# Patient Record
Sex: Male | Born: 1995 | Race: White | Hispanic: No | Marital: Single | Smoking: Former smoker
Health system: Southern US, Community
[De-identification: ages and names within clinical notes are randomized; demographics above are authoritative.]

## PROBLEM LIST (undated history)

## (undated) DIAGNOSIS — J302 Other seasonal allergic rhinitis: Secondary | ICD-10-CM

## (undated) DIAGNOSIS — J45909 Unspecified asthma, uncomplicated: Secondary | ICD-10-CM

## (undated) DIAGNOSIS — T7840XA Allergy, unspecified, initial encounter: Secondary | ICD-10-CM

## (undated) DIAGNOSIS — F419 Anxiety disorder, unspecified: Secondary | ICD-10-CM

## (undated) HISTORY — DX: Allergy, unspecified, initial encounter: T78.40XA

## (undated) HISTORY — DX: Unspecified asthma, uncomplicated: J45.909

## (undated) HISTORY — DX: Other seasonal allergic rhinitis: J30.2

## (undated) HISTORY — PX: OTHER SURGICAL HISTORY: SHX169

---
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
## 2014-02-04 ENCOUNTER — Ambulatory Visit: Payer: Self-pay | Admitting: Family Medicine

## 2014-02-17 ENCOUNTER — Encounter: Payer: Self-pay | Admitting: Family Medicine

## 2014-03-19 ENCOUNTER — Encounter: Payer: Self-pay | Admitting: Family Medicine

## 2014-07-01 ENCOUNTER — Ambulatory Visit: Payer: Self-pay | Admitting: Neurology

## 2014-07-07 ENCOUNTER — Encounter: Payer: Self-pay | Admitting: *Deleted

## 2014-07-08 ENCOUNTER — Encounter: Payer: Self-pay | Admitting: Neurology

## 2014-07-08 ENCOUNTER — Ambulatory Visit (INDEPENDENT_AMBULATORY_CARE_PROVIDER_SITE_OTHER): Payer: 59 | Admitting: Neurology

## 2014-07-08 VITALS — BP 118/80 | HR 72 | Ht 72.0 in | Wt 146.2 lb

## 2014-07-08 DIAGNOSIS — M25532 Pain in left wrist: Secondary | ICD-10-CM

## 2014-07-08 DIAGNOSIS — M79601 Pain in right arm: Secondary | ICD-10-CM

## 2014-07-08 DIAGNOSIS — M79602 Pain in left arm: Secondary | ICD-10-CM

## 2014-07-08 DIAGNOSIS — Z79899 Other long term (current) drug therapy: Secondary | ICD-10-CM

## 2014-07-08 DIAGNOSIS — M25531 Pain in right wrist: Secondary | ICD-10-CM

## 2014-07-08 NOTE — Patient Instructions (Signed)
1.  Stop gabapentin  2.  We will send a referral to Dr. Hulan Saas for ?tendonitis 3.  Recommend you take a a break from playing guitar and video games 4.  Return to clinic as needed

## 2014-07-08 NOTE — Progress Notes (Signed)
Note faxed.

## 2014-07-08 NOTE — Progress Notes (Signed)
[facility] Clinic Note - Initial Visit   Date: 07/08/2014  ALPHA MYSLIWIEC MRN: [id number] DOB: 1996-04-21   Dear Dr. Melrose Nakayama:  Thank you for your kind referral of Stuart Harper for consultation of bilateral arm pain. Although his history is well known to you, please allow [name] to reiterate it for the purpose of our medical record. The patient was accompanied to the clinic by self.    History of Present Illness: Stuart Harper is a 18 y.o. right-handed Caucasian male with history of seasonal allergies presenting for evaluation of bilateral arm pain, worse on the left.    Late March 2015, he woke up with pain in his left arm around the antecubital fossa, which was exacerbated in elbow flexion, such as when playing video games or guitar.  Over the time, he developed the same symptoms on the right hand.  He tried to avoid these activities, but there was no improvement.  [facility] wrist splint and given prednisone taper with no relief.  He started taking ibuprofen daily.  He was also orthopaedics at [facility] whose clinical exam was normal.  He complains of dull pain over the fingers and wrist, which is all worse when doing repetitive motion (video games).  Rest improves pain.  There is no associated numbness/tingling, muscle stiffness, cramping, or neck pain.  He feels that his hands have become weaker and needs to be careful when manipulating things.  Heat and ice provided temporary relief.  Prior to symptom onset, he was previously playing video games for 3-4 hours on average every other day and playing the guitar every Sunday and Thursday (1.5 hr).   Over the summer, he started working out again and did not notice any worsening of symptoms.  In June, he was started on gabapentin for trial by Dr. Tamala Julian (ortho). In September, he started seeing Dr. Melrose Nakayama for these symptoms.  He had extensive  neurological work-up including EMG of the upper extremities, MRI brain (per patient), and MRI cervical spine which did not show any abnormalities.  He was given diclofenac which has provided the most relief, but he continues to have low grade pain daily.   Out-side paper records, electronic medical record, and images have been reviewed where available and summarized as:  EMG 05/28/2014: Normal electrodiagnostic testing of bilateral upper extremities.  02/05/19/15 X-rays wrist left: No abnormalities  Labs CRP <0.1, RA <7.0, ANA negative ESR 2   Past Medical History  Diagnosis Date  . Seasonal allergies     Past Surgical History  Procedure Laterality Date  . None       Medications:  Current Outpatient Prescriptions on File Prior to Visit  Medication Sig Dispense Refill  . cetirizine (ZYRTEC) 5 MG tablet Take 5 mg by mouth daily.      . diclofenac (CATAFLAM) 50 MG tablet Take 50 mg by mouth 3 (three) times daily.      . fluticasone (FLONASE) 50 MCG/ACT nasal spray Place into both nostrils daily.      Marland Kitchen gabapentin (NEURONTIN) 100 MG capsule Take 100 mg by mouth 2 (two) times daily.       No current facility-administered medications on file prior to visit.    Allergies: No Known Allergies  Family History: Family History  Problem Relation Age of Onset  . Allergies Mother   . Asthma Mother   . Hypertension Father   . Allergies Father   .  Allergies Sister   . Asthma Sister   . Osteoarthritis Father     Social History: History   Social History  . Marital Status: Single    Spouse Name: N/A    Number of Children: N/A  . Years of Education: N/A   Occupational History  . Not on file.   Social History Main Topics  . Smoking status: Never Smoker   . Smokeless tobacco: Never Used  . Alcohol Use: No  . Drug Use: No  . Sexual Activity: Not on file   Other Topics Concern  . Not on file   Social History Narrative   Lives with parents in 2 story home.     Student at  [employer], sophomore. 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             Review of Systems:  CONSTITUTIONAL: No fevers, chills, night sweats, or weight loss.   EYES: No visual changes or eye pain ENT: No hearing changes.  No history of nose bleeds.   RESPIRATORY: No cough, wheezing and shortness of breath.   CARDIOVASCULAR: Negative for chest pain, and palpitations.   GI: Negative for abdominal discomfort, blood in stools or black stools.  No recent change in bowel habits.   GU:  No history of incontinence.   MUSCLOSKELETAL: +history of joint pain or swelling.  No myalgias.   SKIN: Negative for lesions, rash, and itching.   HEMATOLOGY/ONCOLOGY: Negative for prolonged bleeding, bruising easily, and swollen nodes.  No history of cancer.   ENDOCRINE: Negative for cold or heat intolerance, polydipsia or goiter.   PSYCH:  No depression or anxiety symptoms.   NEURO: As Above.   Vital Signs:  BP 118/80  Pulse 72  Ht 6' (1.829 m)  Wt 146 lb 3 oz (66.31 kg)  BMI 19.82 kg/m2  SpO2 95%   General Medical Exam:   General:  Well appearing, comfortable.   Eyes/ENT: see cranial nerve examination.   Neck: No masses appreciated.  Full range of motion without tenderness.  No carotid bruits. Respiratory:  Clear to auscultation, good air entry bilaterally.   Cardiac:  Regular rate and rhythm, no murmur.     Extremities:  No deformities, edema, or skin discoloration. Good capillary refill.   Skin:  Skin color, texture, turgor normal. No rashes or lesions.  Neurological Exam: MENTAL STATUS including orientation to time, place, person, recent and remote memory, attention span and concentration, language, and fund of knowledge is normal.  Speech is not dysarthric.  CRANIAL NERVES: II:  No visual field defects.  Unremarkable fundi.   III-IV-VI: Pupils equal round and reactive to light.  Normal conjugate, extra-ocular eye movements in all  directions of gaze.  No nystagmus.  No ptosis.   V:  Normal facial sensation.   VII:  Normal facial symmetry and movements.   VIII:  Normal hearing and vestibular function.   IX-X:  Normal palatal movement.   XI:  Normal shoulder shrug and head rotation.   XII:  Normal tongue strength and range of motion, no deviation or fasciculation.  MOTOR:  No atrophy, fasciculations or abnormal movements.  No pronator drift.  Tone is normal.    Right Upper Extremity:    Left Upper Extremity:    Deltoid  5/5   Deltoid  5/5   Biceps  5/5   Biceps  5/5   Triceps  5/5   Triceps  5/5  Wrist extensors  5/5   Wrist extensors  5/5   Wrist flexors  5/5   Wrist flexors  5/5   Finger extensors  5/5   Finger extensors  5/5   Finger flexors  5/5   Finger flexors  5/5   Dorsal interossei  5/5   Dorsal interossei  5/5   Abductor pollicis  5/5   Abductor pollicis  5/5   Tone (Ashworth scale)  0  Tone (Ashworth scale)  0   Right Lower Extremity:    Left Lower Extremity:    Hip flexors  5/5   Hip flexors  5/5   Hip extensors  5/5   Hip extensors  5/5   Knee flexors  5/5   Knee flexors  5/5   Knee extensors  5/5   Knee extensors  5/5   Dorsiflexors  5/5   Dorsiflexors  5/5   Plantarflexors  5/5   Plantarflexors  5/5   Toe extensors  5/5   Toe extensors  5/5   Toe flexors  5/5   Toe flexors  5/5   Tone (Ashworth scale)  0  Tone (Ashworth scale)  0   MSRs:  Right                                                                 Left brachioradialis 2+  brachioradialis 2+  biceps 2+  biceps 2+  triceps 2+  triceps 2+  patellar 2+  patellar 2+  ankle jerk 2+  ankle jerk 2+  Hoffman no  Hoffman no  plantar response down  plantar response down   SENSORY:  Normal and symmetric perception of light touch, pinprick, vibration, and proprioception.  Romberg's sign absent.   COORDINATION/GAIT: Normal finger-to- nose-finger and heel-to-shin. Gait narrow based and stable. Tandem and stressed gait intact.     IMPRESSION: Mr. Yabut is a 18 year-old male presenting for second opinion regarding 39-month history of bilateral arm/wrist pain.  His neurological examination is entirely normal and non-focal.  I have personally reviewed his prior work-up including EMG of the upper extremities, MRI brain, and MRI-cervical spine which has been normal.  Additionally, serology testing including ESR, CRP, ANA, and RF has been negative.    Based on his history and exam, there is no evidence that his symptoms are neurological in origin.  The fact that symptoms are alleviated slightly with NSAIDs does suggest possible inflammatory component and given that there is worsening of pain with repetitive overuse of his hands/wrist, I would like for him to be evaluated by Dr. Hulan Saas for possible tendonitis and/or musculoskeletal ultrasound.  Going forward, he may benefit from rheumatology evaluation if his diagnosis remains elusive.   PLAN/RECOMMENDATIONS:  1.  Stop gabapentin 2.  We will send a referral to Dr. Hulan Saas for ?tendonitis 3.  Recommend taking a break from video games or other activities that exacerbate pain 4.  Return to clinic as needed    The duration of this appointment visit was 40 minutes of face-to-face time with the patient.  Greater than 50% of this time was spent in counseling, explanation of diagnosis, planning of further management, and coordination of care.   Thank you for allowing me to participate in patient's care.  If I can  answer any additional questions, I would be pleased to do so.    Sincerely,    Donika K. Posey Pronto, DO

## 2014-07-14 ENCOUNTER — Encounter: Payer: Self-pay | Admitting: Family Medicine

## 2014-07-14 ENCOUNTER — Other Ambulatory Visit (INDEPENDENT_AMBULATORY_CARE_PROVIDER_SITE_OTHER): Payer: Commercial Managed Care - PPO

## 2014-07-14 ENCOUNTER — Ambulatory Visit (INDEPENDENT_AMBULATORY_CARE_PROVIDER_SITE_OTHER): Payer: Commercial Managed Care - PPO | Admitting: Family Medicine

## 2014-07-14 VITALS — BP 112/74 | HR 69 | Ht 72.0 in | Wt 142.0 lb

## 2014-07-14 DIAGNOSIS — M79642 Pain in left hand: Secondary | ICD-10-CM

## 2014-07-14 DIAGNOSIS — G5612 Other lesions of median nerve, left upper limb: Secondary | ICD-10-CM

## 2014-07-14 DIAGNOSIS — M62838 Other muscle spasm: Secondary | ICD-10-CM

## 2014-07-14 DIAGNOSIS — J302 Other seasonal allergic rhinitis: Secondary | ICD-10-CM | POA: Insufficient documentation

## 2014-07-14 LAB — COMPREHENSIVE METABOLIC PANEL
ALT: 27 U/L (ref 0–53)
AST: 19 U/L (ref 0–37)
Albumin: 4.1 g/dL (ref 3.5–5.2)
Alkaline Phosphatase: 51 U/L — ABNORMAL LOW (ref 52–171)
BUN: 13 mg/dL (ref 6–23)
CALCIUM: 9.1 mg/dL (ref 8.4–10.5)
CHLORIDE: 103 meq/L (ref 96–112)
CO2: 26 mEq/L (ref 19–32)
CREATININE: 0.9 mg/dL (ref 0.4–1.5)
GFR: 120.68 mL/min (ref >60.00)
Glucose, Bld: 72 mg/dL (ref 70–99)
Potassium: 4.1 mEq/L (ref 3.5–5.1)
Sodium: 138 mEq/L (ref 135–145)
Total Bilirubin: 0.7 mg/dL (ref 0.2–1.2)
Total Protein: 7.6 g/dL (ref 6.0–8.3)

## 2014-07-14 LAB — TSH: TSH: 1.59 u[IU]/mL (ref 0.40–5.00)

## 2014-07-14 LAB — T4, FREE: FREE T4: 1.16 ng/dL (ref 0.60–1.60)

## 2014-07-14 LAB — IRON: IRON: 66 ug/dL (ref 42–165)

## 2014-07-14 LAB — FERRITIN: FERRITIN: 48 ng/mL (ref 22.0–322.0)

## 2014-07-14 LAB — VITAMIN D 25 HYDROXY (VIT D DEFICIENCY, FRACTURES): VITD: 38.58 ng/mL (ref 30.00–100.00)

## 2014-07-14 NOTE — Patient Instructions (Signed)
Good to meet you Limiting to about an hour daily of guitar and video games would be a good idea. Ice then afterward for about 20 minutes and do stretches afterward. Anterior interosseous nerve impingement.  We will get labs to rule out eletrolytes disturbance.  Consider turmeric 500mg  twice daily to help with the scarring around the nerve Arnica topically may help as well or try the pennsaid I am giving you.  See me again in 3 weeks.

## 2014-07-14 NOTE — Assessment & Plan Note (Signed)
I believe the patient's pain could be contributing to an anterior interosseous nerve impingement. We discussed home exercises and icing protocol. We discussed the possibility of bracing which patient declined. Patient will try topical anti-inflammatories. We discussed over-the-counter medications that could be beneficial. Due to the chronicity of this problem we also elected to get labs to rule out any electrolyte disturbances that could be contributing. We discussed limiting the repetitive motions seem to exacerbate the problem. Patient is to try these interventions and come back and see me again in 3 weeks for further evaluation. Patient did not have any improvement with the gabapentin so we discussed stopping entirely.

## 2014-07-14 NOTE — Progress Notes (Signed)
[facility] [address], [place] [address] Phone: [phone] Subjective:    I'm seeing this patient by the request  of:  Dr. Posey Pronto  CC: Wrist pain  Stuart Harper is a 18 y.o. male coming in with complaint of bilateral arm pain. Worse on left.  Patient states that he has had this pain for proximally 7 months. Patient states it seems to be mostly at the antecubital fossa worse with elbow flexion especially when doing repetitive motion such as playing video games or guitar. Patient states he started compensating and using his right hand more and unfortunately continued to have the problem as well as worse on the contralateral side. Patient has been given prednisone previously by another provider with no significant improvement. Patient states it's better when he does not do certain activities such as playing video games. Patient denies any numbness or tingling or any cramping that he notes as well as denies any neck pain. He does state that after doing repetitive motion he can feel like his hand may be a little bit weaker and may not have the dexterity as he doesn't contralateral side. Heat and ice does help some. Patient was started on a medication called gabapentin which did not seem to make any significant improvement. Patient is also had a true full neurologic workup including an EMG as well as an MRI of the brain he states. Patient did have an MRI of the cervical spine which did not show any significant abnormalities. Patient has been on a low dose anti-inflammatory with minimal improvement as well. Patient was seen by Dr. Posey Pronto recently and was stopped on the gabapentin and discussed decreasing the activities that seem to exacerbate the problem. Patient was sent here for further evaluation. Patient ranks the discomfort is 6 out of 10 in severity.  Out-side paper records, electronic medical record, and images have been reviewed where  available and summarized as:  EMG 05/28/2014: Normal electrodiagnostic testing of bilateral upper extremities.  02/05/19/15 X-rays wrist left: No abnormalities  Labs CRP <0.1, RA <7.0, ANA negative ESR 2      Past medical history, social, surgical and family history all reviewed in electronic medical record.   Review of Systems: No headache, visual changes, nausea, vomiting, diarrhea, constipation, dizziness, abdominal pain, skin rash, fevers, chills, night sweats, weight loss, swollen lymph nodes, body aches, joint swelling, muscle aches, chest pain, shortness of breath, mood changes.   Objective Blood pressure 112/74, pulse 69, height 6' (1.829 m), weight 142 lb (64.411 kg), SpO2 99.00%.  General: No apparent distress alert and oriented x3 mood and affect normal, dressed appropriately.  HEENT: Pupils equal, extraocular movements intact  Respiratory: Patient's speak in full sentences and does not appear short of breath  Cardiovascular: No lower extremity edema, non tender, no erythema  Skin: Warm dry intact with no signs of infection or rash on extremities or on axial skeleton.  Abdomen: Soft nontender  Neuro: Cranial nerves II through XII are intact, neurovascularly intact in all extremities with 2+ DTRs and 2+ pulses.  Lymph: No lymphadenopathy of posterior or anterior cervical chain or axillae bilaterally.  Gait normal with good balance and coordination.  MSK:  Non tender with full range of motion and good stability and symmetric strength and tone of shoulders,   hip, knee and ankles bilaterally.  Wrist: Left Inspection normal with no visible erythema or swelling. ROM smooth and normal with good flexion and extension and ulnar/radial deviation  that is symmetrical with opposite wrist. Palpation is normal over metacarpals, navicular, lunate, and TFCC; tendons without tenderness/ swelling No snuffbox tenderness. No tenderness over Canal of Guyon. Strength 5/5 in all directions without  pain. Negative Finkelstein, tinel's and phalens. Negative Watson's test. Mild discomfort approximately middle third on the ventral aspect of the forearm but no palpable mass appreciated. Contralateral wrist unremarkable.  Elbow: Left  Mild valgus deformity bilaterally. Range of motion full pronation, supination, flexion, extension. Strength is full to all of the above directions Stable to varus, valgus stress. Negative moving valgus stress test. No discrete areas of tenderness to palpation. Ulnar nerve does not sublux. Negative cubital tunnel Tinel's. Contralateral elbow unremarkable  Musculoskeletal ultrasound was performed and interpreted by Charlann Boxer D.O.   Elbow: Left Lateral epicondyle and common extensor tendon origin visualized.  No edema, effusions, or avulsions seen.  Radial head unremarkable and located in annular ligament Medial epicondyle and common flexor tendon origin visualized.  No edema, effusions, or avulsions seen. Ulnar nerve in cubital tunnel unremarkable. Olecranon and triceps insertion visualized and unremarkable without edema, effusion, or avulsion.  No signs olecranon bursitis. Surrounding patient's anterior interosseous nerve there seems to be some thickening of the fascia as well as increasing Doppler flow but no true tear appreciated in the musculature. Median nerves completely unremarkable. 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

## 2014-07-15 ENCOUNTER — Telehealth: Payer: Self-pay | Admitting: *Deleted

## 2014-07-15 LAB — CALCIUM, IONIZED: CALCIUM ION: 1.27 mmol/L (ref 1.12–1.32)

## 2014-07-15 NOTE — Telephone Encounter (Signed)
Left msg on triage needing to get help setting up his mychart it will not let him long on. Also wanting results on his labs. Called pt back gave him # to [facility], also gave md response on his labs...Stuart Harper

## 2014-08-05 ENCOUNTER — Other Ambulatory Visit (INDEPENDENT_AMBULATORY_CARE_PROVIDER_SITE_OTHER): Payer: Commercial Managed Care - PPO

## 2014-08-05 ENCOUNTER — Ambulatory Visit (INDEPENDENT_AMBULATORY_CARE_PROVIDER_SITE_OTHER): Payer: Commercial Managed Care - PPO | Admitting: Family Medicine

## 2014-08-05 ENCOUNTER — Encounter: Payer: Self-pay | Admitting: Family Medicine

## 2014-08-05 VITALS — BP 130/80 | HR 80 | Ht 72.0 in | Wt 140.0 lb

## 2014-08-05 DIAGNOSIS — G5612 Other lesions of median nerve, left upper limb: Secondary | ICD-10-CM

## 2014-08-05 NOTE — Patient Instructions (Signed)
Good to see you Ice is better then heat but ok.  Continue the exercises and stretching see me again in 2 weeks and if other side still giving you trouble we can do an injection.

## 2014-08-05 NOTE — Assessment & Plan Note (Signed)
Patient was given an injection today and tolerated the procedure very well. I am optimistic that this will be helpful. Patient continues to have difficulty I would consider further workup including anything such as B12 deficiency, thyroid disease, parathyroid hormone, or other reasons for muscle fatigue such as I GG antibodies. We will discuss further at follow-up in 2 weeks. If this does help his left side we may need to consider doing an injection on the right side as well.

## 2014-08-05 NOTE — Progress Notes (Signed)
[facility] [name], [place] [address] Phone: [phone] Subjective:     CC: Wrist pain Follow-up DPO:[id number] Stuart Harper is a 18 y.o. male coming in with complaint of bilateral arm pain. Patient's was seen previously and there was a potential for an anterior interosseous nerve impingement that was occurring. Patient states that actually overall he has been doing the exercises fairly regularly as well as doing some compression with minimal benefit. Patient states he has maybe been able to go somewhat longer when he is pointing guitar or videogames but continues to have the muscle fatigue that stops him from playing an approximate 1-2 hours. Denies any significant numbness. Does not affect him during his other activities. Patient states he was trying to do some compression at night which did cause some swelling. Patient stop the gabapentin on his own. Denies any new symptoms.     Past medical history significant for injury:  Patient is also had a true full neurologic workup including an EMG as well as an MRI of the brain he states. Patient did have an MRI of the cervical spine which did not show any significant abnormalities.   Out-side paper records, electronic medical record, and images have been reviewed where available and summarized as:  EMG 05/28/2014: Normal electrodiagnostic testing of bilateral upper extremities.  02/05/19/15 X-rays wrist left: No abnormalities  Labs CRP <0.1, RA <7.0, ANA negative ESR 2     Past medical history, social, surgical and family history all reviewed in electronic medical record.   Review of Systems: No headache, visual changes, nausea, vomiting, diarrhea, constipation, dizziness, abdominal pain, skin rash, fevers, chills, night sweats, weight loss, swollen lymph nodes, body aches, joint swelling, muscle aches, chest pain, shortness of breath, mood changes.   Objective Blood pressure 130/80, pulse 80,  height 6' (1.829 m), weight 140 lb (63.504 kg), SpO2 96 %.  General: No apparent distress alert and oriented x3 mood and affect normal, dressed appropriately.  HEENT: Pupils equal, extraocular movements intact  Respiratory: Patient's speak in full sentences and does not appear short of breath  Cardiovascular: No lower extremity edema, non tender, no erythema  Skin: Warm dry intact with no signs of infection or rash on extremities or on axial skeleton.  Abdomen: Soft nontender  Neuro: Cranial nerves II through XII are intact, neurovascularly intact in all extremities with 2+ DTRs and 2+ pulses.  Lymph: No lymphadenopathy of posterior or anterior cervical chain or axillae bilaterally.  Gait normal with good balance and coordination.  MSK:  Non tender with full range of motion and good stability and symmetric strength and tone of shoulders,   hip, knee and ankles bilaterally.  Wrist: Left Inspection normal with no visible erythema or swelling. ROM smooth and normal with good flexion and extension and ulnar/radial deviation that is symmetrical with opposite wrist. Palpation is normal over metacarpals, navicular, lunate, and TFCC; tendons without tenderness/ swelling No snuffbox tenderness. No tenderness over Canal of Guyon. Strength 5/5 in all directions without pain. Negative Finkelstein, tinel's and phalens. Negative Watson's test. Mild discomfort approximately middle third on the ventral aspect of the forearm but no palpable mass appreciated. Contralateral wrist unremarkable.  Elbow: Left  Mild valgus deformity bilaterally. Range of motion full pronation, supination, flexion, extension. Strength is full to all of the above directions Stable to varus, valgus stress. Negative moving valgus stress test. No discrete areas of tenderness to palpation. Ulnar nerve does not sublux. Negative cubital tunnel  Tinel's. Contralateral elbow unremarkable No significant change from previous  exam   Procedure: Real-time Ultrasound Guided Injection of anterior interosseous nerve Device: GE Logiq E  Ultrasound guided injection is preferred based studies that show increased duration, increased effect, greater accuracy, decreased procedural pain, increased response rate, and decreased cost with ultrasound guided versus blind injection.  Verbal informed consent obtained.  Time-out conducted.  Noted no overlying erythema, induration, or other signs of local infection.  Skin prepped in a sterile fashion.  Local anesthesia: Topical Ethyl chloride.  With sterile technique and under real time ultrasound guidance:  With the 25-gauge 1 inch needle was injected with 0.5 mL of 0.5% Marcaine and 0.5 mL of 40 mg/dL of Kenalog into the neural sheath under ultrasound guidance. Completed without difficulty  Pain immediately resolved suggesting accurate placement of the medication.  Advised to call if fevers/chills, erythema, induration, drainage, or persistent bleeding.  Images permanently stored and available for review in the ultrasound unit.  Impression: Technically successful ultrasound guided injection.    Impression and Recommendations:     This case required medical decision making of moderate complexity.

## 2014-08-21 ENCOUNTER — Ambulatory Visit (INDEPENDENT_AMBULATORY_CARE_PROVIDER_SITE_OTHER): Payer: Commercial Managed Care - PPO | Admitting: Family Medicine

## 2014-08-21 ENCOUNTER — Encounter: Payer: Self-pay | Admitting: Family Medicine

## 2014-08-21 VITALS — BP 96/62 | HR 77 | Ht 72.0 in | Wt 138.0 lb

## 2014-08-21 DIAGNOSIS — G5612 Other lesions of median nerve, left upper limb: Secondary | ICD-10-CM

## 2014-08-21 NOTE — Patient Instructions (Signed)
Good to see you Lets work on the strength New exercises for the wrist.  Brace the right side at night.  Ice still after activity Lets give it another 4 weeks and see how we are doing.  You are doing well.

## 2014-08-21 NOTE — Progress Notes (Signed)
[name] [address] [place], [place] [address] Phone: [phone] Subjective:     CC: Wrist pain Follow-up CBU:LAGTXMIWOE Stuart Harper is a 18 y.o. male coming in with complaint of bilateral arm pain. Patient's was seen previously and there was a potential for an anterior interosseous nerve impingement.  Patient's pain seeming to be more on the left side. Patient was given a steroid injection. Patient states that this did decrease his pain by approximately 90%. Patient started playing more video games because he is not having pain. Patient having similar symptoms now on the right. Patient states that this seems to be mild overall. Denies any weakness or numbness. Patient states it is not stopping him from any activity. Patient states that he does have some mild fatigue when he is playing his guitar but overall is doing better.     Past medical history significant for injury:  Patient is also had a true full neurologic workup including an EMG as well as an MRI of the brain he states. Patient did have an MRI of the cervical spine which did not show any significant abnormalities.   Out-side paper records, electronic medical record, and images have been reviewed where available and summarized as:  EMG 05/28/2014: Normal electrodiagnostic testing of bilateral upper extremities.  02/05/19/15 X-rays wrist left: No abnormalities  Labs CRP <0.1, RA <7.0, ANA negative ESR 2     Past medical history, social, surgical and family history all reviewed in electronic medical record.   Review of Systems: No headache, visual changes, nausea, vomiting, diarrhea, constipation, dizziness, abdominal pain, skin rash, fevers, chills, night sweats, weight loss, swollen lymph nodes, body aches, joint swelling, muscle aches, chest pain, shortness of breath, mood changes.   Objective Blood pressure 96/62, pulse 77, height 6' (1.829 m), weight 138 lb (62.596 kg), SpO2 98 %.    General: No apparent distress alert and oriented x3 mood and affect normal, dressed appropriately.  HEENT: Pupils equal, extraocular movements intact  Respiratory: Patient's speak in full sentences and does not appear short of breath  Cardiovascular: No lower extremity edema, non tender, no erythema  Skin: Warm dry intact with no signs of infection or rash on extremities or on axial skeleton.  Abdomen: Soft nontender  Neuro: Cranial nerves II through XII are intact, neurovascularly intact in all extremities with 2+ DTRs and 2+ pulses.  Lymph: No lymphadenopathy of posterior or anterior cervical chain or axillae bilaterally.  Gait normal with good balance and coordination.  MSK:  Non tender with full range of motion and good stability and symmetric strength and tone of shoulders,   hip, knee and ankles bilaterally.  Wrist: Left Inspection normal with no visible erythema or swelling. ROM smooth and normal with good flexion and extension and ulnar/radial deviation that is symmetrical with opposite wrist. Palpation is normal over metacarpals, navicular, lunate, and TFCC; tendons without tenderness/ swelling No snuffbox tenderness. No tenderness over Canal of Guyon. Strength 5/5 in all directions without pain. Negative Finkelstein, tinel's and phalens. Negative Watson's test. No discomfort noted today.   Elbow: Left  Mild valgus deformity bilaterally. Range of motion full pronation, supination, flexion, extension. Strength is full to all of the above directions Stable to varus, valgus stress. Negative moving valgus stress test. No discrete areas of tenderness to palpation. Ulnar nerve does not sublux. Negative cubital tunnel Tinel's. Contralateral elbow unremarkable       Impression and Recommendations:     This case required  medical decision making of moderate complexity.

## 2014-08-21 NOTE — Assessment & Plan Note (Signed)
Patient improved after the injection. Patient overall is continuing to improve on the left side. Patient is having some residual pain on the right side. Encourage patient to try bracing and some of the conservative therapy for this contralateral side. We discussed stretches and exercises and strengthening of the wrist which can also be beneficial. We discussed trying to limit time on plain which I do not think will be possible for patient. Patient and will come back and see me again in 4 weeks. Continuing to have discomfort we may try an injection on the contralateral side.

## 2014-09-17 ENCOUNTER — Encounter: Payer: Self-pay | Admitting: Family Medicine

## 2014-09-17 ENCOUNTER — Ambulatory Visit (INDEPENDENT_AMBULATORY_CARE_PROVIDER_SITE_OTHER): Payer: Commercial Managed Care - PPO | Admitting: Family Medicine

## 2014-09-17 VITALS — BP 108/66 | HR 62 | Ht 72.0 in | Wt 141.0 lb

## 2014-09-17 DIAGNOSIS — G5612 Other lesions of median nerve, left upper limb: Secondary | ICD-10-CM

## 2014-09-17 NOTE — Progress Notes (Signed)
  [name] [address] [place], [place] [address] Phone: [phone] Subjective:     CC: Wrist pain Follow-up UUE:KCMKLKJZPH Stuart Harper is a 18 y.o. male coming in with complaint of bilateral arm pain. Patient's was seen previously and there was a potential for an anterior interosseous nerve impingement.  Patient's pain seeming to be more on the left side. Patient was given a steroid injection. Patient did have improvement after that. Patient though is starting to have worsening pain again. Patient went to a chiropractor for myofascial release. Patient states that this seemed to aggravate it more. Still only having pain when he plays video games or plays his guitar.   Past medical history significant for injury:  Patient is also had a true full neurologic workup including an EMG as well as an MRI of the brain he states. Patient did have an MRI of the cervical spine which did not show any significant abnormalities.   Out-side paper records, electronic medical record, and images have been reviewed where available and summarized as:  EMG 05/28/2014: Normal electrodiagnostic testing of bilateral upper extremities.  02/05/19/15 X-rays wrist left: No abnormalities  Labs CRP <0.1, RA <7.0, ANA negative ESR 2     Past medical history, social, surgical and family history all reviewed in electronic medical record.   Review of Systems: No headache, visual changes, nausea, vomiting, diarrhea, constipation, dizziness, abdominal pain, skin rash, fevers, chills, night sweats, weight loss, swollen lymph nodes, body aches, joint swelling, muscle aches, chest pain, shortness of breath, mood changes.   Objective Blood pressure 108/66, pulse 62, weight 141 lb (63.957 kg), SpO2 99 %.  General: No apparent distress alert and oriented x3 mood and affect normal, dressed appropriately.  HEENT: Pupils equal, extraocular movements intact  Respiratory: Patient's speak in full  sentences and does not appear short of breath  Cardiovascular: No lower extremity edema, non tender, no erythema  Skin: Warm dry intact with no signs of infection or rash on extremities or on axial skeleton.  Abdomen: Soft nontender  Neuro: Cranial nerves II through XII are intact, neurovascularly intact in all extremities with 2+ DTRs and 2+ pulses.  Lymph: No lymphadenopathy of posterior or anterior cervical chain or axillae bilaterally.  Gait normal with good balance and coordination.  MSK:  Non tender with full range of motion and good stability and symmetric strength and tone of shoulders,   hip, knee and ankles bilaterally.  Wrist: Left Inspection normal with no visible erythema or swelling. ROM smooth and normal with good flexion and extension and ulnar/radial deviation that is symmetrical with opposite wrist. Palpation is normal over metacarpals, navicular, lunate, and TFCC; tendons without tenderness/ swelling No snuffbox tenderness. No tenderness over Canal of [place]. Strength 5/5 in all directions without pain. Negative Finkelstein, tinel's and phalens. Negative Watson's test. No discomfort noted today on exam and contralateral shoulder and elbow are unremarkable.     Impression and Recommendations:     This case required medical decision making of moderate complexity.

## 2014-09-17 NOTE — Assessment & Plan Note (Signed)
I do think that this is likely improper positioning with somebody playing guitar. I do think that this could be contribute in. Discussed also that improper diet could be planning a role and we discussed protein supplementation. We discussed icing regimen as well as continuing the home exercising. We discussed not playing video games and the guitar at the same time. Patient will try to make these changes and we discussed possibly finding someone to help with positioning of the guitar. Patient is very understanding. Patient will come back in 4 weeks to further evaluate.

## 2014-09-17 NOTE — Patient Instructions (Signed)
Good to see you Ice still is  Your friend Exercises still are great. Only 3 times a week.  Try holding guitar in more neutral position with wrist. Try to use the fingers more on the pads.  Increase protein by 20 grams daily wither supplement or more fish.  I will call you if I can find someone to help Otherwise see me in 4 weeks.

## 2014-10-15 ENCOUNTER — Ambulatory Visit (INDEPENDENT_AMBULATORY_CARE_PROVIDER_SITE_OTHER): Payer: Commercial Managed Care - PPO | Admitting: Family Medicine

## 2014-10-15 ENCOUNTER — Encounter: Payer: Self-pay | Admitting: Family Medicine

## 2014-10-15 VITALS — BP 104/80 | HR 63 | Ht 72.0 in | Wt 144.0 lb

## 2014-10-15 DIAGNOSIS — G5612 Other lesions of median nerve, left upper limb: Secondary | ICD-10-CM

## 2014-10-15 NOTE — Patient Instructions (Signed)
[email or web address] for lessons and making sure you are holding it correctly.  Go over the exercises with jodi today.  Keep it up you have made great progress.  See me again in 6 weeks.     Anterior Interosseous Nerve Syndrome  Anterior interosseous nerve syndrome is a nerve disorder in the elbow and upper arm. Anterior interosseous nerve syndrome causes pain and weakness in the hand and forearm. The cause of the symptoms is the compression of a branch of the median nerve by muscles or ligament-type tissues. This compression causes the pain and weakness. Anterior interosseous nerve syndrome may negatively affect performance in sports that require pinching of the thumb and index fingers. Since the branch of the median nerve does not supply sensation to the skin, there is no numbness associated with the disorder. SYMPTOMS   Vague pain in the upper forearm.  Pinching your thumb tip to index fingertip (the "OK" sign) becomes difficult.  Weakness in the thumb and index finger, particularly bending the thumb.  Difficulty writing.  Frequently dropping objects grasped by the hand.  Weakness when turning the palm down against resistance. CAUSES   Pressure on the anterior interosseous nerve at the forearm caused by swollen, inflamed or scarred tissue, ligament-like tissue, or pressure between muscles of the forearm.  A virus may also be causing inflammation and dysfunction of the nerve. RISK INCREASES WITH:   Repetitive and strenuous movements (especially wrist and hand rotation) of the forearm and wrist (rowing, weightlifting, body building, tennis, squash, racquetball, carpentry).  Poor strength and flexibility.  Inadequate warm-up prior to physical activity.  Diabetes mellitus.  Underactive thyroid gland (hypothyroidism). PREVENTIVE MEASURES  Warm up and stretch before physical activity.  Maintain physical fitness  Flexibility (especially in the wrist, forearm, and  elbow).  Muscle strength.  Muscular endurance.  Cardiovascular fitness.  Knowing and using proper technique and correcting improper technique. PROGNOSIS  If treated properly, anterior interosseous nerve syndrome is usually curable. In some cases the condition may resolve spontaneously; however, on occasion surgery is required. Spontaneous recovery has been observed between 3 and 18 months after the onset of symptoms. POSSIBLE COMPLICATIONS  Permanent weakness or paralysis of the thumb or index finger in the affected hand.  If usual activities are resumed too early, the healing process may be prolonged. GENERAL TREATMENT CONSIDERATIONS To treat anterior interosseous nerve syndrome initially, activities that cause pain should be discontinued and medications and ice should be used to reduce inflammation. It is important to begin stretching and strengthening exercises of the forearm and elbow. A caregiver may also give a referral for physical therapy if necessary. If these treatments are insufficient for healing, surgery may be necessary. The surgery is typically an outpatient procedure (allowing you to return home the same day) and provides almost complete pain relief. Surgery will typically be offered if pain and weakness subsist for 8 weeks to 1 year after the onset of symptoms. MEDICATION  If pain medication is necessary, nonsteroidal anti-inflammatory medications, such as aspirin and ibuprofen, or other minor pain relievers, such as acetaminophen, are often recommended.  Do not take pain medication for 7 days before surgery.  Prescription pain relievers are usually prescribed only after surgery. Use only as directed and only as much as you need. HEAT AND COLD:  Cold treatment (icing) relieves pain and reduces inflammation. Cold treatment should be applied for 10 to 15 minutes every 2 to 3 hours for inflammation and pain and immediately after any activity that aggravates your symptoms.  Use  ice packs or an ice massage.  Heat treatment may be used prior to performing the stretching and strengthening activities prescribed by your caregiver, physical therapist, or athletic trainer. Use a heat pack or a warm soak. SEEK MEDICAL CARE IF:   Symptoms get worse or do not improve in 8 weeks despite treatment.  Pain, numbness, or coldness is felt in the hand. SEEK IMMEDIATE MEDICAL CARE IF:   Fingernails appear blue, gray, or dark in color.  Any of the following occur after surgery.  Increased pain, swelling, redness, drainage, or bleeding in the surgical area.  Signs of infection (headache, muscle aches, dizziness, or a general ill feeling with fever).  New, unexplained symptoms develop (drugs used in treatment may produce side effects). Document Released: 04/06/2005 Document Revised: 11/28/2011 Document Reviewed: 12/18/2008 [facility] Patient Information 2015 [place], [place]. This information is not intended to replace advice given to you by your health care provider. Make sure you discuss any questions you have with your health care provider.

## 2014-10-15 NOTE — Progress Notes (Signed)
  [facility] [address] , [place] [address] Phone: [phone] Subjective:     CC: Wrist pain Follow-up HPI:Subjective Stuart Harper is a 18 y.o. male coming in with complaint of bilateral arm pain. Patient's was seen previously and there was a potential for an anterior interosseous nerve impingement.  Patient states both sides is feeling better. Patient has been able to play D.O. games as well as a guitar longer for approximate 1 hour at a time. Patient usually does try to take a day off in between sessions. Patient states though that he is happy with these results. Not taking any medications. No longer having any numbness he was having in the fingers previously.   Past medical history significant for injury:  Patient is also had a true full neurologic workup including an EMG as well as an MRI of the brain he states. Patient did have an MRI of the cervical spine which did not show any significant abnormalities.   Out-side paper records, electronic medical record, and images have been reviewed where available and summarized as:  EMG 05/28/2014: Normal electrodiagnostic testing of bilateral upper extremities.  02/05/19/15 X-rays wrist left: No abnormalities  Labs CRP <0.1, RA <7.0, ANA negative ESR 2     Past medical history, social, surgical and family history all reviewed in electronic medical record.   Review of Systems: No headache, visual changes, nausea, vomiting, diarrhea, constipation, dizziness, abdominal pain, skin rash, fevers, chills, night sweats, weight loss, swollen lymph nodes, body aches, joint swelling, muscle aches, chest pain, shortness of breath, mood changes.   Objective Blood pressure 104/80, pulse 63, height 6' (1.829 m), weight 144 lb (65.318 kg), SpO2 95 %.  General: No apparent distress alert and oriented x3 mood and affect normal, dressed appropriately.  HEENT: Pupils equal, extraocular movements intact  Respiratory:  Patient's speak in full sentences and does not appear short of breath  Cardiovascular: No lower extremity edema, non tender, no erythema  Skin: Warm dry intact with no signs of infection or rash on extremities or on axial skeleton.  Abdomen: Soft nontender  Neuro: Cranial nerves II through XII are intact, neurovascularly intact in all extremities with 2+ DTRs and 2+ pulses.  Lymph: No lymphadenopathy of posterior or anterior cervical chain or axillae bilaterally.  Gait normal with good balance and coordination.  MSK:  Non tender with full range of motion and good stability and symmetric strength and tone of shoulders,   hip, knee and ankles bilaterally.  Wrist: Left Inspection normal with no visible erythema or swelling. ROM smooth and normal with good flexion and extension and ulnar/radial deviation that is symmetrical with opposite wrist. Palpation is normal over metacarpals, navicular, lunate, and TFCC; tendons without tenderness/ swelling No snuffbox tenderness. No tenderness over Canal of Guyon. Strength 5/5 in all directions without pain. Negative Finkelstein, tinel's and phalens. Negative Watson's test. No discomfort noted today on exam and contralateral shoulder and elbow are unremarkable. No significant change from previous exam  Procedure note 97110; 15 minutes spent for Therapeutic exercises as stated in above notes.  This included exercises focusing on stretching, strengthening, with significant focus on eccentric aspects.   Proper technique shown and discussed handout in great detail with ATC.  All questions were discussed and answered.      Impression and Recommendations:     This case required medical decision making of moderate complexity.     

## 2014-10-15 NOTE — Assessment & Plan Note (Signed)
Patient is doing better at this time. Encourage patient to do exercises and start strength training, worked with Product/process development scientist. Encourage patient to continue to start try to remain active. We discussed different diet changes as well. His lungs patient continues doing well he will follow-up with me again in 6 weeks.

## 2014-11-24 ENCOUNTER — Ambulatory Visit: Payer: Commercial Managed Care - PPO | Admitting: Family Medicine

## 2015-02-19 ENCOUNTER — Other Ambulatory Visit: Payer: Self-pay | Admitting: Family Medicine

## 2015-02-22 ENCOUNTER — Other Ambulatory Visit: Payer: Self-pay | Admitting: Family Medicine

## 2015-04-27 ENCOUNTER — Telehealth: Payer: Self-pay | Admitting: Family Medicine

## 2015-04-27 DIAGNOSIS — Z139 Encounter for screening, unspecified: Secondary | ICD-10-CM

## 2015-04-27 NOTE — Telephone Encounter (Signed)
We don't do allergy testing here, but if she likes we can refer to [facility] [address].

## 2015-04-27 NOTE — Telephone Encounter (Signed)
Mom called wanting to ask you if her son could be tested for shell fish allergies.  He is going out of the country for study abroad.  Please call her back.    Thanks Con Memos

## 2015-04-27 NOTE — Telephone Encounter (Signed)
Stuart Harper wanted to know if there is a blood test that could be done to test for shell fish allergies?

## 2015-04-29 DIAGNOSIS — Z139 Encounter for screening, unspecified: Secondary | ICD-10-CM | POA: Insufficient documentation

## 2015-04-29 NOTE — Telephone Encounter (Signed)
Linda notified. 

## 2015-04-29 NOTE — Telephone Encounter (Signed)
OK to do blood test, however skin tests done by allergist are more accurate than the blood test.  Have printed lab order and is ready to pick up at front desk. Does not need to be fasting.

## 2015-05-07 LAB — ALLERGEN PROFILE, SHELLFISH
Clam IgE: 0.4 kU/L — AB
F023-IgE Crab: 8.36 kU/L — AB
F080-IGE LOBSTER: 6.85 kU/L — AB
F290-IGE OYSTER: 0.38 kU/L — AB
Scallop IgE: 0.38 kU/L — AB
Shrimp IgE: 9.21 kU/L — AB

## 2015-05-13 ENCOUNTER — Other Ambulatory Visit: Payer: Self-pay | Admitting: Family Medicine

## 2015-05-13 MED ORDER — EPINEPHRINE 0.3 MG/0.3ML IJ SOAJ: 0.3000 mg | Freq: Once | INTRAMUSCULAR | Status: DC

## 2015-05-18 ENCOUNTER — Other Ambulatory Visit: Payer: Self-pay | Admitting: Family Medicine

## 2015-09-12 ENCOUNTER — Other Ambulatory Visit: Payer: Self-pay | Admitting: Family Medicine

## 2015-09-23 ENCOUNTER — Other Ambulatory Visit: Payer: Self-pay | Admitting: Family Medicine

## 2016-05-26 ENCOUNTER — Encounter: Payer: Self-pay | Admitting: Family Medicine

## 2016-05-26 ENCOUNTER — Ambulatory Visit (INDEPENDENT_AMBULATORY_CARE_PROVIDER_SITE_OTHER): Payer: Commercial Managed Care - PPO | Admitting: Family Medicine

## 2016-05-26 VITALS — BP 118/80 | HR 66 | Temp 98.1°F | Resp 16 | Wt 145.0 lb

## 2016-05-26 DIAGNOSIS — J45909 Unspecified asthma, uncomplicated: Secondary | ICD-10-CM | POA: Insufficient documentation

## 2016-05-26 DIAGNOSIS — L209 Atopic dermatitis, unspecified: Secondary | ICD-10-CM | POA: Insufficient documentation

## 2016-05-26 DIAGNOSIS — R112 Nausea with vomiting, unspecified: Secondary | ICD-10-CM

## 2016-05-26 NOTE — Progress Notes (Signed)
       Patient: Stuart Harper Male    DOB: 01-May-1996   20 y.o.   MRN: [id number] Visit Date: 05/26/2016  Today's Provider: Lelon Huh, MD   Chief Complaint  Patient presents with  . Nausea   Subjective:    Patient stated that over the last 10 days he has woken up every morning feeling nauseous and fatigued. Also has symptoms of episodes of vomiting, diarrhea, stomach pain, headaches and lightheadedness. Patient stated he feels tried and drained all day.     No abdominal pain. Eating sometimes makes nausea worse. No recent travel. Going to school at [employer]. Has been feeling stressed with school work. No diarrhea for the last week. No GI illnesses going around at school or home.   Wt Readings from Last 3 Encounters:  05/26/16 145 lb (65.8 kg)  10/15/14 144 lb (65.3 kg)  09/17/14 141 lb (64 kg)     No Known Allergies   Current Outpatient Prescriptions:  .  Cetirizine HCl (ZYRTEC ALLERGY) 10 MG CAPS, Take 1 tablet by mouth daily., Disp: , Rfl:  .  fluticasone (FLONASE) 50 MCG/ACT nasal spray, USE 1 SPRAY EACH NOSTRIL AS NEEDED, Disp: 16 g, Rfl: 2 .  valACYclovir (VALTREX) 1000 MG tablet, TAKE 2 TABLETS BY MOUTH TWICE A DAY AS NEEDED, Disp: 8 tablet, Rfl: 5  Review of Systems  Constitutional: Positive for fatigue. Negative for appetite change, chills and fever.  Respiratory: Negative for chest tightness, shortness of breath and wheezing.   Cardiovascular: Negative for chest pain and palpitations.  Gastrointestinal: Positive for abdominal pain, diarrhea, nausea and vomiting.  Neurological: Positive for light-headedness and headaches.    Social History  Substance Use Topics  . Smoking status: Never Smoker  . Smokeless tobacco: Never Used  . Alcohol use No   Objective:   BP 118/80 (BP Location: Right Arm, Patient Position: Sitting, Cuff Size: Normal)   Pulse 66   Temp 98.1 F (36.7 C) (Oral)   Resp 16   Wt 145 lb (65.8 kg)   SpO2 98%   BMI 19.67 kg/m    Physical Exam  General Appearance:    Alert, cooperative, no distress  Eyes:    PERRL, conjunctiva/corneas clear, EOM's intact       Lungs:     Clear to auscultation bilaterally, respirations unlabored  Heart:    Regular rate and rhythm  Abdomen:   bowel sounds present and normal in all 4 quadrants, soft, round, nontender or nondistended. No CVA tenderness        Assessment & Plan:     1. Non-intractable vomiting with nausea, vomiting of unspecified type May have been set of by GI virus and aggravated by stress. Need to rule out liver, gallbladder, kidney and metabolic disorders.   - Comprehensive metabolic panel - Amylase - CBC with Differential/Platelet - H Pylori, IGM, IGG, IGA AB       Lelon Huh, MD  [facility]

## 2016-05-27 ENCOUNTER — Telehealth: Payer: Self-pay | Admitting: Family Medicine

## 2016-05-27 NOTE — Telephone Encounter (Signed)
Pt is calling to request lab results.  CB#[phone]/MW

## 2016-05-27 NOTE — Telephone Encounter (Signed)
Please advise results? 

## 2016-05-28 ENCOUNTER — Telehealth: Payer: Self-pay | Admitting: Family Medicine

## 2016-05-28 DIAGNOSIS — R112 Nausea with vomiting, unspecified: Secondary | ICD-10-CM

## 2016-05-28 LAB — COMPREHENSIVE METABOLIC PANEL
ALK PHOS: 53 IU/L (ref 39–117)
ALT: 17 IU/L (ref 0–44)
AST: 17 IU/L (ref 0–40)
Albumin/Globulin Ratio: 2 (ref 1.2–2.2)
Albumin: 5.1 g/dL (ref 3.5–5.5)
BUN/Creatinine Ratio: 10 (ref 9–20)
BUN: 9 mg/dL (ref 6–20)
Bilirubin Total: 0.9 mg/dL (ref 0.0–1.2)
CALCIUM: 10 mg/dL (ref 8.7–10.2)
CO2: 25 mmol/L (ref 18–29)
CREATININE: 0.86 mg/dL (ref 0.76–1.27)
Chloride: 99 mmol/L (ref 96–106)
GFR calc Af Amer: 143 mL/min/{1.73_m2} (ref >59)
GFR, EST NON AFRICAN AMERICAN: 124 mL/min/{1.73_m2} (ref >59)
GLUCOSE: 100 mg/dL — AB (ref 65–99)
Globulin, Total: 2.5 g/dL (ref 1.5–4.5)
POTASSIUM: 4.3 mmol/L (ref 3.5–5.2)
Sodium: 140 mmol/L (ref 134–144)
Total Protein: 7.6 g/dL (ref 6.0–8.5)

## 2016-05-28 LAB — H PYLORI, IGM, IGG, IGA AB: H Pylori IgG: <0.9 U/mL (ref 0.0–0.8)

## 2016-05-28 LAB — CBC WITH DIFFERENTIAL/PLATELET
Basophils Absolute: 0 10*3/uL (ref 0.0–0.2)
Basos: 0 %
EOS (ABSOLUTE): 0.1 10*3/uL (ref 0.0–0.4)
EOS: 2 %
HEMATOCRIT: 46 % (ref 37.5–51.0)
Hemoglobin: 16.3 g/dL (ref 12.6–17.7)
IMMATURE GRANULOCYTES: 0 %
Immature Grans (Abs): 0 10*3/uL (ref 0.0–0.1)
LYMPHS: 27 %
Lymphocytes Absolute: 1.1 10*3/uL (ref 0.7–3.1)
MCH: 30 pg (ref 26.6–33.0)
MCHC: 35.4 g/dL (ref 31.5–35.7)
MCV: 85 fL (ref 79–97)
MONOS ABS: 0.4 10*3/uL (ref 0.1–0.9)
Monocytes: 9 %
NEUTROS PCT: 62 %
Neutrophils Absolute: 2.5 10*3/uL (ref 1.4–7.0)
PLATELETS: 141 10*3/uL — AB (ref 150–379)
RBC: 5.44 x10E6/uL (ref 4.14–5.80)
RDW: 12.6 % (ref 12.3–15.4)
WBC: 4 10*3/uL (ref 3.4–10.8)

## 2016-05-28 LAB — AMYLASE: Amylase: 96 U/L (ref 31–124)

## 2016-05-28 NOTE — Telephone Encounter (Signed)
Patient called for labs results done on Thursday which I did advise him of those.  However, he is really not any better.  Wakes up every morning very nauseated and vomiting.  This has been going on since Aug.29th and he wants a call back today  as to what he can do. Sounds very discouraged about this.   [phone]

## 2016-05-28 NOTE — Telephone Encounter (Signed)
See result note.  

## 2016-05-30 ENCOUNTER — Other Ambulatory Visit: Payer: Self-pay | Admitting: Family Medicine

## 2016-05-30 DIAGNOSIS — R1115 Cyclical vomiting syndrome unrelated to migraine: Secondary | ICD-10-CM

## 2016-05-30 NOTE — Telephone Encounter (Signed)
Unable to reach pt or leave message. Did leave vm on on his mother Linda's vm requesting cb.

## 2016-05-30 NOTE — Telephone Encounter (Signed)
He needs to be scheduled for abdominal ultrasound for evaluation of persistent nausea and vomiting. If he develops any pain or fever he will need to go to ER.

## 2016-05-31 ENCOUNTER — Ambulatory Visit
Admission: RE | Admit: 2016-05-31 | Discharge: 2016-05-31 | Disposition: A | Discharge status: Home / Self Care | Payer: Commercial Managed Care - PPO | Source: Ambulatory Visit | Attending: Family Medicine | Admitting: Family Medicine

## 2016-05-31 DIAGNOSIS — R1115 Cyclical vomiting syndrome unrelated to migraine: Secondary | ICD-10-CM

## 2016-05-31 DIAGNOSIS — G43A0 Cyclical vomiting, not intractable: Secondary | ICD-10-CM | POA: Insufficient documentation

## 2016-06-01 ENCOUNTER — Ambulatory Visit: Payer: Commercial Managed Care - PPO

## 2016-06-03 DIAGNOSIS — F930 Separation anxiety disorder of childhood: Secondary | ICD-10-CM | POA: Insufficient documentation

## 2016-06-03 DIAGNOSIS — F1210 Cannabis abuse, uncomplicated: Secondary | ICD-10-CM | POA: Insufficient documentation

## 2016-06-03 DIAGNOSIS — F418 Other specified anxiety disorders: Secondary | ICD-10-CM | POA: Insufficient documentation

## 2016-06-03 NOTE — Telephone Encounter (Signed)
Pt advised; he reports feeling some better with the Clonazepam.  The Doctor at Urgent care is working on referring him to a Social worker.  He says he hasn't not heard back from them yet about an appointment.  I advised him to call us back if he doesn't hear from them soon.   Thanks,   -Mickel Baas

## 2016-06-03 NOTE — Telephone Encounter (Signed)
Received copy of ultrasound report which is normal. No cause of nausea. I see doctor from urgent care prescribed medication for anxiety which could be causing symptoms. If not feeling better with anti-anxiety medications then he needs referral to GI.

## 2016-06-03 NOTE — Telephone Encounter (Signed)
I was about to call patient back but I see he did have [name] of abdomen done on 05/31/16. I am not sure who advised him of this but message was still in here. Have you had a chance to look at his result? -aa

## 2016-06-03 NOTE — Telephone Encounter (Signed)
Pt stated that he received a call this morning and was returning the call. Please advise. Thanks TNP

## 2016-08-14 ENCOUNTER — Other Ambulatory Visit: Payer: Self-pay | Admitting: Family Medicine

## 2016-08-15 NOTE — Telephone Encounter (Signed)
See refill request.

## 2016-10-04 ENCOUNTER — Encounter: Payer: Self-pay | Admitting: Family Medicine

## 2016-10-04 DIAGNOSIS — R110 Nausea: Secondary | ICD-10-CM

## 2016-10-07 NOTE — Telephone Encounter (Signed)
Please refer Gi for chronic nausea and vomiting. Thanks

## 2016-10-24 ENCOUNTER — Other Ambulatory Visit: Payer: Self-pay | Admitting: *Deleted

## 2016-10-28 ENCOUNTER — Ambulatory Visit: Payer: Commercial Managed Care - PPO | Admitting: Family Medicine

## 2016-10-31 ENCOUNTER — Ambulatory Visit: Payer: Commercial Managed Care - PPO | Admitting: Gastroenterology

## 2016-10-31 ENCOUNTER — Encounter: Payer: Self-pay | Admitting: Family Medicine

## 2016-11-01 ENCOUNTER — Telehealth: Payer: Self-pay | Admitting: Family Medicine

## 2016-11-01 NOTE — Telephone Encounter (Signed)
You will need to call [facility] at  [phone] to reschedule your appointment with Dr. Vicente Males. You should not require another referral or authorization for this. have a great day.   Dr. Caryn Section

## 2016-12-03 IMAGING — US US ABDOMEN LIMITED
1 series · 14 of 25 positions shown · non-contrast
Comparison: None.

CLINICAL DATA: Non intractable cyclical vomiting.

EXAM:
US ABDOMEN LIMITED - RIGHT UPPER QUADRANT

[Series 1: us abdomen limited · 0.17mm/px · 14 of 41 slices shown]
[im 1/41]
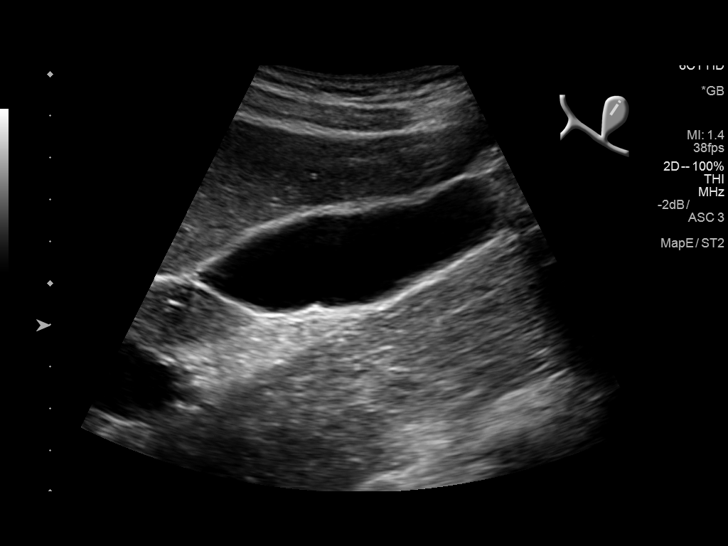
[im 4/41]
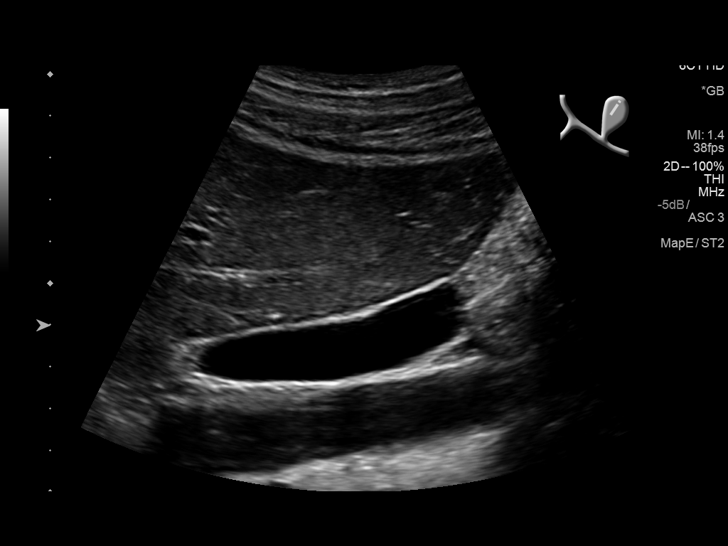
[im 7/41]
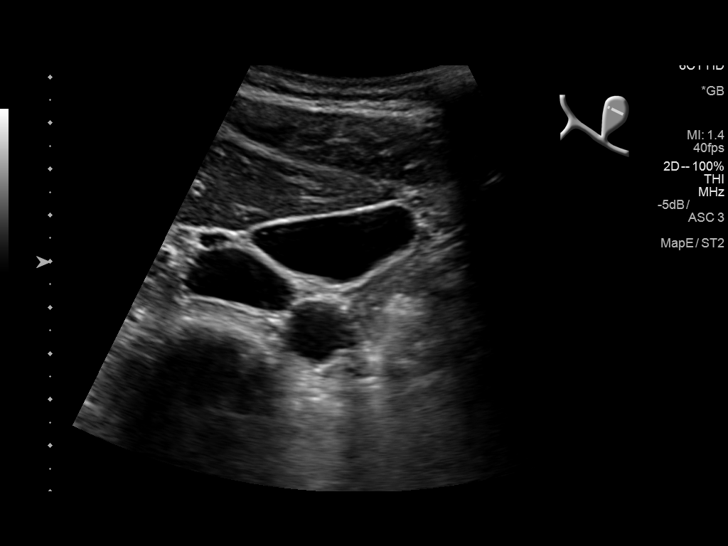
[im 11/41]
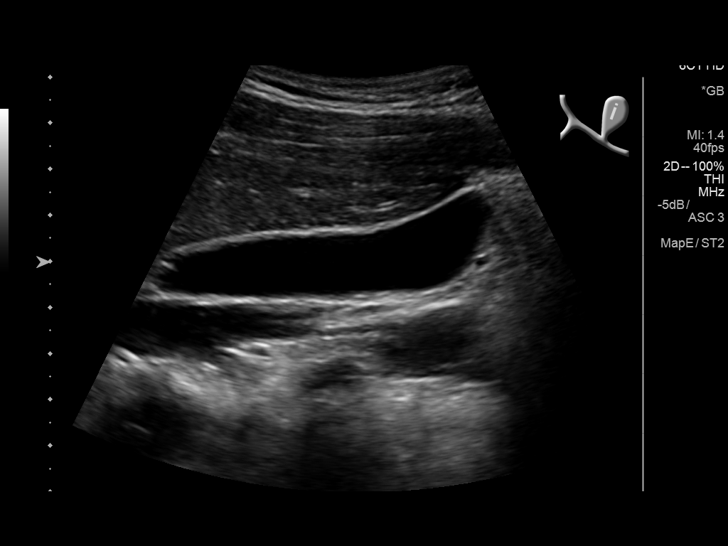
[im 14/41]
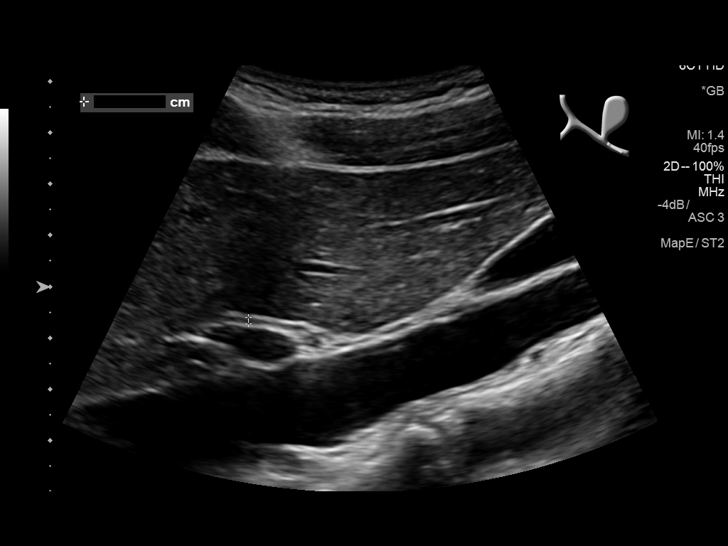
[im 16/41]
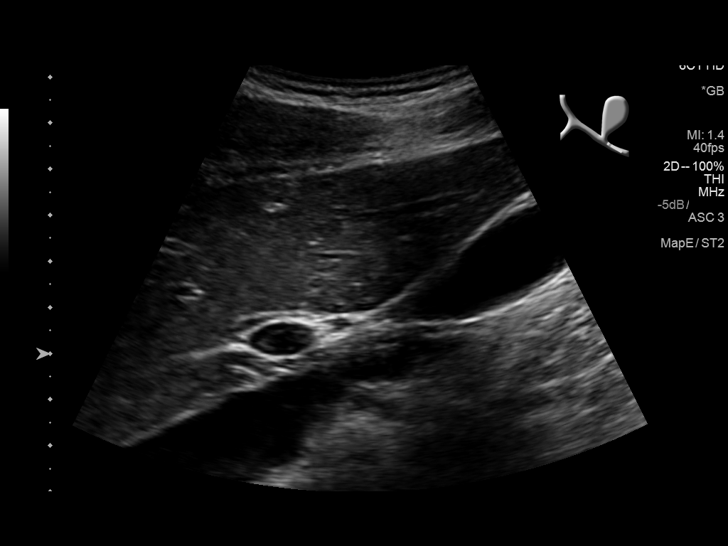
[im 19/41]
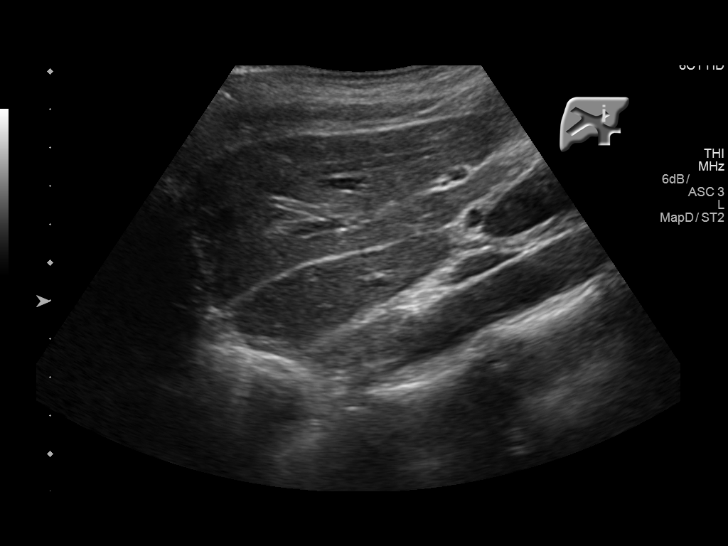
[im 22/41]
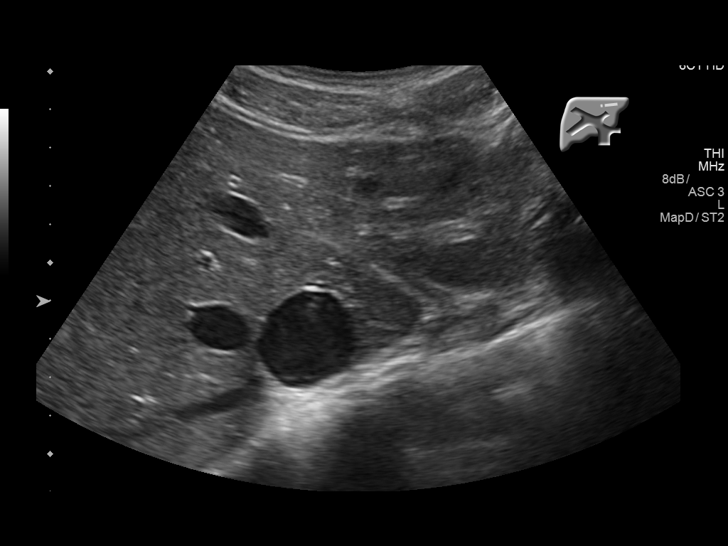
[im 26/41]
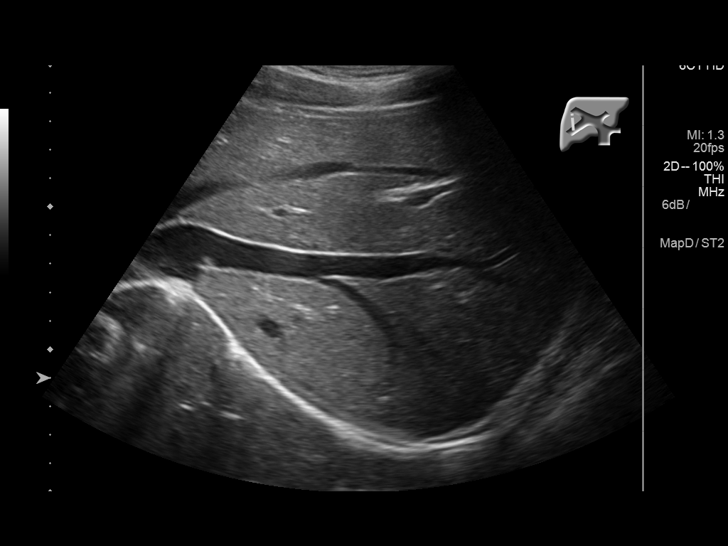
[im 27/41]
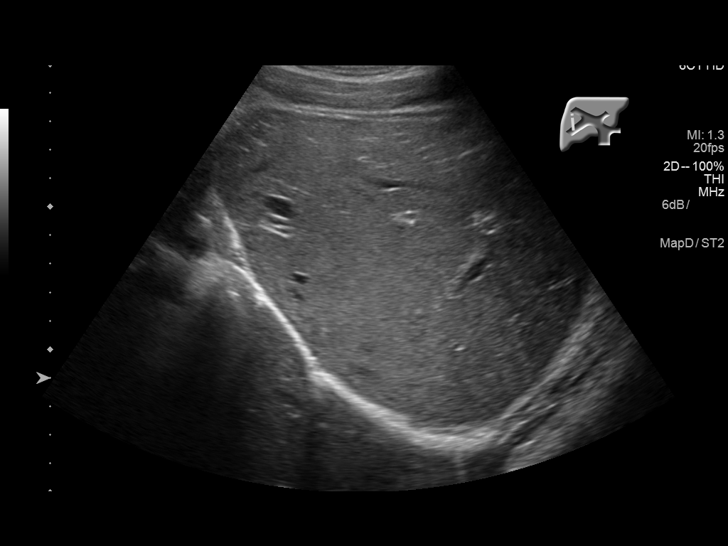
[im 31/41]
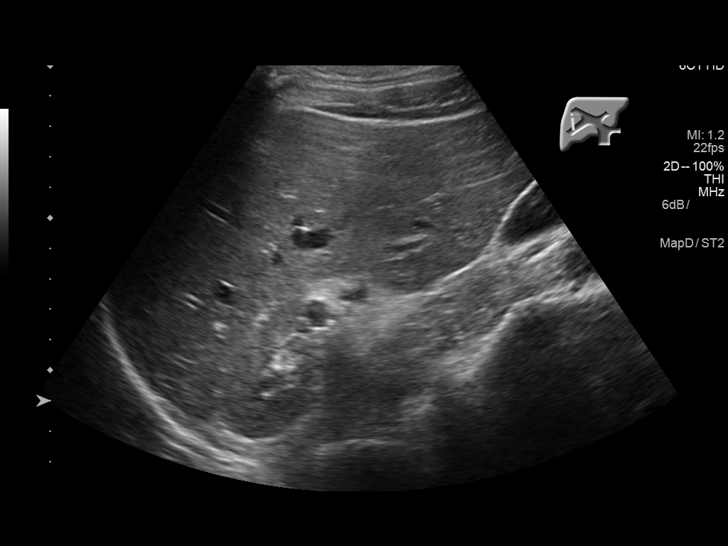
[im 34/41]
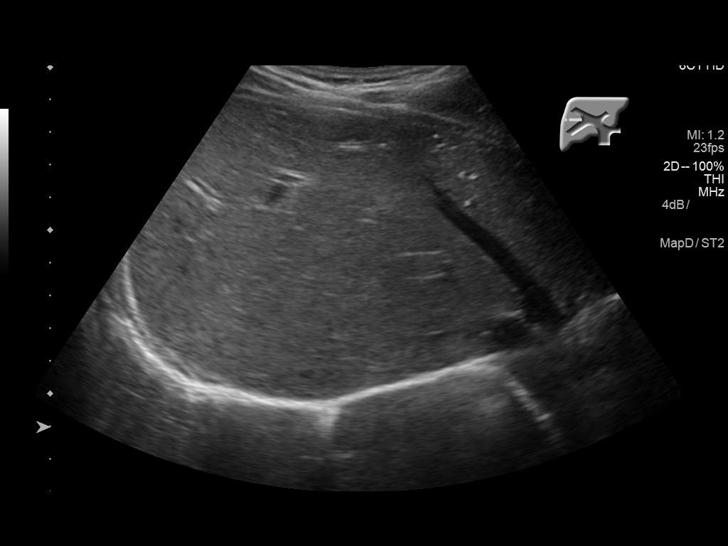
[im 37/41]
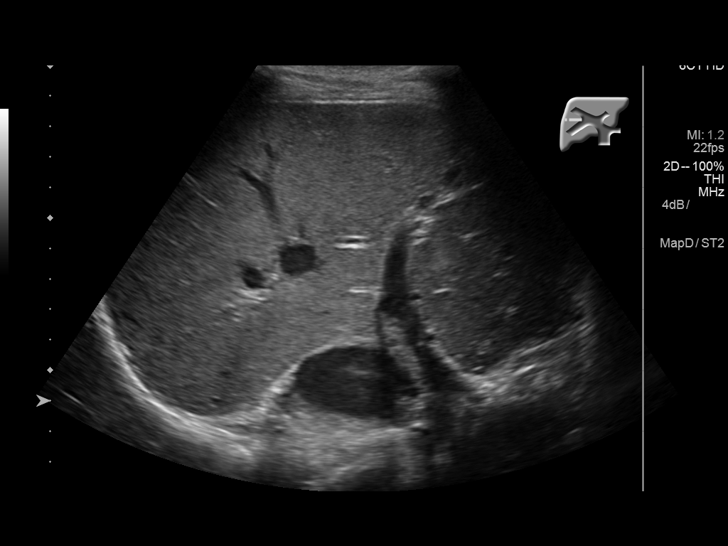
[im 41/41]
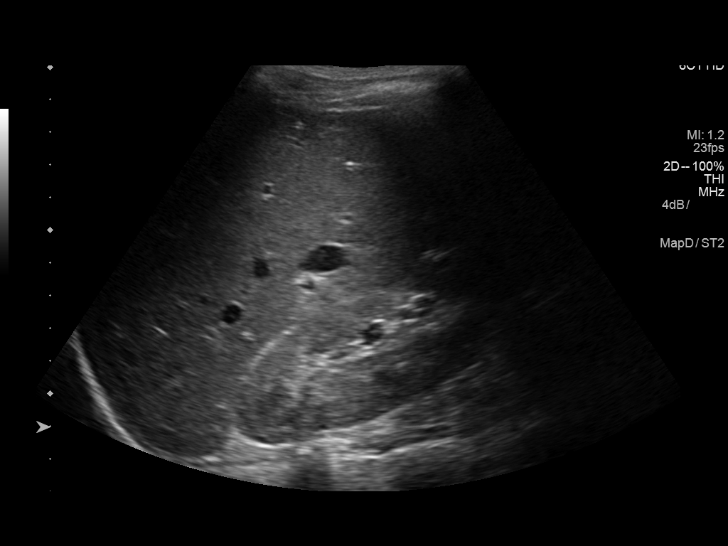

[14 of 25 positions shown; findings below may reference images not displayed]

FINDINGS: Gallbladder:

No gallstones or wall thickening visualized. No sonographic Murphy
sign noted by sonographer.

Common bile duct:

Diameter: 1.2 mm

Liver:

No focal lesion identified. Within normal limits in parenchymal
echogenicity.
IMPRESSION: Negative exam.

## 2017-01-29 ENCOUNTER — Other Ambulatory Visit: Payer: Self-pay | Admitting: Family Medicine

## 2017-03-02 ENCOUNTER — Encounter: Payer: Self-pay | Admitting: Family Medicine

## 2017-03-02 ENCOUNTER — Ambulatory Visit (INDEPENDENT_AMBULATORY_CARE_PROVIDER_SITE_OTHER): Payer: Commercial Managed Care - PPO | Admitting: Family Medicine

## 2017-03-02 VITALS — BP 120/68 | HR 87 | Temp 98.1°F | Resp 16 | Wt 153.0 lb

## 2017-03-02 DIAGNOSIS — F331 Major depressive disorder, recurrent, moderate: Secondary | ICD-10-CM | POA: Diagnosis not present

## 2017-03-02 DIAGNOSIS — R110 Nausea: Secondary | ICD-10-CM

## 2017-03-02 DIAGNOSIS — A084 Viral intestinal infection, unspecified: Secondary | ICD-10-CM

## 2017-03-02 NOTE — Progress Notes (Signed)
Patient: Stuart Harper Male    DOB: [birth year]   21 y.o.   MRN: [id number] Visit Date: 03/02/2017  Today's Provider: Lelon Huh, MD   Chief Complaint  Patient presents with  . Vomiting   Subjective:    HPI  Nausea and Vomiting:  Patient comes in today reporting that he has have nausea and vomiting since yesterday, He has also had watery diarrhea since yesterday. Patient reports some abdominal cramping, fatigue and decrease in appetite. He has tried taking antacids which he reports was unable to keep down due to the vomiting. Patient denies any fever. Has had no vomiting today. Had Gatorade this morning and keeping down. Last BP was yesterday. No blood in stool. 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  He also presents for follow up of several other chronic problems today. He is traveling to [place] later this year and wants to make sure he is in good health before going. He has a history of depression and anxiety currently taking escitalopram and clonazepam, but not consistently. He feels these medications have been helpful, but he also want evaluation for attention deficit disorder. He is interested in establishing with local psychiatrist.    He also has long history of cyclic vomiting syndrome. It had been recommended for him to follow up with GI in the past but this was apparently never done. He would like to have GI evaluation before going to [place] in the later this year to make sure he does not have any more serious medical problems.     No Known Allergies   Current Outpatient Prescriptions:  .  Cetirizine HCl (ZYRTEC ALLERGY) 10 MG CAPS, Take 1 tablet by mouth daily., Disp: , Rfl:  .  clonazePAM (KLONOPIN) 0.5 MG tablet, Take 0.5 mg by mouth 2 (two) times daily as needed for anxiety., Disp: , Rfl:  .  escitalopram (LEXAPRO) 20 MG tablet, Take 20 mg by mouth daily., Disp: , Rfl: 2 .   fluticasone (FLONASE) 50 MCG/ACT nasal spray, USE 1 SPRAY IN EACH NOSTRIL AS NEEDED, Disp: 16 g, Rfl: 2 .  hydrOXYzine (ATARAX/VISTARIL) 25 MG tablet, Take 25 mg by mouth 2 (two) times daily as needed for anxiety., Disp: , Rfl:  .  triamcinolone cream (KENALOG) 0.1 %, APPLY TO RASH EXTERNALLY 2 TIMES A DAY, Disp: 60 g, Rfl: 1 .  valACYclovir (VALTREX) 1000 MG tablet, TAKE 2 TABLETS BY MOUTH TWICE A DAY AS NEEDED, Disp: 8 tablet, Rfl: 5  Review of Systems  Constitutional: Positive for fatigue. Negative for appetite change, chills and fever.  Respiratory: Negative for chest tightness, shortness of breath and wheezing.   Cardiovascular: Negative for chest pain and palpitations.  Gastrointestinal: Positive for abdominal pain, diarrhea, nausea and vomiting. Negative for anal bleeding, blood in stool and constipation.  Neurological: Negative for headaches.    Social History  Substance Use Topics  . Smoking status: Never Smoker  . Smokeless tobacco: Never Used  . Alcohol use No   Objective:   BP 120/68 (BP Location: Left Arm, Patient Position: Sitting, Cuff Size: Normal)   Pulse 87   Temp 98.1 F (36.7 C) (Oral)   Resp 16   Wt 153 lb (69.4 kg)   SpO2 97% Comment: room air  BMI 20.75 kg/m  Vitals:   03/02/17 1054  BP: 120/68  Pulse: 87  Resp: 16  Temp: 98.1  F (36.7 C)  TempSrc: Oral  SpO2: 97%  Weight: 153 lb (69.4 kg)     Physical Exam   General Appearance:    Alert, cooperative, no distress  Eyes:    PERRL, conjunctiva/corneas clear, EOM's intact       Lungs:     Clear to auscultation bilaterally, respirations unlabored  Heart:    Regular rate and rhythm  Neurologic:   Awake, alert, oriented x 3. No apparent focal neurological           defect.           Assessment & Plan:     1. Viral gastroenteritis Discussed natural history and dietary resrctions. He can take OTC Imodium for diarrhea. Call if not greatly improved within 24 hours. Work excuse given for today.    2. Chronic nausea Likely secondary to anxiety, but he request GI evaluation before leaving the country later this year.  - Ambulatory referral to Gastroenterology  3. Moderate episode of recurrent major depressive disorder ([place]) He also thinks he has ADD and requests ADD evaluation.  - Testosterone,Free and Total - TSH - Ambulatory referral to Psychiatry        Lelon Huh, MD  [facility]

## 2017-03-02 NOTE — Patient Instructions (Signed)
You can take OTC Kaopectate for diarrhea   Viral Gastroenteritis, Adult Viral gastroenteritis is also known as the stomach flu. This condition is caused by various viruses. These viruses can be passed from person to person very easily (are very contagious). This condition may affect your stomach, small intestine, and large intestine. It can cause sudden watery diarrhea, fever, and vomiting. Diarrhea and vomiting can make you feel weak and cause you to become dehydrated. You may not be able to keep fluids down. Dehydration can make you tired and thirsty, cause you to have a dry mouth, and decrease how often you urinate. Older adults and people with other diseases or a weak immune system are at higher risk for dehydration. It is important to replace the fluids that you lose from diarrhea and vomiting. If you become severely dehydrated, you may need to get fluids through an IV tube. What are the causes? Gastroenteritis is caused by various viruses, including rotavirus and norovirus. Norovirus is the most common cause in adults. You can get sick by eating food, drinking water, or touching a surface contaminated with one of these viruses. You can also get sick from sharing utensils or other personal items with an infected person. What increases the risk? This condition is more likely to develop in people:  Who have a weak defense system (immune system).  Who live with one or more children who are younger than 46 years old.  Who live in a nursing home.  Who go on cruise ships.  What are the signs or symptoms? Symptoms of this condition start suddenly 1-2 days after exposure to a virus. Symptoms may last a few days or as long as a week. The most common symptoms are watery diarrhea and vomiting. Other symptoms include:  Fever.  Headache.  Fatigue.  Pain in the abdomen.  Chills.  Weakness.  Nausea.  Muscle aches.  Loss of appetite.  How is this diagnosed? This condition is  diagnosed with a medical history and physical exam. You may also have a stool test to check for viruses or other infections. How is this treated? This condition typically goes away on its own. The focus of treatment is to restore lost fluids (rehydration). Your health care provider may recommend that you take an oral rehydration solution (ORS) to replace important salts and minerals (electrolytes) in your body. Severe cases of this condition may require giving fluids through an IV tube. Treatment may also include medicine to help with your symptoms. Follow these instructions at home: Follow instructions from your health care provider about how to care for yourself at home. Eating and drinking Follow these recommendations as told by your health care provider:  Take an ORS. This is a drink that is sold at pharmacies and retail stores.  Drink clear fluids in small amounts as you are able. Clear fluids include water, ice chips, diluted fruit juice, and low-calorie sports drinks.  Eat bland, easy-to-digest foods in small amounts as you are able. These foods include bananas, applesauce, rice, lean meats, toast, and crackers.  Avoid fluids that contain a lot of sugar or caffeine, such as energy drinks, sports drinks, and soda.  Avoid alcohol.  Avoid spicy or fatty foods.  General instructions   Drink enough fluid to keep your urine clear or pale yellow.  Wash your hands often. If soap and water are not available, use hand sanitizer.  Make sure that all people in your household wash their hands well and often.  Take  over-the-counter and prescription medicines only as told by your health care provider.  Rest at home while you recover.  Watch your condition for any changes.  Take a warm bath to relieve any burning or pain from frequent diarrhea episodes.  Keep all follow-up visits as told by your health care provider. This is important. Contact a health care provider if:  You cannot  keep fluids down.  Your symptoms get worse.  You have new symptoms.  You feel light-headed or dizzy.  You have muscle cramps. Get help right away if:  You have chest pain.  You feel extremely weak or you faint.  You see blood in your vomit.  Your vomit looks like coffee grounds.  You have bloody or black stools or stools that look like tar.  You have a severe headache, a stiff neck, or both.  You have a rash.  You have severe pain, cramping, or bloating in your abdomen.  You have trouble breathing or you are breathing very quickly.  Your heart is beating very quickly.  Your skin feels cold and clammy.  You feel confused.  You have pain when you urinate.  You have signs of dehydration, such as: ? Dark urine, very little urine, or no urine. ? Cracked lips. ? Dry mouth. ? Sunken eyes. ? Sleepiness. ? Weakness. This information is not intended to replace advice given to you by your health care provider. Make sure you discuss any questions you have with your health care provider. Document Released: 09/05/2005 Document Revised: 02/17/2016 Document Reviewed: 05/12/2015 Elsevier Interactive Patient Education  2017 [employer].

## 2017-03-16 ENCOUNTER — Ambulatory Visit (INDEPENDENT_AMBULATORY_CARE_PROVIDER_SITE_OTHER): Payer: Commercial Managed Care - PPO | Admitting: Family Medicine

## 2017-03-16 ENCOUNTER — Encounter: Payer: Self-pay | Admitting: Family Medicine

## 2017-03-16 VITALS — BP 110/70 | HR 89 | Temp 98.7°F | Resp 16 | Wt 146.6 lb

## 2017-03-16 DIAGNOSIS — L309 Dermatitis, unspecified: Secondary | ICD-10-CM

## 2017-03-16 DIAGNOSIS — K120 Recurrent oral aphthae: Secondary | ICD-10-CM

## 2017-03-16 DIAGNOSIS — T675XXA Heat exhaustion, unspecified, initial encounter: Secondary | ICD-10-CM | POA: Diagnosis not present

## 2017-03-16 NOTE — Patient Instructions (Signed)
Try hydrocortisone cream for your rashes. Continue with Gatorade and water when outside to keep up with fluids.

## 2017-03-16 NOTE — Progress Notes (Signed)
Subjective:     Patient ID: Stuart Harper, male   DOB: 15-Sep-1996, 21 y.o.   MRN: [id number]  HPI  Chief Complaint  Patient presents with  . Fatigue    Patient comes in office today to discuss symptoms of fatigue for the past 4 days. Patient reports that he does average 8hrs of sleep at night and denies falling asleep during the day or having to take frequent naps. Patient states that he has been under a lot of stress at the moment and wasnt sure if it was related.   . Oral Pain    Patient reports for the past 2 days that he has had pain around his gums and tongue for the past 2-3 days. Patient states that he has difficulty eating and drinking  . Sinus Problem    Patient reports possible rash on his right leg and left side of chest for the past 3 weeks, patient states that it appeared to have a white head but denies weeping or drainage.   Currently working outside as a Forensic scientist. Reports getting dehydrated after playing basketball outside. States he will be going to psychologist today, Stuart Harper, for ADD testing. Hx of situational stress treated with escitalopram. Denies use of clonazepam but does use marijuana. No recent dental exams.   Review of Systems     Objective:   Physical Exam  Constitutional: He appears well-developed and well-nourished. No distress.  HENT:  Oral apthous ulcer on right anterior pillar, Tongue is also tender to touch with small amount of white coating.  Skin:  Few discrete papules on his leg and chest c/w insect bites/contact dermatitis  Psychiatric:  Anxious/hypomanic       Assessment:    1. Heat exhaustion, initial encounter  2. Aphthous ulcer of mouth: Written rx for MVLB mixture 90 cc  3. Dermatitis: c/w contact dermatitis/insect bites    Plan:    Continue with Gatorade and water. Work excuse for today. Try hydrocortisone cream for rashes.

## 2017-03-27 ENCOUNTER — Telehealth: Payer: Self-pay | Admitting: Family Medicine

## 2017-03-27 NOTE — Telephone Encounter (Signed)
Pt is coming tomorrow to discuss anxiety and ADHD.  He had testing done by [name] and wants to know is we have recd the test results yet.  He was supposed to have faxed them late last week.  Pt just wants to see if Dr. Caryn Section has looked at the test results yet.  Thanks, [employer]

## 2017-03-28 ENCOUNTER — Ambulatory Visit (INDEPENDENT_AMBULATORY_CARE_PROVIDER_SITE_OTHER): Payer: Commercial Managed Care - PPO | Admitting: Family Medicine

## 2017-03-28 ENCOUNTER — Encounter: Payer: Self-pay | Admitting: Family Medicine

## 2017-03-28 VITALS — BP 102/76 | HR 93 | Temp 97.8°F | Resp 16 | Wt 148.0 lb

## 2017-03-28 DIAGNOSIS — F419 Anxiety disorder, unspecified: Secondary | ICD-10-CM

## 2017-03-28 NOTE — Progress Notes (Signed)
Patient: Stuart Harper Male    DOB: 1996/08/22   21 y.o.   MRN: [id number] Visit Date: 03/28/2017  Today's Provider: Lelon Huh, MD   Chief Complaint  Patient presents with  . Follow-up   Subjective:    HPI Follow up Anxiety:  Patient comes in today to discuss recent ADHD screening results that were done by his Psychiatrist  Dr. [name]. Patient states that his psychiatrist thinks that he may have an Anxiety issue instead of ADHD.  He was prescribed Lexapro from walk-in clinic last year. He thinks this has helped somewhat, but still feels very anxious and thinks he may have OCD as well. He is planning on overseas trip later this year and thinks he needs a Theme park manager for anxiety. He is interested in seeing psychiatrist for further evaluation.     No Known Allergies   Current Outpatient Prescriptions:  .  Cetirizine HCl (ZYRTEC ALLERGY) 10 MG CAPS, Take 1 tablet by mouth daily., Disp: , Rfl:  .  clonazePAM (KLONOPIN) 0.5 MG tablet, Take 0.5 mg by mouth 2 (two) times daily as needed for anxiety., Disp: , Rfl:  .  escitalopram (LEXAPRO) 20 MG tablet, Take 20 mg by mouth daily., Disp: , Rfl: 2 .  fluticasone (FLONASE) 50 MCG/ACT nasal spray, USE 1 SPRAY IN EACH NOSTRIL AS NEEDED, Disp: 16 g, Rfl: 2 .  hydrOXYzine (ATARAX/VISTARIL) 25 MG tablet, Take 25 mg by mouth 2 (two) times daily as needed for anxiety., Disp: , Rfl:  .  omeprazole (PRILOSEC OTC) 20 MG tablet, Take 20 mg by mouth daily., Disp: , Rfl:  .  triamcinolone cream (KENALOG) 0.1 %, APPLY TO RASH EXTERNALLY 2 TIMES A DAY, Disp: 60 g, Rfl: 1 .  valACYclovir (VALTREX) 1000 MG tablet, TAKE 2 TABLETS BY MOUTH TWICE A DAY AS NEEDED, Disp: 8 tablet, Rfl: 5  Review of Systems  Constitutional: Negative for appetite change, chills and fever.  Respiratory: Negative for chest tightness, shortness of breath and wheezing.   Cardiovascular: Negative for chest pain and palpitations.  Gastrointestinal: Negative for  abdominal pain, nausea and vomiting.  Psychiatric/Behavioral: The patient is nervous/anxious.     Social History  Substance Use Topics  . Smoking status: Never Smoker  . Smokeless tobacco: Never Used  . Alcohol use No   Objective:   BP 102/76 (BP Location: Left Arm, Patient Position: Sitting, Cuff Size: Normal)   Pulse 93   Temp 97.8 F (36.6 C) (Oral)   Resp 16   Wt 148 lb (67.1 kg)   SpO2 98% Comment: room air  BMI 20.07 kg/m  Vitals:   03/28/17 1113  BP: 102/76  Pulse: 93  Resp: 16  Temp: 97.8 F (36.6 C)  TempSrc: Oral  SpO2: 98%  Weight: 148 lb (67.1 kg)     Physical Exam  General appearance: alert, well developed, well nourished, cooperative and in no distress Head: Normocephalic, without obvious abnormality, atraumatic Respiratory: Respirations even and unlabored, normal respiratory rate Extremities: No gross deformities Skin: Skin color, texture, turgor normal. No rashes seen  Psych: Appropriate mood and affect. Neurologic: Mental status: Alert, oriented to person, place, and time, thought content appropriate.     Assessment & Plan:     1. Anxiety He reports little improvement since prescribed escitalopram last fall. Advised to continue until seen by psychiatry.  - Ambulatory referral to Psychiatry       Lelon Huh, MD  [facility]

## 2017-03-28 NOTE — Telephone Encounter (Signed)
Please review. Thanks!  

## 2017-04-10 ENCOUNTER — Encounter: Payer: Self-pay | Admitting: Psychiatry

## 2017-04-10 ENCOUNTER — Ambulatory Visit (INDEPENDENT_AMBULATORY_CARE_PROVIDER_SITE_OTHER): Payer: Commercial Managed Care - PPO | Admitting: Psychiatry

## 2017-04-10 VITALS — BP 116/78 | HR 73 | Ht 72.0 in | Wt 146.0 lb

## 2017-04-10 DIAGNOSIS — F3341 Major depressive disorder, recurrent, in partial remission: Secondary | ICD-10-CM

## 2017-04-10 DIAGNOSIS — F1210 Cannabis abuse, uncomplicated: Secondary | ICD-10-CM

## 2017-04-10 DIAGNOSIS — F411 Generalized anxiety disorder: Secondary | ICD-10-CM

## 2017-04-10 NOTE — Progress Notes (Signed)
Psychiatric Initial Adult Assessment   Patient Identification: Stuart Harper MRN:  [id number] Date of Evaluation:  04/10/2017 Referral Source: Dr.Fisher Chief Complaint:   Chief Complaint    Anxiety    depressed , anxious  Visit Diagnosis:  History of Present Illness:  Patient is a 21 year old Caucasian male seen today for an evaluation for depression and anxiety. He reports that while he was in his senior year of college in the fall of 2017 he developed severe anxiety and panic attacks. Stated that at that time he is unable to sleep had panic attacks on a daily basis and lost motivation and it and energy to do anything. He is started to see a psychiatrist at [facility] not started on Lexapro which improved some of his symptoms. He also was seeing a counselor there at the time. States that he was able to finish college with 2 majors. Currently he is living with his parents and working at [employer] 2 days a week. States that he is not sure of the Lexapro was helpful in the past but he stopped taking it when he started to becoming depressed again and lost all motivation. Again for the past month his been taking the Lexapro and is back to sleeping well and able to function. He also reports that he smokes weed on a daily basis and has been doing so for a year and a half. He has experimented with various substances and reports that he likes mushrooms and uses them twice a week. States that he drinks once in a while and he really doesn't enjoy that. The obsessive-compulsive symptoms that started about in fifth and sixth grade. States that he's had symptoms with handwashing, counting and checking over the years. Currently reports they do not impair his functioning but he does have them. Denies any trauma. Denies any type of abuse. He denies any suicidal thoughts or psychotic symptoms. He denies any manic symptoms.  PHQ 9 of 9  Associated Signs/Symptoms: Depression Symptoms:   anhedonia, fatigue, difficulty concentrating, anxiety, (Hypo) Manic Symptoms:  denies Anxiety Symptoms:  Excessive Worry, Obsessive Compulsive Symptoms:   Checking, Counting, Handwashing,, Psychotic Symptoms:  denies PTSD Symptoms: Negative  Past Psychiatric History: No hospitalizations. No suicide attempts.  Previous Psychotropic Medications: Yes Lexapro  Substance Abuse History in the last 12 months:  Yes.    Consequences of Substance Abuse: Negative  Past Medical History:  Past Medical History:  Diagnosis Date  . Allergy   . Asthma   . Seasonal allergies     Past Surgical History:  Procedure Laterality Date  . None      Family Psychiatric History: Drug abuse and bipolar disorder in paternal grandmother, uncles. Father has depression, is on medication.  Family History:  Family History  Problem Relation Age of Onset  . Hypertension Father   . Allergies Father   . Osteoarthritis Father   . Allergies Mother   . Asthma Mother   . Migraines Mother   . Allergies Sister   . Asthma Sister   . Asthma Paternal Uncle   . Lung cancer Maternal Grandfather   . Bipolar disorder Paternal Grandmother   . Alzheimer's disease Paternal Grandfather   . CAD Other     Social History:   Social History   Social History  . Marital status: Single    Spouse name: N/A  . Number of children: N/A  . Years of education: N/A   Social History Main Topics  . Smoking status: Never Smoker  .  Smokeless tobacco: Never Used  . Alcohol use No  . Drug use: Yes    Frequency: 14.0 times per week    Types: Marijuana  . Sexual activity: Not Currently   Other Topics Concern  . None   Social History Narrative   Lives with parents in 2 story home.     Student at [employer], sophomore. 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             Additional Social History: Lives with his family and working at [employer]. 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 Patient was born in [place] and raised in [place] for the first 10 years of his life. States that he had a happy childhood until then. States that once he returned to the [place] his anxiety level started the increasing and felt bullied. Overall there has been some level of anxiety and OCD throughout his teenage years as well.  Allergies:  No Known Allergies  Metabolic Disorder Labs: No results found for: HGBA1C, MPG No results found for: PROLACTIN No results found for: CHOL, TRIG, HDL, CHOLHDL, VLDL, LDLCALC   Current Medications: Current Outpatient Prescriptions  Medication Sig Dispense Refill  . Cetirizine HCl (ZYRTEC ALLERGY) 10 MG CAPS Take 1 tablet by mouth daily.    Marland Kitchen escitalopram (LEXAPRO) 20 MG tablet Take 20 mg by mouth daily.  2  . fluticasone (FLONASE) 50 MCG/ACT nasal spray USE 1 SPRAY IN EACH NOSTRIL AS NEEDED 16 g 2  . hydrOXYzine (ATARAX/VISTARIL) 25 MG tablet Take 25 mg by mouth 2 (two) times daily as needed for anxiety.    Marland Kitchen omeprazole (PRILOSEC OTC) 20 MG tablet Take 20 mg by mouth daily.    Marland Kitchen triamcinolone cream (KENALOG) 0.1 % APPLY TO RASH EXTERNALLY 2 TIMES A DAY 60 g 1  . valACYclovir (VALTREX) 1000 MG tablet TAKE 2 TABLETS BY MOUTH TWICE A DAY AS NEEDED 8 tablet 5   No current facility-administered medications for this visit.     Neurologic: Headache: No Seizure: No Paresthesias:No  Musculoskeletal: Strength & Muscle Tone: within normal limits Gait & Station: normal Patient leans: N/A  Psychiatric Specialty Exam: ROS  Blood pressure 116/78, pulse 73, height 6' (1.829 m), weight 146 lb (66.2 kg).Body mass index is 19.8 kg/m.  General Appearance: Casual  Eye Contact:  Fair  Speech:  Clear and Coherent  Volume:  Normal  Mood:  Anxious, Depressed and Dysphoric  Affect:  Constricted, Depressed and Flat  Thought Process:  Coherent  Orientation:  Full (Time,  Place, and Person)  Thought Content:  WDL  Suicidal Thoughts:  No  Homicidal Thoughts:  No  Memory:  Immediate;   Fair Recent;   Fair Remote;   Fair  Judgement:  Fair  Insight:  Fair  Psychomotor Activity:  Normal  Concentration:  Concentration: Fair and Attention Span: Fair  Recall:  [employer] of Knowledge:Fair  Language: Fair  Akathisia:  No  Handed:  Right  AIMS (if indicated):  na  Assets:  Communication Skills Desire for Improvement Housing Physical Health Resilience Social Support Vocational/Educational  ADL's:  Intact  Cognition: WNL  Sleep:  Sleeping well    Treatment Plan Summary: Generalized Anxiety Disorder Continue Lexapro 20 mg. Patient states that he does have his parents in the form of support and not seeing a therapist  currently.  Cannabis dependence We discussed how his daily cannabis use and also use of mushrooms could be contribution to the anxiety and interfering with some of the treatment. Patient was recommended to follow up with a substance abuse counselor in [place] and get an assessment.  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 Patient is to look into this and bring back the paperwork in 2-3 weeks' time.  Return to clinic in 2-3 weeks time or call before if needed.   Elvin So, MD 7/23/201812:19 PM

## 2017-04-14 ENCOUNTER — Ambulatory Visit (HOSPITAL_COMMUNITY): Payer: Commercial Managed Care - PPO | Admitting: Psychology

## 2017-04-14 DIAGNOSIS — F1220 Cannabis dependence, uncomplicated: Secondary | ICD-10-CM

## 2017-04-14 DIAGNOSIS — F1620 Hallucinogen dependence, uncomplicated: Secondary | ICD-10-CM

## 2017-04-18 ENCOUNTER — Ambulatory Visit: Payer: Commercial Managed Care - PPO | Admitting: Gastroenterology

## 2017-04-18 ENCOUNTER — Encounter: Payer: Self-pay | Admitting: Gastroenterology

## 2017-04-21 ENCOUNTER — Encounter (HOSPITAL_COMMUNITY): Payer: Self-pay | Admitting: Psychology

## 2017-04-21 DIAGNOSIS — F1220 Cannabis dependence, uncomplicated: Secondary | ICD-10-CM | POA: Insufficient documentation

## 2017-04-21 DIAGNOSIS — F1620 Hallucinogen dependence, uncomplicated: Secondary | ICD-10-CM

## 2017-04-21 HISTORY — DX: Hallucinogen dependence, uncomplicated: F16.20

## 2017-04-21 NOTE — Progress Notes (Signed)
The patient is a 21 yo single, white, male referred for a substance abuse assessment by Dr. [name] at [facility] in [place], [place]. The patient had met with [name] with the intention of gaining medical approval to have his new Einar Gip puppy certified as a Neurosurgeon. He has had her for three weeks, but has found she has a very soothing and comforting effect on him. The patient struggles with anxiety and has a history of OCD behaviors. The patient is also a heavy daily cannabis user (1/2-3 grams per day) and is tripping on mushrooms twice a week. He is adammant that these drugs are not having any negative effects or repercussions on his physical or mental health. It is my belief taht this young man is in denial. He is chemically dependent and using drugs that have, potentially, profound psychological effects on his brain. Both of the substances he is using have been directly implicated in psychotic episodes in others and are certainly capable of contributing to his anxiety and mental health issues that he has identified in the past. I would recommend that this fellow stop using all mind-altering chemicals for six months to a year in order to determine what his mental health condition really is. He is an addict and, of course, he does not want to stop using. But the fact remains that until the patient undergoes a significant period of total abstinence, we have no way of identifying and/or successfully addressing his psychiatric condition.

## 2017-04-25 ENCOUNTER — Encounter: Payer: Self-pay | Admitting: Gastroenterology

## 2017-04-25 ENCOUNTER — Other Ambulatory Visit: Payer: Self-pay

## 2017-04-25 ENCOUNTER — Ambulatory Visit (INDEPENDENT_AMBULATORY_CARE_PROVIDER_SITE_OTHER): Payer: Commercial Managed Care - PPO | Admitting: Gastroenterology

## 2017-04-25 VITALS — BP 141/90 | HR 74 | Temp 98.3°F | Ht 72.0 in | Wt 151.4 lb

## 2017-04-25 DIAGNOSIS — K625 Hemorrhage of anus and rectum: Secondary | ICD-10-CM | POA: Diagnosis not present

## 2017-04-25 DIAGNOSIS — R110 Nausea: Secondary | ICD-10-CM

## 2017-04-25 MED ORDER — NA SULFATE-K SULFATE-MG SULF 17.5-3.13-1.6 GM/177ML PO SOLN: 1.0000 | Freq: Once | ORAL | 0 refills | Status: AC

## 2017-04-25 NOTE — Progress Notes (Signed)
Stuart Bellows MD, MRCP(U.K) [address]  [place]  [place], [place] [address]  Main: [phone]  Fax: [phone]   Gastroenterology Consultation  Referring Provider:     Birdie Sons, MD Primary Care Physician:  Birdie Sons, MD Primary Gastroenterologist:  Dr. Jonathon Harper  Reason for Consultation:     Nausea        HPI:   Stuart Harper is a 21 y.o. y/o male referred for consultation & management  by Dr. Birdie Sons, MD.     Nausea: ongoing since 9 months when he started having issues with anxiety and has started on Lexapro . It has helped the nausea too. Usually worse in the morning and better towards end of the day . Did vomit a few months back but since then has not. No better when he is in the shower. Smokes marijuana 2 times a day for the past 3 years.   Diarrhea: has had in the past - none over the last 1 month. In the past had "green poop", since he started in the morning taking omeprazole a month back feels better.   Denies any weight loss. Rather gained 5-10 lbs over the past year.  He has had blood in his stool on a few occasions associated with passage of harder stool. No family history of colon cancer or polyps .   Denies use of NSAID's.   He is moving to [place] in a month.   Past Medical History:  Diagnosis Date  . Allergy   . Asthma   . Seasonal allergies     Past Surgical History:  Procedure Laterality Date  . None      Prior to Admission medications   Medication Sig Start Date End Date Taking? Authorizing Provider  Cetirizine HCl (ZYRTEC ALLERGY) 10 MG CAPS Take 1 tablet by mouth daily.    [provider]  escitalopram (LEXAPRO) 20 MG tablet Take 20 mg by mouth daily. 01/30/17   [provider]  fluticasone (FLONASE) 50 MCG/ACT nasal spray USE 1 SPRAY IN EACH NOSTRIL AS NEEDED 01/29/17   Birdie Sons, MD  hydrOXYzine (ATARAX/VISTARIL) 25 MG tablet Take 25 mg by mouth 2 (two) times daily as needed for  anxiety.    [provider]  omeprazole (PRILOSEC OTC) 20 MG tablet Take 20 mg by mouth daily.    [provider]  triamcinolone cream (KENALOG) 0.1 % APPLY TO RASH EXTERNALLY 2 TIMES A DAY 08/15/16   Birdie Sons, MD  valACYclovir (VALTREX) 1000 MG tablet TAKE 2 TABLETS BY MOUTH TWICE A DAY AS NEEDED 09/23/15   Birdie Sons, MD    Family History  Problem Relation Age of Onset  . Hypertension Father   . Allergies Father   . Osteoarthritis Father   . Allergies Mother   . Asthma Mother   . Migraines Mother   . Allergies Sister   . Asthma Sister   . Asthma Paternal Uncle   . Lung cancer Maternal Grandfather   . Bipolar disorder Paternal Grandmother   . Alzheimer's disease Paternal Grandfather   . CAD Other      Social History  Substance Use Topics  . Smoking status: Never Smoker  . Smokeless tobacco: Never Used  . Alcohol use No    Allergies as of 04/25/2017 - Review Complete 04/10/2017  Allergen Reaction Noted  . Shellfish allergy Rash 06/13/2016    Review of Systems:    All systems reviewed and  negative except where noted in HPI.   Physical Exam:  There were no vitals taken for this visit. No LMP for male patient. Psych:  Alert and cooperative. Normal mood and affect. General:   Alert,  Well-developed, well-nourished, pleasant and cooperative in NAD Head:  Normocephalic and atraumatic. Eyes:  Sclera clear, no icterus.   Conjunctiva pink. Ears:  Normal auditory acuity. Nose:  No deformity, discharge, or lesions. Mouth:  No deformity or lesions,oropharynx pink & moist. Neck:  Supple; no masses or thyromegaly. Lungs:  Respirations even and unlabored.  Clear throughout to auscultation.   No wheezes, crackles, or rhonchi. No acute distress. Heart:  Regular rate and rhythm; no murmurs, clicks, rubs, or gallops. Abdomen:  Normal bowel sounds.  No bruits.  Soft, non-tender and non-distended without masses, hepatosplenomegaly or hernias noted.  No  guarding or rebound tenderness.    Msk:  Symmetrical without gross deformities. Good, equal movement & strength bilaterally. Pulses:  Normal pulses noted. Extremities:  No clubbing or edema.  No cyanosis. Neurologic:  Alert and oriented x3;  grossly normal neurologically. Psych:  Alert and cooperative. Normal mood and affect.  Imaging Studies: No results found.  Assessment and Plan:   Stuart Harper is a 21 y.o. y/o male has been referred for nausea, He also has rectal bleeding . History suggests GERD likely secondary to cannabis use worse in the night . Rectal bleeding likely secondary to internal hemorrhoids.   Plan  1. Stop cannabis use 2. Zantac at night  3. EGD+colonoscopy   I have discussed alternative options, risks & benefits,  which include, but are not limited to, bleeding, infection, perforation,respiratory complication & drug reaction.  The patient agrees with this plan & written consent will be obtained.     Follow up as needed   Dr Stuart Bellows MD,MRCP(U.K)

## 2017-05-02 ENCOUNTER — Ambulatory Visit (INDEPENDENT_AMBULATORY_CARE_PROVIDER_SITE_OTHER): Payer: Commercial Managed Care - PPO | Admitting: Psychiatry

## 2017-05-02 ENCOUNTER — Encounter: Payer: Self-pay | Admitting: Psychiatry

## 2017-05-02 VITALS — BP 157/98 | HR 106 | Temp 97.8°F | Wt 154.0 lb

## 2017-05-02 DIAGNOSIS — F1220 Cannabis dependence, uncomplicated: Secondary | ICD-10-CM | POA: Diagnosis not present

## 2017-05-02 DIAGNOSIS — F1620 Hallucinogen dependence, uncomplicated: Secondary | ICD-10-CM | POA: Diagnosis not present

## 2017-05-02 DIAGNOSIS — F411 Generalized anxiety disorder: Secondary | ICD-10-CM

## 2017-05-02 MED ORDER — ESCITALOPRAM OXALATE 20 MG PO TABS: 20.0000 mg | ORAL_TABLET | Freq: Every day | ORAL | 1 refills | Status: DC

## 2017-05-02 NOTE — Progress Notes (Signed)
Psychiatric progress note  Patient Identification: Stuart Harper MRN:  [id number] Date of Evaluation:  05/02/2017 Referral Source: Dr.Fisher Chief Complaint:   depressed , anxious  Visit Diagnosis: Cannabis use disorder severe with dependence, hallucinogenic mushrooms use disorder severe, denies anxiety disorder  History of Present Illness:  Patient is a 21 year old Caucasian male seen today for a f/u for depression and anxiety. Patient reports that he is doing well in terms of his mood. Compliant with the Lexapro. Fair sleep and appetite. Some anxiety about being in [place]. However he is excited to be moving forward. Patient very focused on obtaining a letter to take his dog with him. We discussed his assessment with Brandon Melnick , substance abuse counsellor at [facility] and the dependence on the cannabis and mushrooms. However patient is not interested in obtaining any type of help for this. States that he does not see himself as an adult who continued to smoke but right now he is not interested in doing anything about this. Patient will be in [place] for 7 months at this time.  Past Psychiatric History: No hospitalizations. No suicide attempts.  Previous Psychotropic Medications: Yes Lexapro  Substance Abuse History in the last 12 months:  Yes.    Consequences of Substance Abuse: Negative  Past Medical History:  Past Medical History:  Diagnosis Date  . Allergy   . Asthma   . Seasonal allergies     Past Surgical History:  Procedure Laterality Date  . None      Family Psychiatric History: Drug abuse and bipolar disorder in paternal grandmother, uncles. Father has depression, is on medication.  Family History:  Family History  Problem Relation Age of Onset  . Hypertension Father   . Allergies Father   . Osteoarthritis Father   . Depression Father   . Allergies Mother   . Asthma Mother   . Migraines Mother   . Asthma Paternal Uncle   . Lung cancer Maternal Grandfather    . Bipolar disorder Paternal Grandmother   . Alzheimer's disease Paternal Grandfather   . CAD Other     Social History:   Social History   Social History  . Marital status: Single    Spouse name: N/A  . Number of children: N/A  . Years of education: N/A   Social History Main Topics  . Smoking status: Never Smoker  . Smokeless tobacco: Never Used  . Alcohol use No  . Drug use: Yes    Frequency: 14.0 times per week    Types: Marijuana     Comment: last used 05-01-17  . Sexual activity: Not Currently   Other Topics Concern  . None   Social History Narrative   Lives with parents in 2 story home.     Student at [employer], sophomore. 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             Additional Social History: Lives with his family and working at [employer]. 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 Patient was born in [place] and raised in [place] for the first 10 years of his life. States that he had a happy childhood until then. States that once he returned to the [place] his anxiety level started the increasing and felt bullied. Overall there has been some level of anxiety and OCD throughout his teenage years  as well.  Allergies:   Allergies  Allergen Reactions  . Shellfish Allergy Rash    Metabolic Disorder Labs: No results found for: HGBA1C, MPG No results found for: PROLACTIN No results found for: CHOL, TRIG, HDL, CHOLHDL, VLDL, LDLCALC   Current Medications: Current Outpatient Prescriptions  Medication Sig Dispense Refill  . Cetirizine HCl (ZYRTEC ALLERGY) 10 MG CAPS Take 1 tablet by mouth daily.    Marland Kitchen escitalopram (LEXAPRO) 20 MG tablet Take 1 tablet (20 mg total) by mouth daily. 90 tablet 1  . fluticasone (FLONASE) 50 MCG/ACT nasal spray USE 1 SPRAY IN EACH NOSTRIL AS NEEDED 16 g 2  . hydrOXYzine (ATARAX/VISTARIL) 25 MG tablet Take 25  mg by mouth 2 (two) times daily as needed for anxiety.    Marland Kitchen omeprazole (PRILOSEC OTC) 20 MG tablet Take 20 mg by mouth daily.    Marland Kitchen triamcinolone cream (KENALOG) 0.1 % APPLY TO RASH EXTERNALLY 2 TIMES A DAY 60 g 1  . valACYclovir (VALTREX) 1000 MG tablet TAKE 2 TABLETS BY MOUTH TWICE A DAY AS NEEDED 8 tablet 5   No current facility-administered medications for this visit.     Neurologic: Headache: No Seizure: No Paresthesias:No  Musculoskeletal: Strength & Muscle Tone: within normal limits Gait & Station: normal Patient leans: N/A  Psychiatric Specialty Exam: ROS  Blood pressure (!) 157/98, pulse (!) 106, temperature 97.8 F (36.6 C), temperature source Oral, weight 154 lb (69.9 kg).Body mass index is 20.89 kg/m.  General Appearance: Casual  Eye Contact:  Fair  Speech:  Clear and Coherent  Volume:  Normal  Mood:  Improved   Affect:  Pleasant   Thought Process:  Coherent  Orientation:  Full (Time, Place, and Person)  Thought Content:  WDL  Suicidal Thoughts:  No  Homicidal Thoughts:  No  Memory:  Immediate;   Fair Recent;   Fair Remote;   Fair  Judgement:  Fair  Insight:  Fair  Psychomotor Activity:  Normal  Concentration:  Concentration: Fair and Attention Span: Fair  Recall:  [employer] of Knowledge:Fair  Language: Fair  Akathisia:  No  Handed:  Right  AIMS (if indicated):  na  Assets:  Communication Skills Desire for Improvement Housing Physical Health Resilience Social Support Vocational/Educational  ADL's:  Intact  Cognition: WNL  Sleep:  Sleeping well    Treatment Plan Summary: Generalized Anxiety Disorder Continue Lexapro 20 mg. Patient states that he does have his parents in the form of support and not seeing a therapist currently.  Cannabis dependence We discussed how his daily cannabis use and also use of mushrooms could be contribution to the anxiety and interfering with some of the treatment.  Patient not interested in obtaining any  treatment for his substance dependence. 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 Patient is to look into this and bring back the paperwork in 2-3 weeks' time.  Return to clinic when he is back in the [place].   Elvin So, MD 8/14/201811:23 AM

## 2017-05-25 ENCOUNTER — Encounter: Payer: Self-pay | Admitting: *Deleted

## 2017-05-26 ENCOUNTER — Ambulatory Visit: Payer: Commercial Managed Care - PPO | Admitting: Anesthesiology

## 2017-05-26 ENCOUNTER — Encounter
Admission: RE | Disposition: A | Discharge status: Home / Self Care | Payer: Self-pay | Source: Ambulatory Visit | Attending: Gastroenterology

## 2017-05-26 ENCOUNTER — Ambulatory Visit
Admission: RE | Admit: 2017-05-26 | Discharge: 2017-05-26 | Disposition: A | Discharge status: Home / Self Care | Payer: Commercial Managed Care - PPO | Source: Ambulatory Visit | Attending: Gastroenterology | Admitting: Gastroenterology

## 2017-05-26 DIAGNOSIS — K219 Gastro-esophageal reflux disease without esophagitis: Secondary | ICD-10-CM | POA: Diagnosis not present

## 2017-05-26 DIAGNOSIS — K2950 Unspecified chronic gastritis without bleeding: Secondary | ICD-10-CM | POA: Diagnosis not present

## 2017-05-26 DIAGNOSIS — K2970 Gastritis, unspecified, without bleeding: Secondary | ICD-10-CM

## 2017-05-26 DIAGNOSIS — Z79899 Other long term (current) drug therapy: Secondary | ICD-10-CM | POA: Diagnosis not present

## 2017-05-26 DIAGNOSIS — J45909 Unspecified asthma, uncomplicated: Secondary | ICD-10-CM | POA: Insufficient documentation

## 2017-05-26 DIAGNOSIS — K648 Other hemorrhoids: Secondary | ICD-10-CM | POA: Insufficient documentation

## 2017-05-26 DIAGNOSIS — K625 Hemorrhage of anus and rectum: Secondary | ICD-10-CM | POA: Diagnosis not present

## 2017-05-26 DIAGNOSIS — F419 Anxiety disorder, unspecified: Secondary | ICD-10-CM | POA: Insufficient documentation

## 2017-05-26 DIAGNOSIS — Z91013 Allergy to seafood: Secondary | ICD-10-CM | POA: Diagnosis not present

## 2017-05-26 DIAGNOSIS — K640 First degree hemorrhoids: Secondary | ICD-10-CM

## 2017-05-26 DIAGNOSIS — R110 Nausea: Secondary | ICD-10-CM | POA: Diagnosis not present

## 2017-05-26 HISTORY — DX: Anxiety disorder, unspecified: F41.9

## 2017-05-26 HISTORY — PX: COLONOSCOPY WITH PROPOFOL: SHX5780

## 2017-05-26 HISTORY — PX: ESOPHAGOGASTRODUODENOSCOPY (EGD) WITH PROPOFOL: SHX5813

## 2017-05-26 SURGERY — ESOPHAGOGASTRODUODENOSCOPY (EGD) WITH PROPOFOL
Anesthesia: General

## 2017-05-26 MED ORDER — GLYCOPYRROLATE 0.2 MG/ML IJ SOLN
INTRAMUSCULAR | Status: AC
  Filled 2017-05-26: qty 1

## 2017-05-26 MED ORDER — MIDAZOLAM HCL 2 MG/2ML IJ SOLN
INTRAMUSCULAR | Status: DC | PRN
  Administered 2017-05-26 (×2): 1 mg via INTRAVENOUS

## 2017-05-26 MED ORDER — FENTANYL CITRATE (PF) 100 MCG/2ML IJ SOLN
INTRAMUSCULAR | Status: AC
  Filled 2017-05-26: qty 2

## 2017-05-26 MED ORDER — FENTANYL CITRATE (PF) 100 MCG/2ML IJ SOLN
INTRAMUSCULAR | Status: DC | PRN
  Administered 2017-05-26 (×2): 50 ug via INTRAVENOUS

## 2017-05-26 MED ORDER — PROPOFOL 500 MG/50ML IV EMUL
INTRAVENOUS | Status: AC
  Filled 2017-05-26: qty 50

## 2017-05-26 MED ORDER — PROPOFOL 10 MG/ML IV BOLUS
INTRAVENOUS | Status: DC | PRN
  Administered 2017-05-26: 20 mg via INTRAVENOUS
  Administered 2017-05-26: 80 mg via INTRAVENOUS
  Administered 2017-05-26: 20 mg via INTRAVENOUS

## 2017-05-26 MED ORDER — PROPOFOL 500 MG/50ML IV EMUL
INTRAVENOUS | Status: DC | PRN
  Administered 2017-05-26: 150 ug/kg/min via INTRAVENOUS

## 2017-05-26 MED ORDER — SODIUM CHLORIDE 0.9 % IV SOLN
INTRAVENOUS | Status: DC
  Administered 2017-05-26: 11:00:00 via INTRAVENOUS

## 2017-05-26 MED ORDER — MIDAZOLAM HCL 2 MG/2ML IJ SOLN
INTRAMUSCULAR | Status: AC
  Filled 2017-05-26: qty 2

## 2017-05-26 NOTE — Op Note (Signed)
[facility] Gastroenterology Patient Name: Stuart Harper Procedure Date: 05/26/2017 10:37 AM MRN: [id number] Account #: [id number] Date of Birth: 02-05-1996 Admit Type: Outpatient Age: 21 Room: [facility] ENDO ROOM 3 Gender: Male Note Status: Finalized Procedure:            Colonoscopy Indications:          Rectal bleeding Providers:            [name] MD, MD Referring MD:         Kirstie Peri. Caryn Section, MD (Referring MD) Medicines:            Monitored Anesthesia Care Complications:        No immediate complications. Procedure:            Pre-Anesthesia Assessment:                       - ASA Grade Assessment: II - A patient with mild                        systemic disease.                       After obtaining informed consent, the colonoscope was                        passed under direct vision. Throughout the procedure,                        the patient's blood pressure, pulse, and oxygen                        saturations were monitored continuously. The                        Colonoscope was introduced through the anus and                        advanced to the the cecum, identified by the                        appendiceal orifice, IC valve and transillumination.                        The colonoscopy was performed with ease. The patient                        tolerated the procedure well. The quality of the bowel                        preparation was good. Findings:      The perianal and digital rectal examinations were normal.      Non-bleeding internal hemorrhoids were found during retroflexion. The       hemorrhoids were medium-sized.      The exam was otherwise without abnormality on direct and retroflexion       views. Impression:           - Non-bleeding internal hemorrhoids.                       - The examination was otherwise normal on direct and  retroflexion views.                       - No specimens  collected. Recommendation:       - Discharge patient to home (with escort).                       - Return to my office PRN. Procedure Code(s):    --- Professional ---                       [phone], Colonoscopy, flexible; diagnostic, including                        collection of specimen(s) by brushing or washing, when                        performed (separate procedure) Diagnosis Code(s):    --- Professional ---                       K62.5, Hemorrhage of anus and rectum                       K64.8, Other hemorrhoids CPT copyright 2016 American Medical Association. All rights reserved. The codes documented in this report are preliminary and upon coder review may  be revised to meet current compliance requirements. [name], MD [name] MD, MD 05/26/2017 11:03:22 AM This report has been signed electronically. Number of Addenda: 0 Note Initiated On: 05/26/2017 10:37 AM Scope Withdrawal Time: 0 hours 6 minutes 36 seconds  Total Procedure Duration: 0 hours 8 minutes 16 seconds       [facility]

## 2017-05-26 NOTE — H&P (Signed)
Stuart Bellows MD [address] [place], [place] [address] Phone: [phone] Fax : [phone]  Primary Care Physician:  Birdie Sons, MD Primary Gastroenterologist:  Dr. Jonathon Harper   Pre-Procedure History & Physical: HPI:  Stuart Harper is a 21 y.o. male is here for an endoscopy and colonoscopy.   Past Medical History:  Diagnosis Date  . Allergy   . Anxiety   . Asthma   . Seasonal allergies     Past Surgical History:  Procedure Laterality Date  . None      Prior to Admission medications   Medication Sig Start Date End Date Taking? Authorizing Provider  Cetirizine HCl (ZYRTEC ALLERGY) 10 MG CAPS Take 1 tablet by mouth daily.   Yes [provider]  escitalopram (LEXAPRO) 20 MG tablet Take 1 tablet (20 mg total) by mouth daily. 05/02/17  Yes Ravi, Himabindu, MD  fluticasone (FLONASE) 50 MCG/ACT nasal spray USE 1 SPRAY IN EACH NOSTRIL AS NEEDED 01/29/17  Yes Birdie Sons, MD  hydrOXYzine (ATARAX/VISTARIL) 25 MG tablet Take 25 mg by mouth 2 (two) times daily as needed for anxiety.   Yes [provider]  omeprazole (PRILOSEC OTC) 20 MG tablet Take 20 mg by mouth daily.   Yes [provider]  triamcinolone cream (KENALOG) 0.1 % APPLY TO RASH EXTERNALLY 2 TIMES A DAY 08/15/16   Birdie Sons, MD  valACYclovir (VALTREX) 1000 MG tablet TAKE 2 TABLETS BY MOUTH TWICE A DAY AS NEEDED 09/23/15   Birdie Sons, MD    Allergies as of 04/25/2017 - Review Complete 04/25/2017  Allergen Reaction Noted  . Shellfish allergy Rash 06/13/2016    Family History  Problem Relation Age of Onset  . Hypertension Father   . Allergies Father   . Osteoarthritis Father   . Depression Father   . Allergies Mother   . Asthma Mother   . Migraines Mother   . Asthma Paternal Uncle   . Lung cancer Maternal Grandfather   . Bipolar disorder Paternal Grandmother   . Alzheimer's disease Paternal Grandfather   . CAD Other     Social History   Social  History  . Marital status: Single    Spouse name: N/A  . Number of children: N/A  . Years of education: N/A   Occupational History  . Not on file.   Social History Main Topics  . Smoking status: Never Smoker  . Smokeless tobacco: Never Used  . Alcohol use No  . Drug use: Yes    Frequency: 14.0 times per week    Types: Marijuana     Comment: last used 05-01-17  . Sexual activity: Not Currently   Other Topics Concern  . Not on file   Social History Narrative   Lives with parents in 2 story home.     Student at [employer], sophomore. 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             Review of Systems: See HPI, otherwise negative ROS  Physical Exam: BP 127/88   Pulse 87   Temp 99.1 F (37.3 C) (Tympanic)   Resp 16   Ht 6' (1.829 m)   Wt 150 lb (68 kg)   SpO2 99%   BMI 20.34 kg/m  General:   Alert,  pleasant and cooperative in NAD Head:  Normocephalic and atraumatic. Neck:  Supple; no masses or thyromegaly. Lungs:  Clear throughout to  auscultation.    Heart:  Regular rate and rhythm. Abdomen:  Soft, nontender and nondistended. Normal bowel sounds, without guarding, and without rebound.   Neurologic:  Alert and  oriented x4;  grossly normal neurologically.  Impression/Plan: Stuart Harper is here for an endoscopy and colonoscopy to be performed for nausea and rectal bleeding   Risks, benefits, limitations, and alternatives regarding  endoscopy and colonoscopy have been reviewed with the patient.  Questions have been answered.  All parties agreeable.   Stuart Bellows, MD  05/26/2017, 10:29 AM

## 2017-05-26 NOTE — Anesthesia Post-op Follow-up Note (Signed)
Anesthesia QCDR form completed.        

## 2017-05-26 NOTE — Op Note (Signed)
[facility] Gastroenterology Patient Name: Stuart Harper Procedure Date: 05/26/2017 10:38 AM MRN: [id number] Account #: [id number] Date of Birth: 07/26/1996 Admit Type: Outpatient Age: 21 Room: [facility] ENDO ROOM 3 Gender: Male Note Status: Finalized Procedure:            Upper GI endoscopy Indications:          Nausea Providers:            [name] MD, MD Referring MD:         Kirstie Peri. Caryn Section, MD (Referring MD) Medicines:            Monitored Anesthesia Care Complications:        No immediate complications. Procedure:            Pre-Anesthesia Assessment:                       - Prior to the procedure, a History and Physical was                        performed, and patient medications, allergies and                        sensitivities were reviewed. The patient's tolerance of                        previous anesthesia was reviewed.                       - The risks and benefits of the procedure and the                        sedation options and risks were discussed with the                        patient. All questions were answered and informed                        consent was obtained.                       - ASA Grade Assessment: II - A patient with mild                        systemic disease.                       After obtaining informed consent, the endoscope was                        passed under direct vision. Throughout the procedure,                        the patient's blood pressure, pulse, and oxygen                        saturations were monitored continuously. The                        Colonoscope was introduced through the mouth, and  advanced to the third part of duodenum. The upper GI                        endoscopy was accomplished with ease. The patient                        tolerated the procedure well. Findings:      The examined duodenum was normal.      The esophagus was normal.      Diffuse mild  inflammation characterized by congestion (edema) and       erythema was found in the gastric antrum and in the prepyloric region of       the stomach. Biopsies were taken with a cold forceps for histology.      The exam was otherwise without abnormality. Impression:           - Normal examined duodenum.                       - Normal esophagus.                       - Gastritis. Biopsied.                       - The examination was otherwise normal. Recommendation:       - Await pathology results.                       - stop cannabis use Procedure Code(s):    --- Professional ---                       [phone], Esophagogastroduodenoscopy, flexible, transoral;                        with biopsy, single or multiple Diagnosis Code(s):    --- Professional ---                       K29.70, Gastritis, unspecified, without bleeding                       R11.0, Nausea CPT copyright 2016 American Medical Association. All rights reserved. The codes documented in this report are preliminary and upon coder review may  be revised to meet current compliance requirements. [name], MD [name] MD, MD 05/26/2017 10:50:56 AM This report has been signed electronically. Number of Addenda: 0 Note Initiated On: 05/26/2017 10:38 AM      [facility]

## 2017-05-26 NOTE — Transfer of Care (Signed)
Immediate Anesthesia Transfer of Care Note  Patient: Stuart Harper  Procedure(s) Performed: Procedure(s): ESOPHAGOGASTRODUODENOSCOPY (EGD) WITH PROPOFOL (N/A) COLONOSCOPY WITH PROPOFOL (N/A)  Patient Location: PACU  Anesthesia Type:General  Level of Consciousness: awake, alert  and oriented  Airway & Oxygen Therapy: Patient Spontanous Breathing and Patient connected to nasal cannula oxygen  Post-op Assessment: Report given to RN and Post -op Vital signs reviewed and stable  Post vital signs: Reviewed and stable  Last Vitals:  Vitals:   05/26/17 1018 05/26/17 1105  BP: 127/88 97/61  Pulse: 87 70  Resp: 16 16  Temp: 37.3 C (!) 36.3 C  SpO2: 99% 99%    Last Pain:  Vitals:   05/26/17 1105  TempSrc: Tympanic         Complications: No apparent anesthesia complications

## 2017-05-26 NOTE — Anesthesia Preprocedure Evaluation (Signed)
Anesthesia Evaluation  Patient identified by MRN, date of birth, ID band Patient awake    Reviewed: Allergy & Precautions, H&P , NPO status , Patient's Chart, lab work & pertinent test results  Airway Mallampati: I  TM Distance: >3 FB Neck ROM: full    Dental  (+) Chipped   Pulmonary neg shortness of breath, asthma ,           Cardiovascular Exercise Tolerance: Good (-) angina(-) Past MI and (-) DOE negative cardio ROS       Neuro/Psych  Neuromuscular disease negative psych ROS   GI/Hepatic Neg liver ROS, GERD  Medicated and Controlled,  Endo/Other  negative endocrine ROS  Renal/GU negative Renal ROS  negative genitourinary   Musculoskeletal   Abdominal   Peds  Hematology negative hematology ROS (+)   Anesthesia Other Findings Past Medical History: No date: Allergy No date: Anxiety No date: Asthma No date: Seasonal allergies  Past Surgical History: No date: None  BMI    Body Mass Index:  20.34 kg/m      Reproductive/Obstetrics negative OB ROS                             Anesthesia Physical Anesthesia Plan  ASA: III  Anesthesia Plan: General   Post-op Pain Management:    Induction: Intravenous  PONV Risk Score and Plan: Propofol infusion  Airway Management Planned: Natural Airway and Nasal Cannula  Additional Equipment:   Intra-op Plan:   Post-operative Plan:   Informed Consent: I have reviewed the patients History and Physical, chart, labs and discussed the procedure including the risks, benefits and alternatives for the proposed anesthesia with the patient or authorized representative who has indicated his/her understanding and acceptance.   Dental Advisory Given  Plan Discussed with: Anesthesiologist, CRNA and Surgeon  Anesthesia Plan Comments: (Patient reports that his friend will be taking him home and then remaining with him to monitor him as is our  policy.  Patient consented for risks of anesthesia including but not limited to:  - adverse reactions to medications - risk of intubation if required - damage to teeth, lips or other oral mucosa - sore throat or hoarseness - Damage to heart, brain, lungs or loss of life  Patient voiced understanding.)        Anesthesia Quick Evaluation

## 2017-05-26 NOTE — Anesthesia Postprocedure Evaluation (Signed)
Anesthesia Post Note  Patient: THEODEN MAUCH  Procedure(s) Performed: Procedure(s) (LRB): ESOPHAGOGASTRODUODENOSCOPY (EGD) WITH PROPOFOL (N/A) COLONOSCOPY WITH PROPOFOL (N/A)  Patient location during evaluation: Endoscopy Anesthesia Type: General Level of consciousness: awake and alert Pain management: pain level controlled Vital Signs Assessment: post-procedure vital signs reviewed and stable Respiratory status: spontaneous breathing, nonlabored ventilation, respiratory function stable and patient connected to nasal cannula oxygen Cardiovascular status: blood pressure returned to baseline and stable Postop Assessment: no signs of nausea or vomiting Anesthetic complications: no     Last Vitals:  Vitals:   05/26/17 1125 05/26/17 1135  BP: 95/75 115/77  Pulse: 69 66  Resp: 12 13  Temp:    SpO2: 98% 100%    Last Pain:  Vitals:   05/26/17 1105  TempSrc: Tympanic                 Precious Haws Piscitello

## 2017-05-29 ENCOUNTER — Encounter: Payer: Self-pay | Admitting: Gastroenterology

## 2017-05-29 ENCOUNTER — Ambulatory Visit: Payer: Self-pay | Admitting: Gastroenterology

## 2017-05-29 LAB — SURGICAL PATHOLOGY

## 2017-06-04 ENCOUNTER — Encounter: Payer: Self-pay | Admitting: Gastroenterology

## 2017-07-04 ENCOUNTER — Encounter: Payer: Self-pay | Admitting: Psychiatry

## 2017-07-04 ENCOUNTER — Ambulatory Visit (INDEPENDENT_AMBULATORY_CARE_PROVIDER_SITE_OTHER): Payer: Commercial Managed Care - PPO | Admitting: Psychiatry

## 2017-07-04 DIAGNOSIS — F411 Generalized anxiety disorder: Secondary | ICD-10-CM

## 2017-07-04 DIAGNOSIS — F1220 Cannabis dependence, uncomplicated: Secondary | ICD-10-CM

## 2017-07-04 MED ORDER — QUETIAPINE FUMARATE 50 MG PO TABS: 50.0000 mg | ORAL_TABLET | Freq: Two times a day (BID) | ORAL | 2 refills | Status: DC

## 2017-07-04 MED ORDER — ESCITALOPRAM OXALATE 10 MG PO TABS: 10.0000 mg | ORAL_TABLET | Freq: Every day | ORAL | 0 refills | Status: DC

## 2017-07-04 NOTE — Progress Notes (Signed)
Psychiatric progress note  Patient Identification: Stuart Harper MRN:  [id number] Date of Evaluation:  07/04/2017 Referral Source: Dr.Fisher Chief Complaint:   Chief Complaint    Follow-up; Medication Refill    depressed , anxious  Visit Diagnosis: Cannabis use disorder severe with dependence, hallucinogenic mushrooms use disorder severe, denies anxiety disorder  History of Present Illness:  Patient is a 21 year old Caucasian male seen today for a f/u for depression and anxiety. 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 Since then has been more anxious and nauseated. Reports having some passive suicidal thoughts when he went to [place], but overcame those thoughts. He went through extensive GI workup for the nausea, it was determined that anxiety is causing the nausea. Reports having frequent panic attacks. Not motivated to do anything. Continues to work at [employer] at 30 hours per week. States that he is paying off some loans to his parents. Continues to use in this on a daily basis 2-3 g. States that he has not been using any mushrooms. Not drinking. Denies any suicidal thoughts currently. However when he was in [place] last month he did have some thoughts that he felt he was better off dead. Though he is very skeptical of the role of the weed in his current anxiety and mood symptoms he is willing to give it a try at this time.    PHQ 9 of 17   Past Psychiatric History: No hospitalizations. No suicide attempts.  Previous Psychotropic Medications: Yes Lexapro  Substance Abuse History in the last 12 months:  Yes.    Consequences of Substance Abuse: Negative    Past Medical History:  Past Medical History:  Diagnosis Date  . Allergy   . Anxiety   . Asthma   . Seasonal allergies     Past Surgical History:  Procedure Laterality Date  . COLONOSCOPY WITH PROPOFOL N/A 05/26/2017   Procedure:  COLONOSCOPY WITH PROPOFOL;  Surgeon: [name], MD;  Location: [facility] ENDOSCOPY;  Service: Gastroenterology;  Laterality: N/A;  . ESOPHAGOGASTRODUODENOSCOPY (EGD) WITH PROPOFOL N/A 05/26/2017   Procedure: ESOPHAGOGASTRODUODENOSCOPY (EGD) WITH PROPOFOL;  Surgeon: [name], MD;  Location: [facility] ENDOSCOPY;  Service: Gastroenterology;  Laterality: N/A;  . None      Family Psychiatric History: Drug abuse and bipolar disorder in paternal grandmother, uncles. Father has depression, is on medication.  Family History:  Family History  Problem Relation Age of Onset  . Hypertension Father   . Allergies Father   . Osteoarthritis Father   . Depression Father   . Allergies Mother   . Asthma Mother   . Migraines Mother   . Asthma Paternal Uncle   . Lung cancer Maternal Grandfather   . Bipolar disorder Paternal Grandmother   . Alzheimer's disease Paternal Grandfather   . CAD Other     Social History:   Social History   Social History  . Marital status: Single    Spouse name: N/A  . Number of children: N/A  . Years of education: N/A   Social History Main Topics  . Smoking status: Never Smoker  . Smokeless tobacco: Never Used  . Alcohol use No  . Drug use: Yes    Frequency: 14.0 times per week    Types: Marijuana     Comment: last used 05-01-17  . Sexual activity: Not Currently   Other Topics Concern  . None   Social History  Narrative   Lives with parents in 2 story home.     Student at [employer], sophomore. 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             Additional Social History: Lives with his family and working at [employer]. 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 Patient was born in [place] and raised in [place] for the first 10 years of his life. States that he had a happy childhood until then. States that once he returned to the [place] his  anxiety level started the increasing and felt bullied. Overall there has been some level of anxiety and OCD throughout his teenage years as well.  Allergies:   Allergies  Allergen Reactions  . Shellfish Allergy Rash    Metabolic Disorder Labs: No results found for: HGBA1C, MPG No results found for: PROLACTIN No results found for: CHOL, TRIG, HDL, CHOLHDL, VLDL, LDLCALC   Current Medications: Current Outpatient Prescriptions  Medication Sig Dispense Refill  . Cetirizine HCl (ZYRTEC ALLERGY) 10 MG CAPS Take 1 tablet by mouth daily.    Marland Kitchen escitalopram (LEXAPRO) 20 MG tablet Take 1 tablet (20 mg total) by mouth daily. 90 tablet 1  . fluticasone (FLONASE) 50 MCG/ACT nasal spray USE 1 SPRAY IN EACH NOSTRIL AS NEEDED 16 g 2  . hydrOXYzine (ATARAX/VISTARIL) 25 MG tablet Take 25 mg by mouth 2 (two) times daily as needed for anxiety.    Marland Kitchen omeprazole (PRILOSEC OTC) 20 MG tablet Take 20 mg by mouth daily.    Marland Kitchen triamcinolone cream (KENALOG) 0.1 % APPLY TO RASH EXTERNALLY 2 TIMES A DAY 60 g 1  . valACYclovir (VALTREX) 1000 MG tablet TAKE 2 TABLETS BY MOUTH TWICE A DAY AS NEEDED 8 tablet 5   No current facility-administered medications for this visit.     Neurologic: Headache: No Seizure: No Paresthesias:No  Musculoskeletal: Strength & Muscle Tone: within normal limits Gait & Station: normal Patient leans: N/A  Psychiatric Specialty Exam: ROS  Blood pressure (!) 145/83, pulse 75, temperature 97.8 F (36.6 C), temperature source Oral, weight 149 lb (67.6 kg).Body mass index is 20.21 kg/m.  General Appearance: Casual  Eye Contact:  Fair  Speech:  Clear and Coherent  Volume:  Normal  Mood:  depressed  Affect:  Pleasant   Thought Process:  Coherent  Orientation:  Full (Time, Place, and Person)  Thought Content:  WDL  Suicidal Thoughts:  No  Homicidal Thoughts:  No  Memory:  Immediate;   Fair Recent;   Fair Remote;   Fair  Judgement:  Fair  Insight:  Fair  Psychomotor Activity:   Normal  Concentration:  Concentration: Fair and Attention Span: Fair  Recall:  [employer] of Knowledge:Fair  Language: Fair  Akathisia:  No  Handed:  Right  AIMS (if indicated):  na  Assets:  Communication Skills Desire for Improvement Housing Physical Health Resilience Social Support Vocational/Educational  ADL's:  Intact  Cognition: WNL  Sleep:  Erratic sleep pattern    Treatment Plan Summary: Generalized Anxiety Disorder Decrease Lexapro to 10 mg daily  Possible bipolar 2 disorder Start Seroquel at 50 mg twice daily. Discussed the side effects of sedation, increased appetite and long-term side effects with metabolic disturbances and movement disorders. Patient gave his verbal consent. He discussed how use of cannabis on a daily basis at 2-3  joints per day can be contributing to his mood instability. Patient is skeptical of this and is requesting evidence. This clinician then looked up some articles and read it out to him but he continues to be skeptical. Patient states that he does have his parents in the form of support and not seeing a therapist currently.  Cannabis dependence We discussed how his daily cannabis use his contribution to his lack of motivation, depression and anxiety. He is recommended to have an assessment at an evidence and start therapy for this cannabis dependence.  Return to clinic in 2-3 weeks' time   Elvin So, MD 10/16/201810:23 AM

## 2017-07-25 ENCOUNTER — Ambulatory Visit: Payer: Commercial Managed Care - PPO | Admitting: Psychiatry

## 2017-09-09 ENCOUNTER — Emergency Department
Admission: EM | Admit: 2017-09-09 | Discharge: 2017-09-09 | Disposition: A | Discharge status: Home / Self Care | Payer: Commercial Managed Care - PPO | Attending: Emergency Medicine | Admitting: Emergency Medicine

## 2017-09-09 ENCOUNTER — Other Ambulatory Visit: Payer: Self-pay

## 2017-09-09 ENCOUNTER — Encounter: Payer: Self-pay | Admitting: Emergency Medicine

## 2017-09-09 DIAGNOSIS — Z79899 Other long term (current) drug therapy: Secondary | ICD-10-CM | POA: Diagnosis not present

## 2017-09-09 DIAGNOSIS — R45851 Suicidal ideations: Secondary | ICD-10-CM | POA: Insufficient documentation

## 2017-09-09 DIAGNOSIS — J45909 Unspecified asthma, uncomplicated: Secondary | ICD-10-CM | POA: Diagnosis not present

## 2017-09-09 DIAGNOSIS — F319 Bipolar disorder, unspecified: Secondary | ICD-10-CM | POA: Insufficient documentation

## 2017-09-09 DIAGNOSIS — F419 Anxiety disorder, unspecified: Secondary | ICD-10-CM | POA: Diagnosis not present

## 2017-09-09 DIAGNOSIS — F410 Panic disorder [episodic paroxysmal anxiety] without agoraphobia: Secondary | ICD-10-CM | POA: Diagnosis present

## 2017-09-09 LAB — COMPREHENSIVE METABOLIC PANEL
ALBUMIN: 5.3 g/dL — AB (ref 3.5–5.0)
ALT: 13 U/L — AB (ref 17–63)
AST: 18 U/L (ref 15–41)
Alkaline Phosphatase: 46 U/L (ref 38–126)
Anion gap: 8 (ref 5–15)
BUN: 7 mg/dL (ref 6–20)
CHLORIDE: 105 mmol/L (ref 101–111)
CO2: 27 mmol/L (ref 22–32)
CREATININE: 0.76 mg/dL (ref 0.61–1.24)
Calcium: 9.6 mg/dL (ref 8.9–10.3)
GFR calc non Af Amer: >60 mL/min (ref >60)
GLUCOSE: 107 mg/dL — AB (ref 65–99)
Potassium: 3.6 mmol/L (ref 3.5–5.1)
SODIUM: 140 mmol/L (ref 135–145)
Total Bilirubin: 0.9 mg/dL (ref 0.3–1.2)
Total Protein: 8 g/dL (ref 6.5–8.1)

## 2017-09-09 LAB — CBC
HCT: 48.7 % (ref 40.0–52.0)
HEMOGLOBIN: 16.3 g/dL (ref 13.0–18.0)
MCH: 29.5 pg (ref 26.0–34.0)
MCHC: 33.4 g/dL (ref 32.0–36.0)
MCV: 88.4 fL (ref 80.0–100.0)
Platelets: 130 10*3/uL — ABNORMAL LOW (ref 150–440)
RBC: 5.51 MIL/uL (ref 4.40–5.90)
RDW: 13 % (ref 11.5–14.5)
WBC: 5.6 10*3/uL (ref 3.8–10.6)

## 2017-09-09 LAB — URINE DRUG SCREEN, QUALITATIVE (ARMC ONLY)
Amphetamines, Ur Screen: NOT DETECTED
Barbiturates, Ur Screen: NOT DETECTED
Benzodiazepine, Ur Scrn: NOT DETECTED
COCAINE METABOLITE, UR ~~LOC~~: NOT DETECTED
Cannabinoid 50 Ng, Ur ~~LOC~~: POSITIVE — AB
MDMA (ECSTASY) UR SCREEN: NOT DETECTED
METHADONE SCREEN, URINE: NOT DETECTED
Opiate, Ur Screen: NOT DETECTED
Phencyclidine (PCP) Ur S: NOT DETECTED
TRICYCLIC, UR SCREEN: NOT DETECTED

## 2017-09-09 LAB — ACETAMINOPHEN LEVEL

## 2017-09-09 LAB — ETHANOL: Alcohol, Ethyl (B): <10 mg/dL (ref <10)

## 2017-09-09 LAB — SALICYLATE LEVEL

## 2017-09-09 MED ORDER — OLANZAPINE 2.5 MG PO TABS: 2.5000 mg | ORAL_TABLET | Freq: Every day | ORAL | 0 refills | Status: DC

## 2017-09-09 MED ORDER — LORAZEPAM 1 MG PO TABS: 1.0000 mg | ORAL_TABLET | Freq: Three times a day (TID) | ORAL | 0 refills | Status: DC | PRN

## 2017-09-09 NOTE — BH Assessment (Signed)
Assessment Note  Stuart Harper is an 21 y.o. male who presented to [facility] ED 09/09/2017, voluntarily via POV due to anxiety attacks and constant symptoms which consist of mind racing, poor sleeping habits (no sleep last night), panicking, not being able to focus and poor appetite.   Patient denies hi and AVH.  Client has passive suicidal ideations and admits, "if I do not feel better soon I don't know what will happen".  Patient reports marijuana use, at least "2 joints daily", age of first use 21 y/o, last use 12 21/18. Client denies criminal hx with no current probation/parole or pending charges.  Client denies currently taking psychotropic medication.  Patient reports significant family support with his parents and being able to return to the home once discharged.  Patient reports current employment with [employer].  Diagnosis: Generalized Anxiety Disorder   Past Medical History:  Past Medical History:  Diagnosis Date  . Allergy   . Anxiety   . Asthma   . Seasonal allergies     Past Surgical History:  Procedure Laterality Date  . COLONOSCOPY WITH PROPOFOL N/A 05/26/2017   Procedure: COLONOSCOPY WITH PROPOFOL;  Surgeon: [name], MD;  Location: [facility] ENDOSCOPY;  Service: Gastroenterology;  Laterality: N/A;  . ESOPHAGOGASTRODUODENOSCOPY (EGD) WITH PROPOFOL N/A 05/26/2017   Procedure: ESOPHAGOGASTRODUODENOSCOPY (EGD) WITH PROPOFOL;  Surgeon: [name], MD;  Location: [facility] ENDOSCOPY;  Service: Gastroenterology;  Laterality: N/A;  . None      Family History:  Family History  Problem Relation Age of Onset  . Hypertension Father   . Allergies Father   . Osteoarthritis Father   . Depression Father   . Allergies Mother   . Asthma Mother   . Migraines Mother   . Asthma Paternal Uncle   . Lung cancer Maternal Grandfather   . Bipolar disorder Paternal Grandmother   . Alzheimer's disease Paternal Grandfather   . CAD Other     Social History:  reports that  has never smoked. he has  never used smokeless tobacco. He reports that he uses drugs. Drug: Marijuana. Frequency: 14.00 times per week. He reports that he does not drink alcohol.  Additional Social History:  Alcohol / Drug Use Pain Medications: See MAR Prescriptions: See MAR Over the Counter: See MAR History of alcohol / drug use?: Yes Longest period of sobriety (when/how long): before age of 21 years old Substance #1 Name of Substance 1: Marijuana 1 - Age of First Use: 21 y/o 1 - Amount (size/oz): "1 joint" 1 - Frequency: "2 times daily" 1 - Duration: unknown 1 - Last Use / Amount: 09/08/2017  CIWA: CIWA-Ar BP: 136/89 Pulse Rate: 85 COWS:    Allergies:  Allergies  Allergen Reactions  . Shellfish Allergy Rash    Home Medications:  (Not in a hospital admission)  OB/GYN Status:  No LMP for male patient.  General Assessment Data Location of Assessment: [facility] ED TTS Assessment: In system Is this a Tele or Face-to-Face Assessment?: Face-to-Face Is this an Initial Assessment or a Re-assessment for this encounter?: Initial Assessment Marital status: Single Maiden name: (N/A) Is patient pregnant?: No Pregnancy Status: No Living Arrangements: Parent Can pt return to current living arrangement?: Yes Admission Status: Involuntary Is patient capable of signing voluntary admission?: No Referral Source: Self/Family/Friend Insurance type: Facilities manager Exam (Bruni) Medical Exam completed: Yes  Crisis Care Plan Living Arrangements: Parent Legal Guardian: (N/A) Name of Psychiatrist: Dr. Biagio Quint  Name of Therapist: N/A  Education Status  Is patient currently in school?: No  Risk to self with the past 6 months Suicidal Ideation: No Has patient been a risk to self within the past 6 months prior to admission? : No Suicidal Intent: No Has patient had any suicidal intent within the past 6 months prior to admission? : No Is patient at risk for suicide?: No Suicidal  Plan?: No Has patient had any suicidal plan within the past 6 months prior to admission? : No Access to Means: No What has been your use of drugs/alcohol within the last 12 months?: Marijuana daily  Previous Attempts/Gestures: No How many times?: 0 Other Self Harm Risks: N/A Triggers for Past Attempts: None known Intentional Self Injurious Behavior: None Family Suicide History: No Recent stressful life event(s): Financial Problems Persecutory voices/beliefs?: No Depression: Yes Depression Symptoms: Insomnia Substance abuse history and/or treatment for substance abuse?: Yes Suicide prevention information given to non-admitted patients: Not applicable  Risk to Others within the past 6 months Homicidal Ideation: No Does patient have any lifetime risk of violence toward others beyond the six months prior to admission? : No Thoughts of Harm to Others: No Current Homicidal Intent: No Current Homicidal Plan: No Access to Homicidal Means: No Identified Victim: N/A History of harm to others?: No Assessment of Violence: None Noted Violent Behavior Description: N/A Does patient have access to weapons?: No Criminal Charges Pending?: No Does patient have a court date: No Is patient on probation?: No  Psychosis Hallucinations: None noted Delusions: None noted  Mental Status Report Appearance/Hygiene: In scrubs Eye Contact: Fair Motor Activity: Unremarkable Speech: Soft, Slow Level of Consciousness: Alert, Quiet/awake Mood: Despair, Helpless, Worthless, low self-esteem, Anxious Affect: Anxious Anxiety Level: Moderate Thought Processes: Flight of Ideas Judgement: Unimpaired Orientation: Appropriate for developmental age Obsessive Compulsive Thoughts/Behaviors: None  Cognitive Functioning Concentration: Normal Memory: Recent Intact Insight: Fair Impulse Control: Unable to Assess Appetite: Poor Weight Loss: 0 Weight Gain: 0 Sleep: Decreased Total Hours of Sleep:  2 Vegetative Symptoms: None  ADLScreening [facility] Assessment Services) Patient's cognitive ability adequate to safely complete daily activities?: Yes Patient able to express need for assistance with ADLs?: Yes Independently performs ADLs?: Yes (appropriate for developmental age)  Prior Inpatient Therapy Prior Inpatient Therapy: No Prior Therapy Dates: None(seen by Dr. Biagio Quint) Prior Therapy Facilty/Provider(s): N/A Reason for Treatment: N/A  Prior Outpatient Therapy Prior Outpatient Therapy: Yes Prior Therapy Dates: 10/18 Prior Therapy Facilty/Provider(s): [facility] Reason for Treatment: Anxiety Does patient have an ACCT team?: No Does patient have Intensive In-House Services?  : No Does patient have [facility] services? : No Does patient have P4CC services?: No  ADL Screening (condition at time of admission) Patient's cognitive ability adequate to safely complete daily activities?: Yes Is the patient deaf or have difficulty hearing?: No Does the patient have difficulty seeing, even when wearing glasses/contacts?: No Does the patient have difficulty concentrating, remembering, or making decisions?: No Patient able to express need for assistance with ADLs?: Yes Does the patient have difficulty dressing or bathing?: No Independently performs ADLs?: Yes (appropriate for developmental age) Does the patient have difficulty walking or climbing stairs?: No Weakness of Legs: None Weakness of Arms/Hands: None  Home Assistive Devices/Equipment Home Assistive Devices/Equipment: None  Therapy Consults (therapy consults require a physician order) PT Evaluation Needed: No OT Evalulation Needed: No SLP Evaluation Needed: No Abuse/Neglect Assessment (Assessment to be complete while patient is alone) Abuse/Neglect Assessment Can Be Completed: Yes Physical Abuse: Denies Verbal Abuse: Denies Sexual Abuse: Denies Exploitation of patient/patient's  resources:  Denies Self-Neglect: Denies Values / Beliefs Cultural Requests During Hospitalization: None Spiritual Requests During Hospitalization: None Consults Spiritual Care Consult Needed: No Social Work Consult Needed: No Regulatory affairs officer (For Healthcare) Does Patient Have a Medical Advance Directive?: No Would patient like information on creating a medical advance directive?: No - Patient declined    Additional Information 1:1 In Past 12 Months?: No CIRT Risk: No Elopement Risk: No Does patient have medical clearance?: Yes  Child/Adolescent Assessment Running Away Risk: Denies Bed-Wetting: Denies Destruction of Property: Denies Cruelty to Animals: Denies Stealing: Denies Rebellious/Defies Authority: Denies Satanic Involvement: Denies Science writer: Denies Problems at [employer]: Denies Gang Involvement: Denies  Disposition:  Disposition Initial Assessment Completed for this Encounter: Yes Disposition of Patient: Pending Review with psychiatrist  On Site Evaluation by:   Reviewed with Physician:    Ileana Ladd, MSW, LCSWA 09/09/2017 1:10 PM

## 2017-09-09 NOTE — ED Triage Notes (Signed)
Pt to ED via POV for "panic attack". Pt states that over the past year he has been experiencing panic attacks. Pt states that for the past few days he has felt like his anxiety has been getting worse, pt states that he is unable to sleep and that he feels exhausted. Pt states that he has medications for anxiety but that "they are old at this point and I have not been taking them.

## 2017-09-09 NOTE — ED Provider Notes (Signed)
[facility] Emergency Department Provider Note  ____________________________________________   First MD Initiated Contact with Patient 09/09/17 1022     (approximate)  I have reviewed the triage vital signs and the nursing notes.   HISTORY  Chief Complaint Panic Attack   HPI TREYDON HENRICKS is a 21 y.o. male with a history of panic disorder who is presenting to the emergency department with anxiety and decreased energy.  He says that over the past several days he has had decreased energy and racing thoughts at the same time.  He says that he told his mother also that he "does not want to be here anymore."  However, he does not have any specific plan to hurt or kill himself.   Past Medical History:  Diagnosis Date  . Allergy   . Anxiety   . Asthma   . Seasonal allergies     Patient Active Problem List   Diagnosis Date Noted  . Cannabis use disorder, severe, dependence ([place]) 04/21/2017  . Hallucinogenic mushrooms use disorder, severe ([place]) 04/21/2017  . Cannabis abuse, daily use 06/03/2016  . Situational anxiety 06/03/2016  . Asthma 05/26/2016  . Atopic eczema 05/26/2016  . Screening 04/29/2015  . 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  . Allergic rhinitis, seasonal 07/14/2014    Past Surgical History:  Procedure Laterality Date  . COLONOSCOPY WITH PROPOFOL N/A 05/26/2017   Procedure: COLONOSCOPY WITH PROPOFOL;  Surgeon: [name], MD;  Location: [facility] ENDOSCOPY;  Service: Gastroenterology;  Laterality: N/A;  . ESOPHAGOGASTRODUODENOSCOPY (EGD) WITH PROPOFOL N/A 05/26/2017   Procedure: ESOPHAGOGASTRODUODENOSCOPY (EGD) WITH PROPOFOL;  Surgeon: [name], MD;  Location: [facility] ENDOSCOPY;  Service: Gastroenterology;  Laterality: N/A;  . None      Prior to Admission medications   Medication Sig Start Date End Date Taking? Authorizing Provider  Cetirizine HCl (ZYRTEC ALLERGY) 10 MG CAPS Take 1 tablet by mouth daily.    [provider]  escitalopram (LEXAPRO) 10 MG tablet Take 1 tablet (10 mg total) by mouth daily. 07/04/17   Elvin So, MD  fluticasone (FLONASE) 50 MCG/ACT nasal spray USE 1 SPRAY IN EACH NOSTRIL AS NEEDED 01/29/17   Birdie Sons, MD  hydrOXYzine (ATARAX/VISTARIL) 25 MG tablet Take 25 mg by mouth 2 (two) times daily as needed for anxiety.    [provider]  omeprazole (PRILOSEC OTC) 20 MG tablet Take 20 mg by mouth daily.    [provider]  QUEtiapine (SEROQUEL) 50 MG tablet Take 1 tablet (50 mg total) by mouth 2 (two) times daily. 07/04/17 07/04/18  Elvin So, MD  triamcinolone cream (KENALOG) 0.1 % APPLY TO RASH EXTERNALLY 2 TIMES A DAY 08/15/16   Birdie Sons, MD  valACYclovir (VALTREX) 1000 MG tablet TAKE 2 TABLETS BY MOUTH TWICE A DAY AS NEEDED 09/23/15   Birdie Sons, MD    Allergies Shellfish allergy  Family History  Problem Relation Age of Onset  . Hypertension Father   . Allergies Father   . Osteoarthritis Father   . Depression Father   . Allergies Mother   . Asthma Mother   . Migraines Mother   . Asthma Paternal Uncle   . Lung cancer Maternal Grandfather   . Bipolar disorder Paternal Grandmother   . Alzheimer's disease Paternal Grandfather   . CAD Other     Social History Social History   Tobacco Use  . Smoking status: Never Smoker  . Smokeless tobacco: Never Used  Substance Use Topics  .  Alcohol use: No  . Drug use: Yes    Frequency: 14.0 times per week    Types: Marijuana    Comment: last used last week    Review of Systems  Constitutional: No fever/chills Eyes: No visual changes. ENT: No sore throat. Cardiovascular: Denies chest pain. Respiratory: Denies shortness of breath. Gastrointestinal: No abdominal pain.  No nausea, no vomiting.  No diarrhea.  No constipation. Genitourinary: Negative for dysuria. Musculoskeletal: Negative for back pain. Skin: Negative for rash. Neurological: Negative for headaches, focal  weakness or numbness.   ____________________________________________   PHYSICAL EXAM:  VITAL SIGNS: ED Triage Vitals [09/09/17 0952]  Enc Vitals Group     BP 136/89     Pulse Rate 85     Resp 16     Temp 98.3 F (36.8 C)     Temp Source Oral     SpO2 98 %     Weight      Height      Head Circumference      Peak Flow      Pain Score      Pain Loc      Pain Edu?      Excl. in [place]?     Constitutional: Alert and oriented. Well appearing and in no acute distress. Eyes: Conjunctivae are normal.  Head: Atraumatic. Nose: No congestion/rhinnorhea. Mouth/Throat: Mucous membranes are moist.  Neck: No stridor.   Cardiovascular: Normal rate, regular rhythm. Grossly normal heart sounds.   Respiratory: Normal respiratory effort.  No retractions. Lungs CTAB. Gastrointestinal: Soft and nontender. No distention.  Musculoskeletal: No lower extremity tenderness nor edema.  No joint effusions. Neurologic:  Normal speech and language. No gross focal neurologic deficits are appreciated. Skin:  Skin is warm, dry and intact. No rash noted. Psychiatric: Mood and affect are normal. Speech and behavior are normal.  ____________________________________________   LABS (all labs ordered are listed, but only abnormal results are displayed)  Labs Reviewed  COMPREHENSIVE METABOLIC PANEL - Abnormal; Notable for the following components:      Result Value   Glucose, Bld 107 (*)    Albumin 5.3 (*)    ALT 13 (*)    All other components within normal limits  ACETAMINOPHEN LEVEL - Abnormal; Notable for the following components:   Acetaminophen (Tylenol), Serum <10 (*)    All other components within normal limits  CBC - Abnormal; Notable for the following components:   Platelets 130 (*)    All other components within normal limits  URINE DRUG SCREEN, QUALITATIVE ([facility] ONLY) - Abnormal; Notable for the following components:   Cannabinoid 50 Ng, Ur [place] POSITIVE (*)    All other components within  normal limits  ETHANOL  SALICYLATE LEVEL   ____________________________________________  EKG   ____________________________________________  RADIOLOGY   ____________________________________________   PROCEDURES  Procedure(s) performed:   Procedures  Critical Care performed:   ____________________________________________   INITIAL IMPRESSION / ASSESSMENT AND PLAN / ED COURSE  Pertinent labs & imaging results that were available during my care of the patient were reviewed by me and considered in my medical decision making (see chart for details).  DDX: Anxiety, depression, suicidal ideation As part of my medical decision making, I reviewed the following data within the electronic MEDICAL RECORD NUMBER Notes from prior ED visits  ----------------------------------------- 11:34 AM on 09/09/2017 -----------------------------------------  Patient with mixed symptoms of both decreased energy and racing/anxious thoughts as well as vague suicidal thoughts.  Patient will be placed under involuntary commitment  and seen by psychiatry.  The patient is understanding of this plan and willing to comply.      ____________________________________________   FINAL CLINICAL IMPRESSION(S) / ED DIAGNOSES  Anxiety.  Suicidal thoughts.    NEW MEDICATIONS STARTED DURING THIS VISIT:  This SmartLink is deprecated. Use AVSMEDLIST instead to display the medication list for a patient.   Note:  This document was prepared using Dragon voice recognition software and may include unintentional dictation errors.     Orbie Pyo, MD 09/09/17 1134

## 2017-09-09 NOTE — ED Notes (Signed)
Pt dressed out into hospital attire by this RN and [place] ED tech. Pt Hoodie, t shirt, underwear, pants, socks, and shoes placed in patient belongings bag and labeled with patient identifier.   Pt cell phone and wallet given to patient mother, Farah Benish, to take home.

## 2017-09-09 NOTE — ED Provider Notes (Signed)
Patient obviously has been rescinded by tele-neurology.  Cleared for discharge.  Outpatient follow-up recommended.   Delman Kitten, MD 09/09/17 [phone]

## 2017-09-09 NOTE — Progress Notes (Signed)
Pt A & O X3. Ambulatory to [facility] with a steady gait, accompanied by ED RN and officer. Presents with flat affect, pt observed to be restless / fidgety on assessment; unable to be still on interactions. Denies HI, pain and AVH at this time. Endorsed passive SI x 1 year "I always say no and they let me go but now I feel it needs the seriousness it deserves". "I talked to my mom and she brought me in after breakfast this morning, I have been feeling more anxious, panicking, my mind racing everywhere, I can't even focus, I'm not sleeping well ( I got 2 hours of sleep last night) when I took some Seroquel which I stopped taking for a month now because it was making me drowsy / groggy". 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 Last smoked THC "this morning when I was trying to sleep". "I took Seroquel and Lexapro but it did not do nothing for me". Emotional support and availability offered to pt. Unit routines discussed with pt and understanding verbalized. Q 15 minutes safety checks maintained without self harm gestures.

## 2017-09-09 NOTE — Discharge Instructions (Signed)

## 2017-09-13 ENCOUNTER — Ambulatory Visit: Payer: Self-pay | Admitting: Family Medicine

## 2017-09-13 NOTE — Progress Notes (Deleted)
       Patient: Stuart Harper Male    DOB: 1995-12-26   21 y.o.   MRN: [id number] Visit Date: 09/13/2017  Today's Provider: Lelon Huh, MD   No chief complaint on file.  Subjective:    HPI   Follow up ER visit  Patient was seen in ER for Panic attack on 09/09/2017.             He was treated for;mixed symptoms of both decreased energy and racing/anxious thoughts as well as vague suicidal thoughts.               Treatment for this included,Patient was placed under involuntary commitment and seen by psychiatry. The patient was understanding of this plan and willing to comply. He reports {DESC; EXCELLENT/GOOD/FAIR:19992} compliance with treatment. He reports this condition is {improved/worse/unchanged:3041574}.  ------------------------------------------------------------------------------------    Allergies  Allergen Reactions  . Shellfish Allergy Rash     Current Outpatient Medications:  .  Cetirizine HCl (ZYRTEC ALLERGY) 10 MG CAPS, Take 1 tablet by mouth daily., Disp: , Rfl:  .  escitalopram (LEXAPRO) 10 MG tablet, Take 1 tablet (10 mg total) by mouth daily., Disp: 30 tablet, Rfl: 0 .  fluticasone (FLONASE) 50 MCG/ACT nasal spray, USE 1 SPRAY IN EACH NOSTRIL AS NEEDED, Disp: 16 g, Rfl: 2 .  hydrOXYzine (ATARAX/VISTARIL) 25 MG tablet, Take 25 mg by mouth 2 (two) times daily as needed for anxiety., Disp: , Rfl:  .  LORazepam (ATIVAN) 1 MG tablet, Take 1 tablet (1 mg total) by mouth every 8 (eight) hours as needed for anxiety., Disp: 6 tablet, Rfl: 0 .  OLANZapine (ZYPREXA) 2.5 MG tablet, Take 1 tablet (2.5 mg total) by mouth at bedtime., Disp: 30 tablet, Rfl: 0 .  omeprazole (PRILOSEC OTC) 20 MG tablet, Take 20 mg by mouth daily., Disp: , Rfl:  .  QUEtiapine (SEROQUEL) 50 MG tablet, Take 1 tablet (50 mg total) by mouth 2 (two) times daily., Disp: 60 tablet, Rfl: 2 .  triamcinolone cream (KENALOG) 0.1 %, APPLY TO RASH EXTERNALLY 2 TIMES A DAY, Disp: 60 g, Rfl: 1 .   valACYclovir (VALTREX) 1000 MG tablet, TAKE 2 TABLETS BY MOUTH TWICE A DAY AS NEEDED, Disp: 8 tablet, Rfl: 5  Review of Systems  Constitutional: Negative for appetite change, chills and fever.  Respiratory: Negative for chest tightness, shortness of breath and wheezing.   Cardiovascular: Negative for chest pain and palpitations.  Gastrointestinal: Negative for abdominal pain, nausea and vomiting.    Social History   Tobacco Use  . Smoking status: Never Smoker  . Smokeless tobacco: Never Used  Substance Use Topics  . Alcohol use: No   Objective:   There were no vitals taken for this visit. There were no vitals filed for this visit.   Physical Exam      Assessment & Plan:           Lelon Huh, MD  [facility]

## 2017-10-02 ENCOUNTER — Ambulatory Visit: Payer: Commercial Managed Care - PPO | Admitting: Psychiatry

## 2017-10-09 ENCOUNTER — Ambulatory Visit: Payer: Commercial Managed Care - PPO | Admitting: Licensed Clinical Social Worker

## 2017-10-13 ENCOUNTER — Encounter: Payer: Self-pay | Admitting: Family Medicine

## 2017-10-13 ENCOUNTER — Ambulatory Visit: Payer: Commercial Managed Care - PPO | Admitting: Family Medicine

## 2017-10-13 VITALS — BP 128/80 | HR 104 | Temp 98.2°F | Resp 16 | Wt 155.0 lb

## 2017-10-13 DIAGNOSIS — F329 Major depressive disorder, single episode, unspecified: Secondary | ICD-10-CM | POA: Diagnosis not present

## 2017-10-13 DIAGNOSIS — F419 Anxiety disorder, unspecified: Secondary | ICD-10-CM | POA: Diagnosis not present

## 2017-10-13 DIAGNOSIS — R5383 Other fatigue: Secondary | ICD-10-CM | POA: Diagnosis not present

## 2017-10-13 DIAGNOSIS — F32A Depression, unspecified: Secondary | ICD-10-CM

## 2017-10-13 NOTE — Progress Notes (Signed)
Patient: Stuart Harper Male    DOB: May 26, 1996   22 y.o.   MRN: [id number] Visit Date: 10/13/2017  Today's Provider: Lelon Huh, MD   Chief Complaint  Patient presents with  . Anxiety   Subjective:    HPI Pt is here today to discuss his anxiety. He reports that his anxiety is starting to affect his work which is has never done before. He is also anxious that he is charge of taking care of his parents house since hey have moved to [place] for work. He was seen in the Er last month and advised to follow up with psychiatry suspected bipolar disorder. He was given  Prescription for six lorazepam and 30 zyprexa in the ER. He has been followed by Dr. [name] in the past but is requested to be referred to a different psychiatrist. He denies current suicidal ideation. He has long history of marijuana use and admits to using almost daily. He also states he feels tired all the time and is concerned he may have thyroid or testosterone abnormality and would like to have these checked     Allergies  Allergen Reactions  . Shellfish Allergy Rash      Review of Systems  Constitutional: Positive for appetite change and fatigue.  HENT: Negative.   Eyes: Negative.   Respiratory: Negative.   Cardiovascular: Negative.   Gastrointestinal: Negative.   Endocrine: Negative.   Genitourinary: Negative.   Musculoskeletal: Negative.   Skin: Negative.   Allergic/Immunologic: Negative.   Neurological: Negative.   Hematological: Negative.   Psychiatric/Behavioral: Positive for suicidal ideas. The patient is nervous/anxious.     Social History   Tobacco Use  . Smoking status: Never Smoker  . Smokeless tobacco: Never Used  Substance Use Topics  . Alcohol use: No   Depression screen [facility] 2/9 10/13/2017  Decreased Interest 3  Down, Depressed, Hopeless 3  PHQ - 2 Score 6  Altered sleeping 3  Tired, decreased energy 3  Change in appetite 3  Trouble concentrating 3  Moving slowly or  fidgety/restless 1  Suicidal thoughts 1  PHQ-9 Score 20  Difficult doing work/chores Very difficult    Objective:   BP 128/80 (BP Location: Right Arm, Patient Position: Sitting, Cuff Size: Normal)   Pulse (!) 104   Temp 98.2 F (36.8 C) (Oral)   Resp 16   Wt 155 lb (70.3 kg)   SpO2 97%   BMI 21.02 kg/m  Vitals:   10/13/17 1644  BP: 128/80  Pulse: (!) 104  Resp: 16  Temp: 98.2 F (36.8 C)  TempSrc: Oral  SpO2: 97%  Weight: 155 lb (70.3 kg)     Physical Exam   General Appearance:    Alert, cooperative, anxious appearing male in no distress.   Eyes:    PERRL, conjunctiva/corneas clear, EOM's intact       Lungs:     Clear to auscultation bilaterally, respirations unlabored  Heart:    Regular rate and rhythm  Neurologic:   Awake, alert, oriented x 3. No apparent focal neurological           defect.           Assessment & Plan:     1. Depression, unspecified depression type Discussed getting back on SSRI but he states he doesn't think they were ever helpful in the past. Stressed importance of getting in with mental health professional. He would like to see a different psychiatrist if there is  one his insurance covers.  - Ambulatory referral to Psychiatry - Ambulatory referral to Psychology  2. Anxiety  - Ambulatory referral to Psychiatry - Ambulatory referral to Psychology  3. Other fatigue  - TSH - Testosterone,Free and Total  Over half of this 25 minute visit were spent in counseling and coordinating care of multiple medical problems.       Lelon Huh, MD  [facility]

## 2017-10-16 ENCOUNTER — Telehealth: Payer: Self-pay | Admitting: Family Medicine

## 2017-10-16 ENCOUNTER — Emergency Department
Admission: EM | Admit: 2017-10-16 | Discharge: 2017-10-16 | Disposition: A | Discharge status: Home / Self Care | Payer: Commercial Managed Care - PPO | Attending: Emergency Medicine | Admitting: Emergency Medicine

## 2017-10-16 ENCOUNTER — Inpatient Hospital Stay (HOSPITAL_COMMUNITY)
Admission: AD | Admit: 2017-10-16 | Payer: Commercial Managed Care - PPO | Source: Intra-hospital | Admitting: Psychiatry

## 2017-10-16 ENCOUNTER — Encounter: Payer: Self-pay | Admitting: Emergency Medicine

## 2017-10-16 DIAGNOSIS — G4700 Insomnia, unspecified: Secondary | ICD-10-CM | POA: Diagnosis not present

## 2017-10-16 DIAGNOSIS — F332 Major depressive disorder, recurrent severe without psychotic features: Secondary | ICD-10-CM | POA: Diagnosis not present

## 2017-10-16 DIAGNOSIS — Z79899 Other long term (current) drug therapy: Secondary | ICD-10-CM | POA: Insufficient documentation

## 2017-10-16 DIAGNOSIS — F339 Major depressive disorder, recurrent, unspecified: Secondary | ICD-10-CM | POA: Diagnosis not present

## 2017-10-16 DIAGNOSIS — F410 Panic disorder [episodic paroxysmal anxiety] without agoraphobia: Secondary | ICD-10-CM

## 2017-10-16 DIAGNOSIS — F419 Anxiety disorder, unspecified: Secondary | ICD-10-CM

## 2017-10-16 DIAGNOSIS — F1210 Cannabis abuse, uncomplicated: Secondary | ICD-10-CM | POA: Diagnosis present

## 2017-10-16 DIAGNOSIS — J45909 Unspecified asthma, uncomplicated: Secondary | ICD-10-CM | POA: Insufficient documentation

## 2017-10-16 HISTORY — DX: Major depressive disorder, recurrent severe without psychotic features: F33.2

## 2017-10-16 LAB — CBC
HCT: 48.3 % (ref 40.0–52.0)
Hemoglobin: 16.4 g/dL (ref 13.0–18.0)
MCH: 29.8 pg (ref 26.0–34.0)
MCHC: 33.8 g/dL (ref 32.0–36.0)
MCV: 88.1 fL (ref 80.0–100.0)
PLATELETS: 148 10*3/uL — AB (ref 150–440)
RBC: 5.48 MIL/uL (ref 4.40–5.90)
RDW: 13.1 % (ref 11.5–14.5)
WBC: 5.8 10*3/uL (ref 3.8–10.6)

## 2017-10-16 LAB — COMPREHENSIVE METABOLIC PANEL
ALBUMIN: 5.3 g/dL — AB (ref 3.5–5.0)
ALT: 18 U/L (ref 17–63)
AST: 21 U/L (ref 15–41)
Alkaline Phosphatase: 40 U/L (ref 38–126)
Anion gap: 11 (ref 5–15)
BILIRUBIN TOTAL: 1.2 mg/dL (ref 0.3–1.2)
BUN: 13 mg/dL (ref 6–20)
CALCIUM: 9.4 mg/dL (ref 8.9–10.3)
CHLORIDE: 101 mmol/L (ref 101–111)
CO2: 27 mmol/L (ref 22–32)
CREATININE: 0.77 mg/dL (ref 0.61–1.24)
GFR calc Af Amer: >60 mL/min (ref >60)
GFR calc non Af Amer: >60 mL/min (ref >60)
Glucose, Bld: 102 mg/dL — ABNORMAL HIGH (ref 65–99)
POTASSIUM: 3.9 mmol/L (ref 3.5–5.1)
SODIUM: 139 mmol/L (ref 135–145)
TOTAL PROTEIN: 8.3 g/dL — AB (ref 6.5–8.1)

## 2017-10-16 LAB — URINE DRUG SCREEN, QUALITATIVE (ARMC ONLY)
Amphetamines, Ur Screen: NOT DETECTED
BARBITURATES, UR SCREEN: NOT DETECTED
BENZODIAZEPINE, UR SCRN: NOT DETECTED
CANNABINOID 50 NG, UR ~~LOC~~: POSITIVE — AB
COCAINE METABOLITE, UR ~~LOC~~: NOT DETECTED
MDMA (Ecstasy)Ur Screen: NOT DETECTED
Methadone Scn, Ur: NOT DETECTED
Opiate, Ur Screen: NOT DETECTED
Phencyclidine (PCP) Ur S: NOT DETECTED
Tricyclic, Ur Screen: POSITIVE — AB

## 2017-10-16 LAB — ETHANOL

## 2017-10-16 LAB — SALICYLATE LEVEL: Salicylate Lvl: <7 mg/dL (ref 2.8–30.0)

## 2017-10-16 LAB — ACETAMINOPHEN LEVEL: Acetaminophen (Tylenol), Serum: <10 ug/mL — ABNORMAL LOW (ref 10–30)

## 2017-10-16 MED ORDER — OLANZAPINE 5 MG PO TABS
2.5000 mg | ORAL_TABLET | Freq: Once | ORAL | Status: AC
  Administered 2017-10-16: 2.5 mg via ORAL
  Filled 2017-10-16: qty 1

## 2017-10-16 MED ORDER — FLUOXETINE HCL 20 MG PO CAPS: 20.0000 mg | ORAL_CAPSULE | Freq: Every day | ORAL | 1 refills | Status: DC

## 2017-10-16 MED ORDER — TRAZODONE HCL 100 MG PO TABS: 100.0000 mg | ORAL_TABLET | Freq: Every evening | ORAL | 1 refills | Status: DC | PRN

## 2017-10-16 NOTE — ED Notes (Signed)
Family at bedside. 

## 2017-10-16 NOTE — BH Assessment (Signed)
Writer was informed by Dr. Weber Cooks that patient refused services and no longer desired to be admitted into [facility]. Writer provided patient with informational sheet containing program contact for [facility] and [facility] and encouraged patient to follow up with these outpatient resources. Patient was informed about the customer service contact information on the back of his private insurance card and the possibility of being connected more community behavioral health resources.

## 2017-10-16 NOTE — Telephone Encounter (Signed)
Please advise 

## 2017-10-16 NOTE — ED Notes (Signed)
Preparing to walk patient to [facility]. Dr. Weber Cooks arrives to update patient re planned admission to [facility]. Patient declines and chooses to be discharged. Will keep patient in main ED for discharge.

## 2017-10-16 NOTE — ED Provider Notes (Signed)
[facility] Emergency Department Provider Note   ____________________________________________   First MD Initiated Contact with Patient 10/16/17 [id number]     (approximate)  I have reviewed the triage vital signs and the nursing notes.   HISTORY  Chief Complaint Psychiatric Evaluation    HPI Stuart Harper is a 22 y.o. male who comes into the hospital today with some insomnia, anxiety and depression.  The patient states that he has not slept in about a week.  He reports that he was seen here 1 month ago with anxiety.  He reports that he was unable to work and losing sleep.  He reports that it is occurring again.  The patient states that he has not been listening to his doctors.  He was prescribed Zyprexa and Ativan but he ran out of the Zyprexa 1 week ago and the Ativan much longer.  The patient states that because he has not had his medicine he is been taking Adderall and Xanax which she received from someone else.  He saw his primary care physician for referrals to see psychiatry but he states that he could not wait so long.  He denies any suicidal or homicidal ideation and he is not hallucinating but he reports that not being able to sleep has been getting to him.  He also thinks that he is unable to continue self-medicating.  The patient tried taking some Benadryl and hydroxyzine at home to help him sleep.  He also tried taking Seroquel which was prescribed to him previously but he has not been taking it.   Past Medical History:  Diagnosis Date  . Allergy   . Anxiety   . Asthma   . Seasonal allergies     Patient Active Problem List   Diagnosis Date Noted  . Cannabis use disorder, severe, dependence ([place]) 04/21/2017  . 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 Cannabis abuse, daily use 06/03/2016  . Situational anxiety 06/03/2016  . Asthma 05/26/2016  . Atopic eczema 05/26/2016  . Screening 04/29/2015  . 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 Allergic rhinitis, seasonal 07/14/2014    Past Surgical History:  Procedure Laterality Date  . COLONOSCOPY WITH PROPOFOL N/A 05/26/2017   Procedure: COLONOSCOPY WITH PROPOFOL;  Surgeon: [name], MD;  Location: [facility] ENDOSCOPY;  Service: Gastroenterology;  Laterality: N/A;  . ESOPHAGOGASTRODUODENOSCOPY (EGD) WITH PROPOFOL N/A 05/26/2017   Procedure: ESOPHAGOGASTRODUODENOSCOPY (EGD) WITH PROPOFOL;  Surgeon: [name], MD;  Location: [facility] ENDOSCOPY;  Service: Gastroenterology;  Laterality: N/A;  . None      Prior to Admission medications   Medication Sig Start Date End Date Taking? Authorizing Provider  Cetirizine HCl (ZYRTEC ALLERGY) 10 MG CAPS Take 1 tablet by mouth daily.    [provider]  fluticasone (FLONASE) 50 MCG/ACT nasal spray USE 1 SPRAY IN EACH NOSTRIL AS NEEDED Patient not taking: Reported on 10/13/2017 01/29/17   Birdie Sons, MD  hydrOXYzine (ATARAX/VISTARIL) 25 MG tablet Take 25 mg by mouth 2 (two) times daily as needed for anxiety.    [provider]  LORazepam (ATIVAN) 1 MG tablet Take 1 tablet (1 mg total) by mouth every 8 (eight) hours as needed for anxiety. 09/09/17 09/09/18  Delman Kitten, MD  OLANZapine (ZYPREXA) 2.5 MG tablet Take 1 tablet (2.5 mg total) by mouth at bedtime. Patient not taking: Reported on 10/13/2017 09/09/17 09/09/18  Delman Kitten, MD  omeprazole (PRILOSEC OTC) 20 MG tablet Take 20 mg by mouth daily.  [provider]  triamcinolone cream (KENALOG) 0.1 % APPLY TO RASH EXTERNALLY 2 TIMES A DAY Patient not taking: Reported on 10/13/2017 08/15/16   Birdie Sons, MD  valACYclovir (VALTREX) 1000 MG tablet TAKE 2 TABLETS BY MOUTH TWICE A DAY AS NEEDED Patient not taking: Reported on 10/13/2017 09/23/15   Birdie Sons, MD    Allergies Shellfish allergy  Family History  Problem Relation Age of Onset  . Hypertension Father   . Allergies Father   . Osteoarthritis Father   .  Depression Father   . Allergies Mother   . Asthma Mother   . Migraines Mother   . Asthma Paternal Uncle   . Lung cancer Maternal Grandfather   . Bipolar disorder Paternal Grandmother   . Alzheimer's disease Paternal Grandfather   . CAD Other     Social History Social History   Tobacco Use  . Smoking status: Never Smoker  . Smokeless tobacco: Never Used  Substance Use Topics  . Alcohol use: No  . Drug use: Yes    Frequency: 14.0 times per week    Types: Marijuana    Comment: used everyday    Review of Systems  Constitutional: No fever/chills Eyes: No visual changes. ENT: No sore throat. Cardiovascular: Denies chest pain. Respiratory: Denies shortness of breath. Gastrointestinal: No abdominal pain.  No nausea, no vomiting.  No diarrhea.  No constipation. Genitourinary: Negative for dysuria. Musculoskeletal: Negative for back pain. Skin: Negative for rash. Neurological: Negative for headaches, focal weakness or numbness. Psych: Insomnia, depression, anxiety  ____________________________________________   PHYSICAL EXAM:  VITAL SIGNS: ED Triage Vitals [10/16/17 0512]  Enc Vitals Group     BP (!) 150/92     Pulse Rate 74     Resp 16     Temp 98.1 F (36.7 C)     Temp Source Oral     SpO2 99 %     Weight      Height      Head Circumference      Peak Flow      Pain Score      Pain Loc      Pain Edu?      Excl. in [place]?     Constitutional: Alert and oriented. Well appearing and in no acute distress. Eyes: Conjunctivae are normal. PERRL. EOMI. Head: Atraumatic. Nose: No congestion/rhinnorhea. Mouth/Throat: Mucous membranes are moist.  Oropharynx non-erythematous. Cardiovascular: Normal rate, regular rhythm. Grossly normal heart sounds.  Good peripheral circulation. Respiratory: Normal respiratory effort.  No retractions. Lungs CTAB. Gastrointestinal: Soft and nontender. No distention. No abdominal bruits. No CVA tenderness. Musculoskeletal: No lower  extremity tenderness nor edema.   Neurologic:  Normal speech and language.  Skin:  Skin is warm, dry and intact.  Psychiatric: Mood and affect are normal.   ____________________________________________   LABS (all labs ordered are listed, but only abnormal results are displayed)  Labs Reviewed  COMPREHENSIVE METABOLIC PANEL - Abnormal; Notable for the following components:      Result Value   Glucose, Bld 102 (*)    Total Protein 8.3 (*)    Albumin 5.3 (*)    All other components within normal limits  ACETAMINOPHEN LEVEL - Abnormal; Notable for the following components:   Acetaminophen (Tylenol), Serum <10 (*)    All other components within normal limits  URINE DRUG SCREEN, QUALITATIVE ([facility] ONLY) - Abnormal; Notable for the following components:   Tricyclic, Ur Screen POSITIVE (*)    Cannabinoid 50 Ng, Ur  [place] POSITIVE (*)    All other components within normal limits  ETHANOL  SALICYLATE LEVEL  CBC   ____________________________________________  EKG  none ____________________________________________  RADIOLOGY  No results found.  ____________________________________________   PROCEDURES  Procedure(s) performed: None  Procedures  Critical Care performed: No  ____________________________________________   INITIAL IMPRESSION / ASSESSMENT AND PLAN / ED COURSE  As part of my medical decision making, I reviewed the following data within the electronic MEDICAL RECORD NUMBER Notes from prior ED visits and [place] Controlled Substance Database   This is a 22 year old male who comes into the hospital today with some insomnia as well as some problems with anxiety and depression.  Although the patient is not suicidal or homicidal he states that he wants someone to speak to.  He has been off his medications for about a week and again having trouble sleeping.  I will give the patient a dose of Zyprexa here in the emergency department and I will have him evaluated by psych.       ____________________________________________   FINAL CLINICAL IMPRESSION(S) / ED DIAGNOSES  Final diagnoses:  Insomnia, unspecified type  Episode of recurrent major depressive disorder, unspecified depression episode severity ([place])  Anxiety     ED Discharge Orders    None       Note:  This document was prepared using Dragon voice recognition software and may include unintentional dictation errors.    Loney Hering, MD 10/16/17 [phone]

## 2017-10-16 NOTE — ED Notes (Signed)
Two bags retrieved from [employer]. Both bags given to pt and pt is changing clothes at this time.

## 2017-10-16 NOTE — ED Notes (Signed)
BEHAVIORAL HEALTH ROUNDING Patient sleeping: No. Patient alert and oriented: yes Behavior appropriate: Yes.  ; If no, describe:  Nutrition and fluids offered: Yes  Toileting and hygiene offered: Yes  Sitter present: not applicable Law enforcement present: Yes  

## 2017-10-16 NOTE — ED Notes (Signed)
Report to Randall Hiss, patient to [facility] 4.

## 2017-10-16 NOTE — ED Notes (Signed)

## 2017-10-16 NOTE — ED Notes (Signed)
Resting in bed watching TV. Med taken with juice. States wishes to eat breakfast later. Left in room. Await psychiatry consult.

## 2017-10-16 NOTE — ED Notes (Signed)
Pt ambulated to bathroom with no assistance.  

## 2017-10-16 NOTE — ED Notes (Signed)
Interviewing with psychiatry sitting up in bed in hall.

## 2017-10-16 NOTE — ED Notes (Signed)

## 2017-10-16 NOTE — ED Notes (Signed)
BEHAVIORAL HEALTH ROUNDING Patient sleeping: Yes.   Patient alert and oriented: not applicable Behavior appropriate: Yes.  ; If no, describe:  Nutrition and fluids offered: Yes  Toileting and hygiene offered: Yes  Sitter present: not applicable Law enforcement present: Yes

## 2017-10-16 NOTE — Telephone Encounter (Signed)
Pt states that he was seen at [facility] this morning and was advised that he should be put in an in patient facility for extreme high anxiety.He would like to get your opinon on this

## 2017-10-16 NOTE — ED Notes (Signed)
BEHAVIORAL HEALTH ROUNDING  Patient sleeping: No.  Patient alert and oriented: yes  Behavior appropriate: Yes. ; If no, describe:  Nutrition and fluids offered: Yes  Toileting and hygiene offered: Yes  Sitter present: not applicable, Q 15 min safety rounds and observation.  Law enforcement present: Yes ODS  

## 2017-10-16 NOTE — Consult Note (Signed)
[facility] Consult   Reason for Consult: Consult for 22 year old man with history of anxiety comes in for symptoms of anxiety and depression Referring Physician: Jimmye Norman Patient Identification: DEMARIS LEAVELL MRN:  [id number] Principal Diagnosis: Severe recurrent major depression without psychotic features [facility]) Diagnosis:   Patient Active Problem List   Diagnosis Date Noted  . Severe recurrent major depression without psychotic features ([place]) [F33.2] 10/16/2017    Priority: High  . Panic disorder [F41.0] 10/16/2017    Priority: Medium  . Cannabis abuse, daily use [F12.10] 06/03/2016    Priority: Medium  . Cannabis use disorder, severe, dependence ([place]) [F12.20] 04/21/2017  . Hallucinogenic mushrooms use disorder, severe ([place]) [F16.20] 04/21/2017  . Situational anxiety [F41.8] 06/03/2016  . Asthma [J45.909] 05/26/2016  . Atopic eczema [L20.9] 05/26/2016  . Screening [Z13.9] 04/29/2015  . 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 Allergic rhinitis, seasonal [J30.2] 07/14/2014    Total Time spent with patient: 1 hour  Subjective:   WYNDHAM SANTILLI is a 22 y.o. male patient admitted with "I made some bad choices and now I am feeling like I am having a panic attack".  HPI: Patient interviewed chart reviewed.  22 year old man came to the emergency room with multiple symptoms of anxiety and depression.  He says he has not slept well for at least a week.  Hardly sleeping at all.  For several days during the daytime he feels like he is panicky and having an anxiety attack that is going on continuously.  Nerves feel so bad that he cannot focus or think clearly.  Mood feels helpless and hopeless.  No acute suicidal ideation but feels he does not know what to do to take care of himself.  No plan to hurt himself or anyone else.  No frank hallucinations.  Patient smokes marijuana several times every day and has been doing so for years.  In the last  few days he has been using Adderall and Xanax that he has obtained illegally as well although not every day.  Patient is not currently taking any prescribed active psychiatric medicine.  Major stress includes that his parents just moved to [place] about 3 weeks ago leaving him here to take care of their house and manage things financially.  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  Medical history: No significant known medical problems outside of mental health  Substance abuse history: Patient has been smoking pot several times a day for years now.  He has been told in the past that he needs to stop.  He tried quitting cold [place] for a couple of weeks but of course felt worse for a while.  Has never engaged in any kind of substance abuse treatment.  Does not drink alcohol.  Admits to recent use of Adderall and Xanax but says he has only used Xanax about 1 mg 3 times a week.  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  Past Psychiatric History: No previous hospitalizations.  No history of suicide attempts no history of frank psychosis.  Medicines that have been tried in the past include Lexapro and clonazepam which she did not feel helped but there is not much documentation of, Seroquel prescribed by a more recent  outpatient doctor which she felt made him feel worse.  Risk to Self: Is patient at risk for suicide?: No Risk to Others:   Prior Inpatient Therapy:   Prior Outpatient Therapy:    Past Medical History:  Past Medical History:  Diagnosis Date  . Allergy   . Anxiety   . Asthma   . Seasonal allergies     Past Surgical History:  Procedure Laterality Date  . COLONOSCOPY WITH PROPOFOL N/A 05/26/2017   Procedure: COLONOSCOPY WITH  PROPOFOL;  Surgeon: [name], MD;  Location: [facility] ENDOSCOPY;  Service: Gastroenterology;  Laterality: N/A;  . ESOPHAGOGASTRODUODENOSCOPY (EGD) WITH PROPOFOL N/A 05/26/2017   Procedure: ESOPHAGOGASTRODUODENOSCOPY (EGD) WITH PROPOFOL;  Surgeon: [name], MD;  Location: [facility] ENDOSCOPY;  Service: Gastroenterology;  Laterality: N/A;  . None     Family History:  Family History  Problem Relation Age of Onset  . Hypertension Father   . Allergies Father   . Osteoarthritis Father   . Depression Father   . Allergies Mother   . Asthma Mother   . Migraines Mother   . Asthma Paternal Uncle   . Lung cancer Maternal Grandfather   . Bipolar disorder Paternal Grandmother   . Alzheimer's disease Paternal Grandfather   . CAD Other    Family Psychiatric  History: He says his father's side of the family has multiple people with mental health problems including a grandmother with bipolar disorder and a history of suicide attempts as well as several people with substance abuse issues Social History:  Social History   Substance and Sexual Activity  Alcohol Use No     Social History   Substance and Sexual Activity  Drug Use Yes  . Frequency: 14.0 times per week  . Types: Marijuana   Comment: used everyday    Social History   Socioeconomic History  . Marital status: Single    Spouse name: None  . Number of children: None  . Years of education: None  . Highest education level: None  Social Needs  . Financial resource strain: None  . Food insecurity - worry: None  . Food insecurity - inability: None  . Transportation needs - medical: None  . Transportation needs - non-medical: None  Occupational History  . None  Tobacco Use  . Smoking status: Never Smoker  . Smokeless tobacco: Never Used  Substance and Sexual Activity  . Alcohol use: No  . Drug use: Yes    Frequency: 14.0 times per week    Types: Marijuana    Comment: used everyday  . Sexual activity: Not Currently  Other Topics  Concern  . None  Social History Narrative   Lives with parents in 2 story home.     Student at [employer], sophomore. 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            Additional Social History:    Allergies:   Allergies  Allergen Reactions  . Shellfish Allergy Rash    Labs:  Results for orders placed or performed during the hospital encounter of 10/16/17 (from the past 48 hour(s))  Comprehensive metabolic panel     Status: Abnormal   Collection Time: 10/16/17  5:11 AM  Result Value Ref Range   Sodium 139 135 - 145 mmol/L   Potassium 3.9 3.5 - 5.1 mmol/L   Chloride 101 101 - 111 mmol/L   CO2 27 22 - 32 mmol/L   Glucose, Bld 102 (  H) 65 - 99 mg/dL   BUN 13 6 - 20 mg/dL   Creatinine, Ser 0.77 0.61 - 1.24 mg/dL   Calcium 9.4 8.9 - 10.3 mg/dL   Total Protein 8.3 (H) 6.5 - 8.1 g/dL   Albumin 5.3 (H) 3.5 - 5.0 g/dL   AST 21 15 - 41 U/L   ALT 18 17 - 63 U/L   Alkaline Phosphatase 40 38 - 126 U/L   Total Bilirubin 1.2 0.3 - 1.2 mg/dL   GFR calc non Af Amer >60 >60 mL/min   GFR calc Af Amer >60 >60 mL/min    Comment: (NOTE) The eGFR has been calculated using the CKD EPI equation. This calculation has not been validated in all clinical situations. eGFR's persistently <60 mL/min signify possible Chronic Kidney Disease.    Anion gap 11 5 - 15    Comment: Performed at [facility], [address] [place], [place] [address]  Ethanol     Status: None   Collection Time: 10/16/17  5:11 AM  Result Value Ref Range   Alcohol, Ethyl (B) <10 <10 mg/dL    Comment:        LOWEST DETECTABLE LIMIT FOR SERUM ALCOHOL IS 10 mg/dL FOR MEDICAL PURPOSES ONLY Performed at [facility], [address] [place], [place] [address]   Salicylate level     Status: None   Collection Time: 10/16/17  5:11 AM  Result Value Ref Range   Salicylate Lvl <6.9 2.8 - 30.0 mg/dL    Comment: Performed at [facility], [address] [place], [place] [address]  Acetaminophen level     Status: Abnormal   Collection Time: 10/16/17  5:11 AM  Result Value Ref Range   Acetaminophen (Tylenol), Serum <10 (L) 10 - 30 ug/mL    Comment:        THERAPEUTIC CONCENTRATIONS VARY SIGNIFICANTLY. A RANGE OF 10-30 ug/mL MAY BE AN EFFECTIVE CONCENTRATION FOR MANY PATIENTS. HOWEVER, SOME ARE BEST TREATED AT CONCENTRATIONS OUTSIDE THIS RANGE. ACETAMINOPHEN CONCENTRATIONS >150 ug/mL AT 4 HOURS AFTER INGESTION AND >50 ug/mL AT 12 HOURS AFTER INGESTION ARE OFTEN ASSOCIATED WITH TOXIC REACTIONS. Performed at [facility], [address] [place], [place] [address]   cbc     Status: Abnormal   Collection Time: 10/16/17  5:11 AM  Result Value Ref Range   WBC 5.8 3.8 - 10.6 K/uL   RBC 5.48 4.40 - 5.90 MIL/uL   Hemoglobin 16.4 13.0 - 18.0 g/dL   HCT 48.3 40.0 - 52.0 %   MCV 88.1 80.0 - 100.0 fL   MCH 29.8 26.0 - 34.0 pg   MCHC 33.8 32.0 - 36.0 g/dL   RDW 13.1 11.5 - 14.5 %   Platelets 148 (L) 150 - 440 K/uL    Comment: PLATELET COUNT CONFIRMED BY SMEAR Performed at [facility], [address], [place], [place] [address]   Urine Drug Screen, Qualitative     Status: Abnormal   Collection Time: 10/16/17  5:11 AM  Result Value Ref Range   Tricyclic, Ur Screen POSITIVE (A) NONE DETECTED   Amphetamines, Ur Screen NONE DETECTED NONE DETECTED   MDMA (Ecstasy)Ur Screen NONE DETECTED NONE DETECTED   Cocaine Metabolite,Ur [facility] NONE DETECTED NONE DETECTED   Opiate, Ur Screen NONE DETECTED NONE DETECTED   Phencyclidine (PCP) Ur S NONE DETECTED NONE DETECTED   Cannabinoid 50 Ng, Ur [place] POSITIVE (A) NONE DETECTED   Barbiturates, Ur Screen NONE DETECTED NONE DETECTED   Benzodiazepine,  Ur Scrn NONE DETECTED NONE DETECTED   Methadone Scn, Ur NONE DETECTED NONE DETECTED    Comment: (NOTE) Tricyclics + metabolites, urine    Cutoff 1000 ng/mL Amphetamines + metabolites, urine  Cutoff 1000 ng/mL MDMA (Ecstasy),  urine              Cutoff 500 ng/mL Cocaine Metabolite, urine          Cutoff 300 ng/mL Opiate + metabolites, urine        Cutoff 300 ng/mL Phencyclidine (PCP), urine         Cutoff 25 ng/mL Cannabinoid, urine                 Cutoff 50 ng/mL Barbiturates + metabolites, urine  Cutoff 200 ng/mL Benzodiazepine, urine              Cutoff 200 ng/mL Methadone, urine                   Cutoff 300 ng/mL The urine drug screen provides only a preliminary, unconfirmed analytical test result and should not be used for non-medical purposes. Clinical consideration and professional judgment should be applied to any positive drug screen result due to possible interfering substances. A more specific alternate chemical method must be used in order to obtain a confirmed analytical result. Gas chromatography / mass spectrometry (GC/MS) is the preferred confirmat ory method. Performed at [facility], [address] [place], [place] [address]     No current facility-administered medications for this encounter.    Current Outpatient Medications  Medication Sig Dispense Refill  . diphenhydrAMINE (BENADRYL) 25 mg capsule Take 25-50 mg by mouth as directed.    . hydrOXYzine (ATARAX/VISTARIL) 25 MG tablet Take 25 mg by mouth 2 (two) times daily as needed for anxiety.    Marland Kitchen FLUoxetine (PROZAC) 20 MG capsule Take 1 capsule (20 mg total) by mouth daily. 30 capsule 1  . fluticasone (FLONASE) 50 MCG/ACT nasal spray USE 1 SPRAY IN EACH NOSTRIL AS NEEDED (Patient not taking: Reported on 10/13/2017) 16 g 2  . LORazepam (ATIVAN) 1 MG tablet Take 1 tablet (1 mg total) by mouth every 8 (eight) hours as needed for anxiety. (Patient not taking: Reported on 10/16/2017) 6 tablet 0  . OLANZapine (ZYPREXA) 2.5 MG tablet Take 1 tablet (2.5 mg total) by mouth at bedtime. (Patient not taking: Reported on 10/13/2017) 30 tablet 0  . traZODone (DESYREL) 100 MG tablet Take 1 tablet (100 mg total) by mouth at bedtime as needed  for sleep. 30 tablet 1  . triamcinolone cream (KENALOG) 0.1 % APPLY TO RASH EXTERNALLY 2 TIMES A DAY (Patient not taking: Reported on 10/13/2017) 60 g 1  . valACYclovir (VALTREX) 1000 MG tablet TAKE 2 TABLETS BY MOUTH TWICE A DAY AS NEEDED (Patient not taking: Reported on 10/13/2017) 8 tablet 5    Musculoskeletal: Strength & Muscle Tone: within normal limits Gait & Station: normal Patient leans: N/A  Psychiatric Specialty Exam: Physical Exam  Nursing note and vitals reviewed. Constitutional: He appears well-developed and well-nourished.  HENT:  Head: Normocephalic and atraumatic.  Eyes: Conjunctivae are normal. Pupils are equal, round, and reactive to light.  Neck: Normal range of motion.  Cardiovascular: Regular rhythm and normal heart sounds.  Respiratory: Effort normal. No respiratory distress.  GI: Soft.  Musculoskeletal: Normal range of motion.  Neurological: He is alert.  Skin: Skin is warm and dry.  Psychiatric: Judgment normal. His mood appears anxious. His speech is delayed. He is  slowed. Thought content is not paranoid. Cognition and memory are normal. He exhibits a depressed mood. He expresses no homicidal and no suicidal ideation.    Review of Systems  Constitutional: Negative.   HENT: Negative.   Eyes: Negative.   Respiratory: Negative.   Cardiovascular: Negative.   Gastrointestinal: Negative.   Musculoskeletal: Negative.   Skin: Negative.   Neurological: Negative.   Psychiatric/Behavioral: Positive for depression and substance abuse. Negative for hallucinations, memory loss and suicidal ideas. The patient is nervous/anxious and has insomnia.     Blood pressure (!) 150/92, pulse 74, temperature 98.1 F (36.7 C), temperature source Oral, resp. rate 16, SpO2 99 %.There is no height or weight on file to calculate BMI.  General Appearance: Casual  Eye Contact:  Good  Speech:  Clear and Coherent  Volume:  Decreased  Mood:  Anxious and Dysphoric  Affect:  Congruent   Thought Process:  Goal Directed  Orientation:  Full (Time, Place, and Person)  Thought Content:  Logical  Suicidal Thoughts:  No  Homicidal Thoughts:  No  Memory:  Immediate;   Fair Recent;   Fair Remote;   Fair  Judgement:  Fair  Insight:  Fair  Psychomotor Activity:  Psychomotor Retardation  Concentration:  Concentration: Fair  Recall:  [employer] of Knowledge:  Fair  Language:  Fair  Akathisia:  No  Handed:  Right  AIMS (if indicated):     Assets:  Communication Skills Desire for Improvement Housing Physical Health Resilience  ADL's:  Intact  Cognition:  WNL  Sleep:        Treatment Plan Summary: Daily contact with patient to assess and evaluate symptoms and progress in treatment, Medication management and Plan 22 year old man who presented to the emergency room with symptoms of depression and anxiety.  Probably has a major depression severe without psychotic features.  He is however not reporting any acute suicidal ideation and has not done anything to endanger himself and does not appear to be actively psychotic.  Patient does not meet commitment criteria.  Because of his lack of outpatient resources currently I offered him the opportunity to consider inpatient treatment in [place].  A bed was available but the patient refused to go saying that he felt it would make him feel worse.  We reviewed the pros and cons of that and I recommended that he go there but he still refuses.  Alternatively I will give him a prescription for Prozac 20 mg/day and trazodone 100 mg per night and he is planning to follow-up with either a referral from his primary care doctor or [place].  Patient was reminded that he can always come back to the emergency room if needed.  We have reviewed his substance abuse issues and I have explained the pathology of daily marijuana use and encouraged him to make that a plan to gradually decrease and try to get off his use of it and to stop abusing other  drugs.  Disposition: No evidence of imminent risk to self or others at present.   Supportive therapy provided about ongoing stressors. Discussed crisis plan, support from social network, calling 911, coming to the Emergency Department, and calling Suicide Hotline.  Alethia Berthold, MD 10/16/2017 1:39 PM

## 2017-10-16 NOTE — ED Triage Notes (Signed)
Patient has hx of anxiety and was on Zyprexa and Ativan but 5 days ago ran out of medication.  He bought Xanax from a friend and started taking Adderall.  Patient stated he has not slept in 3 days.  He saw his PCP and is they are working on getting him a referral to see a psychiatrist, but he feels the need to talk to someone sooner.  Patient has been having panic attacks but does not have any SI/HI.  Patient calm and cooperative during triage.

## 2017-10-16 NOTE — ED Notes (Signed)
Patient appears to be sleeping. Left undisturbed due to desire to catch up on sleep states earlier.

## 2017-10-16 NOTE — ED Notes (Signed)
Pt given lunch tray.

## 2017-10-17 ENCOUNTER — Encounter: Payer: Self-pay | Admitting: Family Medicine

## 2017-10-17 ENCOUNTER — Ambulatory Visit: Payer: Commercial Managed Care - PPO | Admitting: Family Medicine

## 2017-10-17 VITALS — BP 120/70 | HR 79 | Temp 98.5°F | Resp 16 | Wt 149.2 lb

## 2017-10-17 DIAGNOSIS — G4700 Insomnia, unspecified: Secondary | ICD-10-CM

## 2017-10-17 DIAGNOSIS — F1910 Other psychoactive substance abuse, uncomplicated: Secondary | ICD-10-CM

## 2017-10-17 DIAGNOSIS — F419 Anxiety disorder, unspecified: Secondary | ICD-10-CM

## 2017-10-17 DIAGNOSIS — F329 Major depressive disorder, single episode, unspecified: Secondary | ICD-10-CM

## 2017-10-17 DIAGNOSIS — F32A Depression, unspecified: Secondary | ICD-10-CM

## 2017-10-17 MED ORDER — QUETIAPINE FUMARATE 25 MG PO TABS: 25.0000 mg | ORAL_TABLET | Freq: Every day | ORAL | 0 refills | Status: DC

## 2017-10-17 NOTE — Progress Notes (Signed)
Patient: Stuart Harper Male    DOB: [birth year]   22 y.o.   MRN: [id number] Visit Date: 10/17/2017  Today's Provider: Lelon Huh, MD   Chief Complaint  Patient presents with  . Follow-up    ED   Subjective:    HPI  Follow Up ER Visit  Patient is here for ER follow up.  He was seen yesterday for extreme anxiet and inability to sleep. He was given a dose of Zyprexa in the ER and prescribed trazodone and fluoxetine. He took one dose of trazodone last night and states he was able to sleep for 3-4 hours, which is more than he has slept for the last week. He admits to getting Adderall and ativan off the streets and has a long history of mariguana use. He was seen here last week and initiated referral to psychiatry. His preference was to not return to [facility] where he was seen by Dr. Einar Harper in the past. We tried referred to Dr. Nicolasa Harper but she is out of network.   He still feels very anxious and understands that his use of Adderall and ativan off the street is probably exacerbating the problem. He is depressed but not acutely suicidal. He was advised of options of inpatient treatment when he was seen at the ER and has contact information if he decides to pursue, but he was not felt to be a candidate for involuntary commitment. Marland Kitchen  ------------------------------------------------------------------------------------     Allergies  Allergen Reactions  . Shellfish Allergy Rash     Current Outpatient Medications:  .  FLUoxetine (PROZAC) 20 MG capsule, Take 1 capsule (20 mg total) by mouth daily., Disp: 30 capsule, Rfl: 1 .  traZODone (DESYREL) 100 MG tablet, Take 1 tablet (100 mg total) by mouth at bedtime as needed for sleep., Disp: 30 tablet, Rfl: 1 .  diphenhydrAMINE (BENADRYL) 25 mg capsule, Take 25-50 mg by mouth as directed., Disp: , Rfl:  .  fluticasone (FLONASE) 50 MCG/ACT nasal spray, USE 1 SPRAY IN EACH NOSTRIL AS NEEDED (Patient not taking: Reported on 10/13/2017), Disp: 16 g,  Rfl: 2 .  hydrOXYzine (ATARAX/VISTARIL) 25 MG tablet, Take 25 mg by mouth 2 (two) times daily as needed for anxiety., Disp: , Rfl:  .  LORazepam (ATIVAN) 1 MG tablet, Take 1 tablet (1 mg total) by mouth every 8 (eight) hours as needed for anxiety. (Patient not taking: Reported on 10/16/2017), Disp: 6 tablet, Rfl: 0 .  OLANZapine (ZYPREXA) 2.5 MG tablet, Take 1 tablet (2.5 mg total) by mouth at bedtime. (Patient not taking: Reported on 10/13/2017), Disp: 30 tablet, Rfl: 0 .  triamcinolone cream (KENALOG) 0.1 %, APPLY TO RASH EXTERNALLY 2 TIMES A DAY (Patient not taking: Reported on 10/13/2017), Disp: 60 g, Rfl: 1 .  valACYclovir (VALTREX) 1000 MG tablet, TAKE 2 TABLETS BY MOUTH TWICE A DAY AS NEEDED (Patient not taking: Reported on 10/13/2017), Disp: 8 tablet, Rfl: 5  Review of Systems  Constitutional: Positive for fatigue.  Cardiovascular: Negative for chest pain and leg swelling.  Psychiatric/Behavioral: Positive for decreased concentration and sleep disturbance. Negative for self-injury and suicidal ideas. The patient is nervous/anxious.     Social History   Tobacco Use  . Smoking status: Never Smoker  . Smokeless tobacco: Never Used  Substance Use Topics  . Alcohol use: No   Objective:   BP 120/70 (BP Location: Right Arm, Patient Position: Sitting, Cuff Size: Normal)   Pulse 79   Temp 98.5 F (  36.9 C) (Oral)   Resp 16   Wt 149 lb 3.2 oz (67.7 kg)   SpO2 98%   BMI 20.24 kg/m  Vitals:   10/17/17 1601  BP: 120/70  Pulse: 79  Resp: 16  Temp: 98.5 F (36.9 C)  TempSrc: Oral  SpO2: 98%  Weight: 149 lb 3.2 oz (67.7 kg)     Physical Exam  General appearance: alert, well developed, well nourished, cooperative anxious appearing Head: Normocephalic, without obvious abnormality, atraumatic Respiratory: Respirations even and unlabored, normal respiratory rate Extremities: No gross deformities Skin: Skin color, texture, turgor normal. No rashes seen  Psych: Appropriate mood and  affect. Neurologic: Mental status: Alert, oriented to person, place, and time, thought content appropriate.     Assessment & Plan:     .1. Insomnia, unspecified type Counseled that it is critical not take any street drugs or alcohol which will likely exacerbate sleep disorders. He was prescribed Seroquel in the past which he reports worked well.  - QUEtiapine (SEROQUEL) 25 MG tablet; Take 1 tablet (25 mg total) by mouth at bedtime.  Dispense: 30 tablet; Refill: 0 - Ambulatory referral to Psychiatry  2. Anxiety He is not in network for Dr. Nicolasa Harper. [facility]. He would like to see Dr. Lind Harper - Ambulatory referral to Psychiatry  3. Depression, unspecified depression type Was prescribed fluoxetine from ER and counseled to expect it to take a few weeks to have an effect. To return to ER if he has any feelings of harming himself or others.  - Ambulatory referral to Psychiatry  4. Substance abuse ([place])  - Ambulatory referral to Psychiatry       Lelon Huh, MD  [place]

## 2017-10-18 ENCOUNTER — Telehealth: Payer: Self-pay | Admitting: Family Medicine

## 2017-10-18 NOTE — Telephone Encounter (Signed)
Stuart Harper states that pt is out of net work with their clinic and will not be able to see pt until next week.Referral was sent to [name].Pt advised and states he does have their contact information

## 2017-10-18 NOTE — Telephone Encounter (Signed)
Stuart Harper with [facility] stated pt contacted them advising that Dr. Caryn Section was referring pt and pt was trying to get in with [facility]. Hassan Rowan is requesting a call back to discuss the referral for psychiatry. Please advise. Thanks TNP

## 2017-10-18 NOTE — Telephone Encounter (Signed)
Seen in office 10-18-2017

## 2017-10-19 ENCOUNTER — Encounter: Payer: Self-pay | Admitting: Psychiatry

## 2017-10-19 ENCOUNTER — Inpatient Hospital Stay
Admission: AD | Admit: 2017-10-19 | Discharge: 2017-10-23 | DRG: 885 | Disposition: A | Discharge status: Home / Self Care | Payer: Commercial Managed Care - PPO | Source: Intra-hospital | Attending: Psychiatry | Admitting: Psychiatry

## 2017-10-19 ENCOUNTER — Other Ambulatory Visit: Payer: Self-pay

## 2017-10-19 ENCOUNTER — Ambulatory Visit (INDEPENDENT_AMBULATORY_CARE_PROVIDER_SITE_OTHER): Payer: Commercial Managed Care - PPO | Admitting: Psychiatry

## 2017-10-19 VITALS — BP 145/92 | HR 82 | Temp 98.6°F | Wt 146.2 lb

## 2017-10-19 DIAGNOSIS — F3341 Major depressive disorder, recurrent, in partial remission: Secondary | ICD-10-CM

## 2017-10-19 DIAGNOSIS — F314 Bipolar disorder, current episode depressed, severe, without psychotic features: Principal | ICD-10-CM | POA: Diagnosis present

## 2017-10-19 DIAGNOSIS — R45851 Suicidal ideations: Secondary | ICD-10-CM | POA: Diagnosis present

## 2017-10-19 DIAGNOSIS — Z818 Family history of other mental and behavioral disorders: Secondary | ICD-10-CM | POA: Diagnosis not present

## 2017-10-19 DIAGNOSIS — F411 Generalized anxiety disorder: Secondary | ICD-10-CM | POA: Diagnosis not present

## 2017-10-19 DIAGNOSIS — F1220 Cannabis dependence, uncomplicated: Secondary | ICD-10-CM | POA: Diagnosis present

## 2017-10-19 DIAGNOSIS — F13239 Sedative, hypnotic or anxiolytic dependence with withdrawal, unspecified: Secondary | ICD-10-CM | POA: Diagnosis present

## 2017-10-19 DIAGNOSIS — F429 Obsessive-compulsive disorder, unspecified: Secondary | ICD-10-CM | POA: Diagnosis present

## 2017-10-19 DIAGNOSIS — G4700 Insomnia, unspecified: Secondary | ICD-10-CM | POA: Diagnosis present

## 2017-10-19 DIAGNOSIS — F1320 Sedative, hypnotic or anxiolytic dependence, uncomplicated: Secondary | ICD-10-CM | POA: Diagnosis present

## 2017-10-19 DIAGNOSIS — F13939 Sedative, hypnotic or anxiolytic use, unspecified with withdrawal, unspecified: Secondary | ICD-10-CM | POA: Diagnosis present

## 2017-10-19 DIAGNOSIS — F1210 Cannabis abuse, uncomplicated: Secondary | ICD-10-CM | POA: Diagnosis present

## 2017-10-19 HISTORY — DX: Bipolar disorder, current episode depressed, severe, without psychotic features: F31.4

## 2017-10-19 MED ORDER — MAGNESIUM HYDROXIDE 400 MG/5ML PO SUSP: 30.0000 mL | Freq: Every day | ORAL | Status: DC | PRN

## 2017-10-19 MED ORDER — FLUOXETINE HCL 20 MG PO CAPS: 20.0000 mg | ORAL_CAPSULE | Freq: Every day | ORAL | Status: DC

## 2017-10-19 MED ORDER — CHLORDIAZEPOXIDE HCL 25 MG PO CAPS
25.0000 mg | ORAL_CAPSULE | Freq: Four times a day (QID) | ORAL | Status: AC
  Administered 2017-10-19 – 2017-10-22 (×12): 25 mg via ORAL
  Filled 2017-10-19 (×12): qty 1

## 2017-10-19 MED ORDER — QUETIAPINE FUMARATE 25 MG PO TABS
50.0000 mg | ORAL_TABLET | Freq: Every day | ORAL | Status: DC
  Administered 2017-10-19: 50 mg via ORAL
  Filled 2017-10-19: qty 2

## 2017-10-19 MED ORDER — ALUM & MAG HYDROXIDE-SIMETH 200-200-20 MG/5ML PO SUSP: 30.0000 mL | ORAL | Status: DC | PRN

## 2017-10-19 MED ORDER — ACETAMINOPHEN 325 MG PO TABS: 650.0000 mg | ORAL_TABLET | Freq: Four times a day (QID) | ORAL | Status: DC | PRN

## 2017-10-19 MED ORDER — QUETIAPINE FUMARATE 100 MG PO TABS: 100.0000 mg | ORAL_TABLET | Freq: Every day | ORAL | Status: DC

## 2017-10-19 MED ORDER — HYDROXYZINE HCL 25 MG PO TABS
25.0000 mg | ORAL_TABLET | Freq: Three times a day (TID) | ORAL | Status: DC | PRN
  Administered 2017-10-19 – 2017-10-20 (×3): 25 mg via ORAL
  Filled 2017-10-19 (×3): qty 1

## 2017-10-19 MED ORDER — TRAZODONE HCL 100 MG PO TABS: 100.0000 mg | ORAL_TABLET | Freq: Every day | ORAL | Status: DC

## 2017-10-19 NOTE — Tx Team (Signed)
Initial Treatment Plan 10/19/2017 6:25 PM Stuart Harper PET:[id number]    PATIENT STRESSORS: Medication change or noncompliance Substance abuse   PATIENT STRENGTHS: Average or above average intelligence Communication skills Work skills   PATIENT IDENTIFIED PROBLEMS: Anxiety disorder 10/19/2017  Depression 10/19/2017                   DISCHARGE CRITERIA:  Ability to meet basic life and health needs Adequate post-discharge living arrangements Medical problems require only outpatient monitoring Verbal commitment to aftercare and medication compliance  PRELIMINARY DISCHARGE PLAN: Attend aftercare/continuing care group Return to previous living arrangement  PATIENT/FAMILY INVOLVEMENT: This treatment plan has been presented to and reviewed with the patient, Stuart Harper, and/or family member,   The patient and family have been given the opportunity to ask questions and make suggestions.  Merlene Morse, RN 10/19/2017, 6:25 PM

## 2017-10-19 NOTE — Plan of Care (Signed)
  Not Progressing Education: Knowledge of [place] Education information/materials will improve 10/19/2017 2216 - Not Progressing by Derek Mound, RN Emotional status will improve 10/19/2017 2216 - Not Progressing by Derek Mound, RN Mental status will improve 10/19/2017 2216 - Not Progressing by Derek Mound, RN Verbalization of understanding the information provided will improve 10/19/2017 2216 - Not Progressing by Derek Mound, RN Education: Ability to state activities that reduce stress will improve 10/19/2017 2216 - Not Progressing by Derek Mound, RN Coping: Ability to identify and develop effective coping behavior will improve 10/19/2017 2216 - Not Progressing by Derek Mound, RN Self-Concept: Ability to identify factors that promote anxiety will improve 10/19/2017 2216 - Not Progressing by Derek Mound, RN Level of anxiety will decrease 10/19/2017 2216 - Not Progressing by Derek Mound, RN Ability to modify response to factors that promote anxiety will improve 10/19/2017 2216 - Not Progressing by Derek Mound, RN

## 2017-10-19 NOTE — Progress Notes (Addendum)
Patient found in day room upon my arrival. Patient is visible but not social. Patient affect and behavior is very depressed. Denies pain. Denies HI and AVH. Continues to report passive SI, with no plan or intent. Agrees to contract for safety. Compliant with HS medications, given first doses of quetiapine and Librium. Will monitor for efficacy. Q 15 minute checks maintained. Will continue to monitor throughout the shift.  Patient slept 7.25 hours. No apparent distress. Librium appears to be minimizing benzo WD S/Sx adequately. Patient reports sleeping better last night than he had been for last few weeks. Patient expresses gratitude for help he is receiving here. Awoke reporting immediate feelings of anxiety/agitation, given Vistaril PRN. Will monitor for efficacy. Will endorse care to oncoming shift.

## 2017-10-19 NOTE — Progress Notes (Signed)
Patient sad and anxious but cooperative during admission assessment. Patient denies SI/HI at this time. Patient denies AVH. Patient informed of fall risk status, fall risk assessed "low" at this time. Patient oriented to unit/staff/room. Patient denies any questions/concerns at this time. Patient safe on unit with Q15 minute checks for safety. Skin assessment and body search done,no contraband found.

## 2017-10-19 NOTE — BH Assessment (Signed)
Patient has been accepted to [facility].  Accepting physician is Dr. Bary Leriche.  Attending Physician will be Dr. Bary Leriche.  Patient has been assigned to room 325, by [place].  Call report to [phone].  Representative/Transfer Coordinator is Dispensing optician Patient pre-admitted by [facility] Patient Access Genella Rife).

## 2017-10-19 NOTE — B[facility] Group Notes (Signed)
[facility] Group Notes:  (Nursing/MHT/Case Management/Adjunct)  Date:  10/19/2017  Time:  9:59 PM  Type of Therapy:  Group Therapy  Participation Level:  Active  Participation Quality:  Appropriate  Affect:  Appropriate  Cognitive:  Appropriate  Insight:  Appropriate  Engagement in Group:  Engaged  Modes of Intervention:  Discussion  Summary of Progress/Problems:  Kandis Fantasia 10/19/2017, 9:59 PM

## 2017-10-19 NOTE — Progress Notes (Signed)
Psychiatric progress note  Patient Identification: TASHAUN OBEY MRN:  [id number] Date of Evaluation:  10/19/2017 Referral Source: Dr.Fisher Chief Complaint: difficulty sleeping, depressed, anxious and abusing multiple drugs Chief Complaint    Follow-up; Medication Refill    depressed , anxious  Visit Diagnosis: Cannabis use disorder severe with dependence, hallucinogenic mushrooms use disorder severe, denies anxiety disorder  History of Present Illness:  Patient is a 22 year old Caucasian male seen today for a f/u for depression and anxiety. Patient has not been seen in 4 months.  He reports today that he took Seroquel for  A week in October and stopped because he was groggy. Says some of his friends were a bad influence and he stopped his meds. He then bought Adderall and xanax and ended up in the ER. He reports since then he continues to smoke weed at 2-3 grams daily. He last used Adderall at 30mg  twice last Thursday and some xanax. States he felt very bad last Friday. Went to the ER this past Monday and was prescribed Prozac AND Trazodone. Patient reports that he has not been able to sleep on this combination. He also states that he is afraid that he is probably addicted to Xanax at this point. He has also been using Kratom. Patient reports he has not been sleeping for at least a week and mostly sleeping about 2 hours a night. Is endorsing suicidal thoughts and feeling despondent about his current condition. He is interested in obtaining help getting off these substances. Patient's parents are in [place] on the teaching mission and he reports that aware of his current situation.    Past Psychiatric History: No hospitalizations. No suicide attempts.  Previous Psychotropic Medications: Yes Lexapro  Substance Abuse History in the last 12 months:  Yes.    Consequences of Substance Abuse: Negative    Past Medical History:  Past Medical History:  Diagnosis Date  . Allergy   .  Anxiety   . Asthma   . Seasonal allergies     Past Surgical History:  Procedure Laterality Date  . COLONOSCOPY WITH PROPOFOL N/A 05/26/2017   Procedure: COLONOSCOPY WITH PROPOFOL;  Surgeon: [name], MD;  Location: [facility] ENDOSCOPY;  Service: Gastroenterology;  Laterality: N/A;  . ESOPHAGOGASTRODUODENOSCOPY (EGD) WITH PROPOFOL N/A 05/26/2017   Procedure: ESOPHAGOGASTRODUODENOSCOPY (EGD) WITH PROPOFOL;  Surgeon: [name], MD;  Location: [facility] ENDOSCOPY;  Service: Gastroenterology;  Laterality: N/A;  . None      Family Psychiatric History: Drug abuse and bipolar disorder in paternal grandmother, uncles. Father has depression, is on medication.  Family History:  Family History  Problem Relation Age of Onset  . Hypertension Father   . Allergies Father   . Osteoarthritis Father   . Depression Father   . Allergies Mother   . Asthma Mother   . Migraines Mother   . Asthma Paternal Uncle   . Lung cancer Maternal Grandfather   . Bipolar disorder Paternal Grandmother   . Alzheimer's disease Paternal Grandfather   . CAD Other     Social History:   Social History   Socioeconomic History  . Marital status: Single    Spouse name: None  . Number of children: 0  . Years of education: None  . Highest education level: Associate degree: occupational, Hotel manager, or vocational program  Social Needs  . Financial resource strain: Not hard at all  . Food insecurity - worry: Never true  . Food insecurity - inability: Never true  . Transportation needs - medical: No  .  Transportation needs - non-medical: No  Occupational History  . None  Tobacco Use  . Smoking status: Never Smoker  . Smokeless tobacco: Never Used  Substance and Sexual Activity  . Alcohol use: No  . Drug use: Yes    Frequency: 14.0 times per week    Types: Marijuana    Comment: used everyday  . Sexual activity: Not Currently  Other Topics Concern  . None  Social History Narrative   Lives with parents in 2 story home.      Student at [employer], sophomore. 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             Additional Social History: Lives with his family and working at [employer]. 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 Patient was born in [place] and raised in [place] for the first 10 years of his life. States that he had a happy childhood until then. States that once he returned to the [place] States his anxiety level started the increasing and felt bullied. Overall there has been some level of anxiety and OCD throughout his teenage years as well.  Allergies:   Allergies  Allergen Reactions  . Shellfish Allergy Rash    Metabolic Disorder Labs: No results found for: HGBA1C, MPG No results found for: PROLACTIN No results found for: CHOL, TRIG, HDL, CHOLHDL, VLDL, LDLCALC   Current Medications: Current Outpatient Medications  Medication Sig Dispense Refill  . cetirizine (ZYRTEC) 5 MG tablet Take by mouth.    Marland Kitchen FLUoxetine (PROZAC) 20 MG capsule Take 1 capsule (20 mg total) by mouth daily. 30 capsule 1  . fluticasone (FLONASE) 50 MCG/ACT nasal spray Place into the nose.    Marland Kitchen QUEtiapine (SEROQUEL) 25 MG tablet Take 1 tablet (25 mg total) by mouth at bedtime. 30 tablet 0  . traZODone (DESYREL) 100 MG tablet Take 1 tablet (100 mg total) by mouth at bedtime as needed for sleep. 30 tablet 1  . valACYclovir (VALTREX) 1000 MG tablet TAKE 2 TABLETS BY MOUTH TWICE A DAY AS NEEDED     No current facility-administered medications for this visit.     Neurologic: Headache: No Seizure: No Paresthesias:No  Musculoskeletal: Strength & Muscle Tone: within normal limits Gait & Station: normal Patient leans: N/A  Psychiatric Specialty Exam: ROS  Blood pressure (!) 145/92, pulse 82, temperature 98.6 F (37 C), temperature source Oral, weight 66.3 kg (146 lb 3.2 oz).Body mass index is 19.83  kg/m.  General Appearance: Casual  Eye Contact:  Fair  Speech:  Clear and Coherent  Volume:  low  Mood:  depressed  Affect:  constricted  Thought Process:  Coherent  Orientation:  Full (Time, Place, and Person)  Thought Content:  WDL  Suicidal Thoughts:  yes  Homicidal Thoughts:  No  Memory:  Immediate;   Fair Recent;   Fair Remote;   Fair  Judgement:  Fair  Insight:  Fair  Psychomotor Activity:  Normal  Concentration:  Concentration: Fair and Attention Span: Fair  Recall:  [employer] of Knowledge:Fair  Language: Fair  Akathisia:  No  Handed:  Right  AIMS (if indicated):  na  Assets:  Communication Skills Desire for Improvement Housing Physical Health Resilience Social Support Vocational/Educational  ADL's:  Intact  Cognition: WNL  Sleep:  Erratic sleep pattern    Treatment Plan Summary:  Patient  is a 22 year old Caucasian male with a long history of cannabis dependence and most recently acquired Xanax dependence and other substances. He is presenting after 4 months with noncompliance with his medications and treatment. In the past he has been very resistant to substance abuse treatment. Today he reports decompensating over the past few months and more so in the last week with suicidal thoughts and poor sleep. He is open to being hospitalized. He has been accepted at [facility] regional behavioral health unit by Dr.Puchilowska.  Generalized Anxiety Disorder Patient stopped lexapro 3 months ago.  Possible bipolar 2 disorder Patient has not been compliant with the seroquel.  Cannabis dependence We discussed how his daily cannabis use his contribution to his lack of motivation, depression and anxiety.   After his hospitalization recommend that patient follow up in the long-term substance abuse program either residential or an intensive outpatient program.  Elvin So, MD 1/31/20192:41 PM

## 2017-10-20 ENCOUNTER — Telehealth: Payer: Self-pay | Admitting: Emergency Medicine

## 2017-10-20 DIAGNOSIS — F1320 Sedative, hypnotic or anxiolytic dependence, uncomplicated: Secondary | ICD-10-CM

## 2017-10-20 DIAGNOSIS — F429 Obsessive-compulsive disorder, unspecified: Secondary | ICD-10-CM

## 2017-10-20 DIAGNOSIS — F13939 Sedative, hypnotic or anxiolytic use, unspecified with withdrawal, unspecified: Secondary | ICD-10-CM

## 2017-10-20 DIAGNOSIS — F1220 Cannabis dependence, uncomplicated: Secondary | ICD-10-CM

## 2017-10-20 DIAGNOSIS — F13239 Sedative, hypnotic or anxiolytic dependence with withdrawal, unspecified: Secondary | ICD-10-CM | POA: Diagnosis present

## 2017-10-20 HISTORY — DX: Sedative, hypnotic or anxiolytic use, unspecified with withdrawal, unspecified: F13.939

## 2017-10-20 HISTORY — DX: Sedative, hypnotic or anxiolytic dependence, uncomplicated: F13.20

## 2017-10-20 HISTORY — DX: Obsessive-compulsive disorder, unspecified: F42.9

## 2017-10-20 LAB — HEMOGLOBIN A1C
Hgb A1c MFr Bld: 5.1 % (ref 4.8–5.6)
MEAN PLASMA GLUCOSE: 99.67 mg/dL

## 2017-10-20 LAB — TESTOSTERONE,FREE AND TOTAL
TESTOSTERONE FREE: 13.3 pg/mL (ref 9.3–26.5)
Testosterone: 457 ng/dL (ref 264–916)

## 2017-10-20 LAB — LIPID PANEL
CHOL/HDL RATIO: 3.1 ratio
CHOLESTEROL: 126 mg/dL (ref 0–200)
HDL: 41 mg/dL (ref >40)
LDL Cholesterol: 73 mg/dL (ref 0–99)
TRIGLYCERIDES: 60 mg/dL (ref <150)
VLDL: 12 mg/dL (ref 0–40)

## 2017-10-20 LAB — TSH
TSH: 1.11 u[IU]/mL (ref 0.450–4.500)
TSH: 0.817 u[IU]/mL (ref 0.350–4.500)

## 2017-10-20 MED ORDER — DIVALPROEX SODIUM 500 MG PO DR TAB
500.0000 mg | DELAYED_RELEASE_TABLET | Freq: Three times a day (TID) | ORAL | Status: DC
  Administered 2017-10-20 – 2017-10-22 (×6): 500 mg via ORAL
  Filled 2017-10-20 (×6): qty 1

## 2017-10-20 MED ORDER — ARIPIPRAZOLE 5 MG PO TABS
5.0000 mg | ORAL_TABLET | Freq: Every day | ORAL | Status: DC
  Administered 2017-10-20: 5 mg via ORAL
  Filled 2017-10-20: qty 1

## 2017-10-20 MED ORDER — HYDROXYZINE HCL 50 MG PO TABS
50.0000 mg | ORAL_TABLET | Freq: Three times a day (TID) | ORAL | Status: DC | PRN
  Filled 2017-10-20: qty 1

## 2017-10-20 MED ORDER — GABAPENTIN 100 MG PO CAPS: 100.0000 mg | ORAL_CAPSULE | Freq: Three times a day (TID) | ORAL | Status: DC

## 2017-10-20 MED ORDER — QUETIAPINE FUMARATE 100 MG PO TABS
100.0000 mg | ORAL_TABLET | Freq: Every day | ORAL | Status: DC
Start: 2017-10-20 — End: 2017-10-22
  Administered 2017-10-20 – 2017-10-21 (×2): 100 mg via ORAL
  Filled 2017-10-20 (×2): qty 1

## 2017-10-20 MED ORDER — QUETIAPINE FUMARATE 25 MG PO TABS
50.0000 mg | ORAL_TABLET | Freq: Three times a day (TID) | ORAL | Status: DC
  Administered 2017-10-20 – 2017-10-22 (×5): 50 mg via ORAL
  Filled 2017-10-20 (×6): qty 2

## 2017-10-20 MED ORDER — FLUVOXAMINE MALEATE 50 MG PO TABS
50.0000 mg | ORAL_TABLET | Freq: Every day | ORAL | Status: AC
  Administered 2017-10-20: 50 mg via ORAL
  Filled 2017-10-20: qty 1

## 2017-10-20 MED ORDER — FLUVOXAMINE MALEATE 50 MG PO TABS
100.0000 mg | ORAL_TABLET | Freq: Every day | ORAL | Status: DC
  Administered 2017-10-21 – 2017-10-23 (×3): 100 mg via ORAL
  Filled 2017-10-20 (×3): qty 2

## 2017-10-20 NOTE — Tx Team (Addendum)
Interdisciplinary Treatment and Diagnostic Plan Update  10/20/2017 Time of Session: 10:55 AM NIKOLOS BILLIG MRN: [id number]  Principal Diagnosis: Bipolar I disorder, most recent episode depressed, severe without psychotic features ([place])  Secondary Diagnoses: Principal Problem:   Bipolar I disorder, most recent episode depressed, severe without psychotic features ([place]) Active Problems:   Cannabis use disorder, severe, dependence ([place])   Sedative, hypnotic or anxiolytic use disorder, severe, dependence ([place])   Benzodiazepine withdrawal ([place])   OCD (obsessive compulsive disorder)   Current Medications:  Current Facility-Administered Medications  Medication Dose Route Frequency Provider Last Rate Last Dose  . acetaminophen (TYLENOL) tablet 650 mg  650 mg Oral Q6H PRN Pucilowska, Jolanta B, MD      . alum & mag hydroxide-simeth (MAALOX/MYLANTA) 200-200-20 MG/5ML suspension 30 mL  30 mL Oral Q4H PRN Pucilowska, Jolanta B, MD      . chlordiazePOXIDE (LIBRIUM) capsule 25 mg  25 mg Oral QID Pucilowska, Jolanta B, MD   25 mg at 10/20/17 1702  . divalproex (DEPAKOTE) DR tablet 500 mg  500 mg Oral Q8H Pucilowska, Jolanta B, MD   500 mg at 10/20/17 1702  . [START ON 10/21/2017] fluvoxaMINE (LUVOX) tablet 100 mg  100 mg Oral QHS Pucilowska, Jolanta B, MD      . fluvoxaMINE (LUVOX) tablet 50 mg  50 mg Oral QHS Pucilowska, Jolanta B, MD      . hydrOXYzine (ATARAX/VISTARIL) tablet 50 mg  50 mg Oral TID PRN Pucilowska, Jolanta B, MD      . magnesium hydroxide (MILK OF MAGNESIA) suspension 30 mL  30 mL Oral Daily PRN Pucilowska, Jolanta B, MD      . QUEtiapine (SEROQUEL) tablet 100 mg  100 mg Oral QHS Pucilowska, Jolanta B, MD      . QUEtiapine (SEROQUEL) tablet 50 mg  50 mg Oral TID Pucilowska, Jolanta B, MD   50 mg at 10/20/17 1702   PTA Medications: Medications Prior to Admission  Medication Sig Dispense Refill Last Dose  . cetirizine (ZYRTEC) 5 MG tablet Take by mouth.   Taking  . FLUoxetine  (PROZAC) 20 MG capsule Take 1 capsule (20 mg total) by mouth daily. 30 capsule 1 Taking  . fluticasone (FLONASE) 50 MCG/ACT nasal spray Place into the nose.   Taking  . QUEtiapine (SEROQUEL) 25 MG tablet Take 1 tablet (25 mg total) by mouth at bedtime. 30 tablet 0 Taking  . traZODone (DESYREL) 100 MG tablet Take 1 tablet (100 mg total) by mouth at bedtime as needed for sleep. 30 tablet 1 Taking  . valACYclovir (VALTREX) 1000 MG tablet TAKE 2 TABLETS BY MOUTH TWICE A DAY AS NEEDED   Taking    Patient Stressors: Medication change or noncompliance Substance abuse  Patient Strengths: Average or above average intelligence Communication skills Work skills  Treatment Modalities: Medication Management, Group therapy, Case management,  1 to 1 session with clinician, Psychoeducation, Recreational therapy.   Physician Treatment Plan for Primary Diagnosis: Bipolar I disorder, most recent episode depressed, severe without psychotic features ([place]) Long Term Goal(s): Improvement in symptoms so as ready for discharge Improvement in symptoms so as ready for discharge   Short Term Goals: Ability to identify changes in lifestyle to reduce recurrence of condition will improve Ability to verbalize feelings will improve Ability to disclose and discuss suicidal ideas Ability to demonstrate self-control will improve Ability to identify and develop effective coping behaviors will improve Ability to maintain clinical measurements within normal limits will improve Compliance with prescribed medications will  improve Ability to identify triggers associated with substance abuse/mental health issues will improve Ability to demonstrate self-control will improve Ability to identify triggers associated with substance abuse/mental health issues will improve  Medication Management: Evaluate patient's response, side effects, and tolerance of medication regimen.  Therapeutic Interventions: 1 to 1 sessions, Unit Group  sessions and Medication administration.  Evaluation of Outcomes: Progressing  Physician Treatment Plan for Secondary Diagnosis: Principal Problem:   Bipolar I disorder, most recent episode depressed, severe without psychotic features ([place]) Active Problems:   Cannabis use disorder, severe, dependence ([facility])   Sedative, hypnotic or anxiolytic use disorder, severe, dependence ([place])   Benzodiazepine withdrawal ([place])   OCD (obsessive compulsive disorder)  Long Term Goal(s): Improvement in symptoms so as ready for discharge Improvement in symptoms so as ready for discharge   Short Term Goals: Ability to identify changes in lifestyle to reduce recurrence of condition will improve Ability to verbalize feelings will improve Ability to disclose and discuss suicidal ideas Ability to demonstrate self-control will improve Ability to identify and develop effective coping behaviors will improve Ability to maintain clinical measurements within normal limits will improve Compliance with prescribed medications will improve Ability to identify triggers associated with substance abuse/mental health issues will improve Ability to demonstrate self-control will improve Ability to identify triggers associated with substance abuse/mental health issues will improve     Medication Management: Evaluate patient's response, side effects, and tolerance of medication regimen.  Therapeutic Interventions: 1 to 1 sessions, Unit Group sessions and Medication administration.  Evaluation of Outcomes: Progressing   RN Treatment Plan for Primary Diagnosis: Bipolar I disorder, most recent episode depressed, severe without psychotic features ([place]) Long Term Goal(s): Knowledge of disease and therapeutic regimen to maintain health will improve  Short Term Goals: Ability to demonstrate self-control, Ability to disclose and discuss suicidal ideas, Ability to identify and develop effective coping behaviors will improve and  Compliance with prescribed medications will improve  Medication Management: RN will administer medications as ordered by provider, will assess and evaluate patient's response and provide education to patient for prescribed medication. RN will report any adverse and/or side effects to prescribing provider.  Therapeutic Interventions: 1 on 1 counseling sessions, Psychoeducation, Medication administration, Evaluate responses to treatment, Monitor vital signs and CBGs as ordered, Perform/monitor CIWA, COWS, AIMS and Fall Risk screenings as ordered, Perform wound care treatments as ordered.  Evaluation of Outcomes: Progressing   LCSW Treatment Plan for Primary Diagnosis: Bipolar I disorder, most recent episode depressed, severe without psychotic features ([place]) Long Term Goal(s): Safe transition to appropriate next level of care at discharge, Engage patient in therapeutic group addressing interpersonal concerns.  Short Term Goals: Engage patient in aftercare planning with referrals and resources, Increase emotional regulation and Increase skills for wellness and recovery  Therapeutic Interventions: Assess for all discharge needs, 1 to 1 time with Social worker, Explore available resources and support systems, Assess for adequacy in community support network, Educate family and significant other(s) on suicide prevention, Complete Psychosocial Assessment, Interpersonal group therapy.  Evaluation of Outcomes: Progressing   Progress in Treatment: Attending groups: Yes. Participating in groups: Yes. Taking medication as prescribed: Yes. Toleration medication: Yes. Family/Significant other contact made: No, will contact:  CSW will contact pt's roommates whom he has identified as his supports. Patient understands diagnosis: Yes. Discussing patient identified problems/goals with staff: Yes. Medical problems stabilized or resolved: Yes. Denies suicidal/homicidal ideation: Yes. Issues/concerns per  patient self-inventory: No. Other: n/a  New problem(s) identified: No, Describe:  No new  problems identified  New Short Term/Long Term Goal(s): Pt's goal for this hospitalization is "get rested and come out with a plan to change by habits"  Discharge Plan or Barriers: Pt has stated that he is not interested in a SA residential program, but is open to participation in outpatient residential services.  A barrier may be identifying a provider that accepts his insurance.  Pt will return home with his roommates and resume services with Dr. [name].  Reason for Continuation of Hospitalization: Anxiety Depression Medication stabilization Withdrawal symptoms  Estimated Length of Stay: 3-5 days  Recreational Therapy: Patient Stressors: Depression Patient Goal: Patient will identify 3 triggers for depression within 5 recreation therapy group sessions  Attendees: Patient: Brewster Wolters 10/20/2017 5:16 PM  Physician: Orson Slick, MD 10/20/2017 5:16 PM  Nursing: Elige Radon, RN 10/20/2017 5:16 PM  RN Care Manager: 10/20/2017 5:16 PM  Social Worker: Derrek Gu, LCSW 10/20/2017 5:16 PM  Recreational Therapist: Roanna Epley, LRT 10/20/2017 5:16 PM  Other:  10/20/2017 5:16 PM  Other:  10/20/2017 5:16 PM  Other: 10/20/2017 5:16 PM    Scribe for Treatment Team: Devona Konig, LCSW 10/20/2017 5:16 PM

## 2017-10-20 NOTE — Plan of Care (Signed)
Pt. Verbalizes understanding of provided education. Pt. Reports no pain this evening. Pt. Reports feeling, "worse" today then he did yesterday when asked how he is doing. Pt. Denies SI/HI. Pt. Verbally contracts for safety. Pt. States he can remain safe while on the unit.  Not Progressing Education: Emotional status will improve 10/20/2017 2357 - Not Progressing by Reyes Ivan, RN Mental status will improve 10/20/2017 2357 - Not Progressing by Reyes Ivan, RN Verbalization of understanding the information provided will improve 10/20/2017 2357 - Not Progressing by Reyes Ivan, RN   Progressing Education: Knowledge of [place] Education information/materials will improve 10/20/2017 2357 - Progressing by Reyes Ivan, RN Pain Managment: General experience of comfort will improve 10/20/2017 2357 - Progressing by Reyes Ivan, RN

## 2017-10-20 NOTE — Progress Notes (Signed)
Recreation Therapy Notes  Date: 02.01.2019  Time: 1:00 PM  Location: Craft Room  Behavioral response: Appropriate   Intervention Topic: Leisure  Discussion/Intervention: Group content today was focused on leisure. The group defined what leisure is and some positive leisure activities they participate in. Individuals identified the difference between good and bad leisure. Participants expressed how they feel after participating in the leisure of their choice. The group discussed how they go about picking a leisure activity and if others are involved in their leisure activities. The patient stated how many leisure activities they too choose from and reasons why it is important to have leisure time. Individuals participated in the intervention "Leisure Jeopardy" where they had a chance to identify new leisure activities as well as benefits of leisure. Clinical Observations/Feedback:  Patient came to group late due to unknown reasons.He stated that he likes to play music and sing during his leisure time. He expressed that he decides what leisure activity he want to participate in based off what his friends are doing. Individual participated in the intervention and was social with peers and staff during group.  Katrinia Straker LRT/CTRS         Twyla Dais 10/20/2017 2:10 PM

## 2017-10-20 NOTE — Telephone Encounter (Signed)
-----   Message from Birdie Sons, MD sent at 10/20/2017  7:57 AM EST ----- Normal thyroid functions and testosterone levels.

## 2017-10-20 NOTE — Telephone Encounter (Signed)
LMTCB

## 2017-10-20 NOTE — Progress Notes (Signed)
Recreation Therapy Notes  INPATIENT RECREATION THERAPY ASSESSMENT  Patient Details Name: Stuart Harper MRN: [id number] DOB: [birth year] Today's Date: 10/20/2017       Information Obtained From: Patient  Able to Participate in Assessment/Interview: Yes  Patient Presentation: Responsive  Reason for Admission (Per Patient): Active Symptoms  Patient Stressors: Other (Comment)(Depression)  Coping Skills:   Deep Breathing, Other (Comment)(Thinking about my goals)  Leisure Interests (2+):  Music - Listen, Games - Video games, Individual - TV, Social - Friends  Frequency of Recreation/Participation: Weekly  Awareness of Community Resources:  Yes  Community Resources:  Library  Current Use: Yes  If no, Barriers?:    Expressed Interest in [employer] Information:    Patient Main Form of Transportation: Car  Patient Strengths:  Analyze things  Patient Identified Areas of Improvement:  To be more motivated and over come bad habits  Current Recreation Participation:  N/A  Patient Goal for Hospitalization:  To get rest and identify my problems and leave with plan of improvement  City of Residence:  [place] of Residence:    Current SI (including self-harm):  No  Current HI:  No  Current AVH: No  Staff Intervention Plan: Group Attendance, Collaborate with Interdisciplinary Treatment Team  Consent to Intern Participation: N/A  Stuart Harper 10/20/2017, 2:28 PM

## 2017-10-20 NOTE — BHH Group Notes (Signed)
10/20/2017 9:30AM  Type of Therapy and Topic:  Group Therapy:  Feelings around Relapse and Recovery  Participation Level:  Active   Description of Group:    Patients in this group will discuss emotions they experience before and after a relapse. They will process how experiencing these feelings, or avoidance of experiencing them, relates to having a relapse. Facilitator will guide patients to explore emotions they have related to recovery. Patients will be encouraged to process which emotions are more powerful. They will be guided to discuss the emotional reaction significant others in their lives may have to patients' relapse or recovery. Patients will be assisted in exploring ways to respond to the emotions of others without this contributing to a relapse.  Therapeutic Goals: 1. Patient will identify two or more emotions that lead to a relapse for them 2. Patient will identify two emotions that result when they relapse 3. Patient will identify two emotions related to recovery 4. Patient will demonstrate ability to communicate their needs through discussion and/or role plays   Summary of Patient Progress: Actively and appropriately engaged in the group. Patient was able to provide support and validation to other group members.Patient practiced active listening when interacting with the facilitator and other group members Patient in still in the process of obtaining treatment goals. Stuart Harper spoke about how lack of rest caused him to relapse. "My goal is remember how awful this past week is so that I wont have to go through this again."    Therapeutic Modalities:   Cognitive Behavioral Therapy Solution-Focused Therapy Assertiveness Training Relapse Prevention Therapy   Darin Engels, [place] 10/20/2017 10:28 AM

## 2017-10-20 NOTE — BHH Counselor (Signed)
Adult Comprehensive Assessment  Patient ID: Stuart Harper, male   DOB: 10/29/1995, 22 y.o.   MRN: [id number]  Information Source: Information source: Patient  Current Stressors:  Educational / Learning stressors: None identified Employment / Job issues: Pt is currently unemployed.  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 Family Relationships: Pt's family consists of his parents and younger sister.  He also considers his two best friends and roommates as a part of his family. Financial / Lack of resources (include bankruptcy): Pt denies having any significant financial issues currently Housing / Lack of housing: Pt's housing is stable Physical health (include injuries & life threatening diseases): No significant physical health issues noted Social relationships: Pt shared that he has friends and pushes himself to meet new people Substance abuse: Pt has been abusing benzodiazapines, Adderrell, and cannabis Bereavement / Loss: Pt shared that he recently broke up with a girlfriend and it pains him that he cannot talk with her now  Living/Environment/Situation:  Living Arrangements: Non-relatives/Friends(Pt lives with two roommates in a house that is owned by his parents) Living conditions (as described by patient or guardian): "it's okay" How long has patient lived in current situation?: since 2005 What is atmosphere in current home: Comfortable, Supportive  Family History:  Marital status: Single Are you sexually active?: No What is your sexual orientation?: Heterosexual Has your sexual activity been affected by drugs, alcohol, medication, or emotional stress?: No Does patient have children?: No  Childhood History:  By whom was/is the patient raised?: Both parents Additional childhood history information: Pt shared that he was born in [place] and have lived in [place], [place], and [place].  His parents were on [place] trips when he was a  child as well as they moved to [place] when his mother wanted to work on obtaining a PhD from [employer]. Description of patient's relationship with caregiver when they were a child: Pt shared that his relationship with his parents is good.  They are both now living in [place]. Patient's description of current relationship with people who raised him/her: "good" How were you disciplined when you got in trouble as a child/adolescent?: Pt shared that he was never spanked just "punished" Does patient have siblings?: Yes Number of Siblings: 1 Description of patient's current relationship with siblings: Pt has one younger sister who lives in [place].  He doesn't talk to her much and only sees her when she visits [place] a few times a year Did patient suffer any verbal/emotional/physical/sexual abuse as a child?: No Did patient suffer from severe childhood neglect?: No Has patient ever been sexually abused/assaulted/raped as an adolescent or adult?: No Was the patient ever a victim of a crime or a disaster?: No Witnessed domestic violence?: No Has patient been effected by domestic violence as an adult?: No  Education:  Highest grade of school patient has completed: Pt received to BA degrees in [place] and Sales promotion account executive Studies Currently a student?: No Learning disability?: No  Employment/Work Situation:   Employment situation: Unemployed Patient's job has been impacted by current illness: Yes Describe how patient's job has been impacted: Pt shared that he is unsure if he still has a job.  He is assuming that he has been fired. What is the longest time patient has a held a job?: 9 months Where was the patient employed at that time?: [employer] Has patient ever been in the [employer]?: No Has patient ever served in combat?: No Did You Receive  Any Psychiatric Treatment/Services While in the Military?: No Are There Guns or Other Weapons in [place]?: No Are These Weapons Safely Secured?:  No Who Could Verify You Are Able To Have These Secured:: Pt denies having weapons in the home.  This can be confirmed by his two roommates  Financial Resources:   Financial resources: Income from employment, Private insurance(Pt shared that he also has some savings) Does patient have a Programmer, applications or guardian?: No  Alcohol/Substance Abuse:   What has been your use of drugs/alcohol within the last 12 months?: Pt has been abusing benzodiazepines, Adderall, and cannabis daily for the past 12 months If attempted suicide, did drugs/alcohol play a role in this?: No Alcohol/Substance Abuse Treatment Hx: Denies past history If yes, describe treatment: n/a Has alcohol/substance abuse ever caused legal problems?: No  Social Support System:   Pensions consultant Support System: Fair Astronomer System: Pt shared that he has some friends and that he forces himself to go out and meet new people in the community Type of faith/religion: None How does patient's faith help to cope with current illness?: None  Leisure/Recreation:   Leisure and Hobbies: Pt likes playing the guitar, singing, playing video games, watching TV  Strengths/Needs:   What things does the patient do well?: communicating with others, explaining things to people In what areas does patient struggle / problems for patient: having discipline and self control  Discharge Plan:   Does patient have access to transportation?: Yes Will patient be returning to same living situation after discharge?: Yes Currently receiving community mental health services: Yes (From Whom)(Pt sees Dr. [name] with [facility]) If no, would patient like referral for services when discharged?: Yes (What county?)(n/a) Does patient have financial barriers related to discharge medications?: (Pt stated that he is unsure at this point due to having limited financial means)  Summary/Recommendations:   Summary and  Recommendations (to be completed by the evaluator): Pt is a 22 yo male, currently residing in [place], [place], who has a long history of depression and anxiety to include OCD.  Pt was brought to the ER by his psychiatrist, Dr. [name] after reporting an increase in depression, anxiety, insomnia, and SI with plan to stab himself in the neck.  Pt was keeping a knife at this bedside.  Pt denies psychosis, A/VH.  Pt also reported that he has been abusing benzodiazepines, Adderall, and cannabis for the past year. Pt presented to the ER with withdrawal sxs and was started on a Librium taper.  Pt does not have any prior psychiatric hospitalizations.  Pt was dx with Bipolar I Disorder, Most Recent Episode Depressed without psychotic features.  Pt does not have a hx of manic episodes.  Recommendations for pt include crisis stabilization, therapeutic milieu, medication management, encouragement of attendance and participation in groups, and development of a comprehensive recovery plan.  Pt will return home with his two roommates following discharge and resume medication management with Dr. [name]. Pt is also interested in participating in psychotherapy.  Devona Konig, LCSW2/09/2017

## 2017-10-20 NOTE — H&P (Addendum)
Psychiatric Admission Assessment Adult  Patient Identification: Stuart Harper MRN:  [id number] Date of Evaluation:  10/20/2017 Chief Complaint:  Depression Principal Diagnosis: Bipolar I disorder, most recent episode depressed, severe without psychotic features ([place]) Diagnosis:   Patient Active Problem List   Diagnosis Date Noted  . Bipolar I disorder, most recent episode depressed, severe without psychotic features ([place]) [F31.4] 10/19/2017    Priority: High  . Sedative, hypnotic or anxiolytic use disorder, severe, dependence ([place]) [F13.20] 10/20/2017  . Benzodiazepine withdrawal ([place]) [F13.239] 10/20/2017  . OCD (obsessive compulsive disorder) [F42.9] 10/20/2017  . Severe recurrent major depression without psychotic features ([place]) [F33.2] 10/16/2017  . Panic disorder [F41.0] 10/16/2017  . Cannabis use disorder, severe, dependence ([place]) [F12.20] 04/21/2017  . Hallucinogenic mushrooms use disorder, severe ([place]) [F16.20] 04/21/2017  . Cannabis abuse, daily use [F12.10] 06/03/2016  . Situational anxiety [F41.8] 06/03/2016  . Asthma [J45.909] 05/26/2016  . Atopic eczema [L20.9] 05/26/2016  . Screening [Z13.9] 04/29/2015  . 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 Allergic rhinitis, seasonal [J30.2] 07/14/2014   History of Present Illness:   Identifying data. Ms. Stuart Harper is a 22 year old male with a history of bipolar disorder.  Chief complaint.  History of present illness. Information was obtained from the patient and the chart. The patient was brought to the ER by his outpatient psychiatrist, Dr. Einar Harper. He came to her office complaining of depression, severe anxiety and insomnia and suicidal ideation with a plan to stab  himself in the neck. He kept a knife on bedside table. He has experienced more depression and anxiety since December of 2018. Last week, he became anxious again in the context of Adderall and Xanax abuse. After he stopped, he started  experiencing symptoms of withdrawal. He made appointment with Dr. Einar Harper. He reports extremely poor sleep, decreased appetite with 5 lbs weight loss, anhedonia, feeling of hopelessness helplessness and guilt, poor energy and concentration, crying spells, social isolation, and heightened anxiety that culminated in suicidal thinking. There were no psychotic symptoms. He does not report history of manic episodes outside of Adderrall use. He reports social He smokes cannabis regularly. He has been abusing Xanax and Adderall for 6 months. He id not a drinker.  Past psychiatric history. The patient has a long history of depression and anxiety of OCD type since 2005 when the family relocated from [place] to the [name]. He did see a counselor then. In 2017 he had an episode of severe depression, anxiety, insomnia lasting about two weeks and was treated with Clonazepam and Lexapro. Less than a year ago he started seeing Dr. Einar Harper and was prescribe low dose seroquel. He stopped taking it as it made him somnolent. In December of 2018, he returned to the ER complaining again of depression, anxiety and insomnia. He was started on low dose Zyprexa and Trazodone. He stopped taking it when run out. Two days ago, he went to his PCP who restarted Seroquel 50 mg nightly. There were no prior hospitalizations or suicide attempts.  Family psychiatric history. Grandmother with bipolar and substance abuse.  Social history. Born in [place], raised in [place]. Came to the [name] in 2005. This caused severe anxiety. Graduated from [employer] with double major in [place] and international studies. 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 Works at [employer]. Lives with 2 housemates. Parents went to [place] as missionaries a month ago. They will be back in 4 years.  Total Time spent with patient: 1 hour  Is  the patient at risk to self? Yes.    Has the patient been a risk to self in the past 6 months? No.  Has the patient been a  risk to self within the distant past? No.  Is the patient a risk to others? No.  Has the patient been a risk to others in the past 6 months? No.  Has the patient been a risk to others within the distant past? No.   Prior Inpatient Therapy:   Prior Outpatient Therapy:    Alcohol Screening: 1. How often do you have a drink containing alcohol?: Never 2. How many drinks containing alcohol do you have on a typical day when you are drinking?: 1 or 2 3. How often do you have six or more drinks on one occasion?: Never AUDIT-C Score: 0 4. How often during the last year have you found that you were not able to stop drinking once you had started?: Never 5. How often during the last year have you failed to do what was normally expected from you becasue of drinking?: Never 6. How often during the last year have you needed a first drink in the morning to get yourself going after a heavy drinking session?: Never 7. How often during the last year have you had a feeling of guilt of remorse after drinking?: Never 8. How often during the last year have you been unable to remember what happened the night before because you had been drinking?: Never 9. Have you or someone else been injured as a result of your drinking?: No 10. Has a relative or friend or a doctor or another health worker been concerned about your drinking or suggested you cut down?: No Alcohol Use Disorder Identification Test Final Score (AUDIT): 0 Intervention/Follow-up: AUDIT Score <7 follow-up not indicated Substance Abuse History in the last 12 months:  Yes.   Consequences of Substance Abuse: NA Previous Psychotropic Medications: Yes  Psychological Evaluations: No  Past Medical History:  Past Medical History:  Diagnosis Date  . Allergy   . Anxiety   . Asthma   . Seasonal allergies     Past Surgical History:  Procedure Laterality Date  . COLONOSCOPY WITH PROPOFOL N/A 05/26/2017   Procedure: COLONOSCOPY WITH PROPOFOL;  Surgeon: [name], MD;  Location: [facility] ENDOSCOPY;  Service: Gastroenterology;  Laterality: N/A;  . ESOPHAGOGASTRODUODENOSCOPY (EGD) WITH PROPOFOL N/A 05/26/2017   Procedure: ESOPHAGOGASTRODUODENOSCOPY (EGD) WITH PROPOFOL;  Surgeon: [name], MD;  Location[facility]COPY;  Service: Gastroenterology;  Laterality: N/A;  . None     Family History:  Family History  Problem Relation Age of Onset  . Hypertension Father   . Allergies Father   . Osteoarthritis Father   . Depression Father   . Allergies Mother   . Asthma Mother   . Migraines Mother   . Asthma Paternal Uncle   . Lung cancer Maternal Grandfather   . Bipolar disorder Paternal Grandmother   . Alzheimer's disease Paternal Grandfather   . CAD Other    Tobacco Screening: Have you used any form of tobacco in the last 30 days? (Cigarettes, Smokeless Tobacco, Cigars, and/or Pipes): No Social History:  Social History   Substance and Sexual Activity  Alcohol Use No     Social History   Substance and Sexual Activity  Drug Use Yes  . Frequency: 14.0 times per week  . Types: Marijuana   Comment: used everyday    Additional Social History:  Allergies:   Allergies  Allergen Reactions  . Shellfish Allergy Rash   Lab Results:  Results for orders placed or performed during the hospital encounter of 10/19/17 (from the past 48 hour(s))  Lipid panel     Status: None   Collection Time: 10/20/17  7:38 AM  Result Value Ref Range   Cholesterol 126 0 - 200 mg/dL   Triglycerides 60 <150 mg/dL   HDL 41 >40 mg/dL   Total CHOL/HDL Ratio 3.1 RATIO   VLDL 12 0 - 40 mg/dL   LDL Cholesterol 73 0 - 99 mg/dL    Comment:        Total Cholesterol/HDL:CHD Risk Coronary Heart Disease Risk Table                     Men   Women  1/2 Average Risk   3.4   3.3  Average Risk       5.0   4.4  2 X Average Risk   9.6   7.1  3 X Average Risk  23.4   11.0        Use the calculated Patient Ratio above and the CHD Risk  Table to determine the patient's CHD Risk.        ATP III CLASSIFICATION (LDL):  <100     mg/dL   Optimal  100-129  mg/dL   Near or Above                    Optimal  130-159  mg/dL   Borderline  160-189  mg/dL   High  >190     mg/dL   Very High Performed at [facility], [address] [place], [place] [address]   TSH     Status: None   Collection Time: 10/20/17  7:38 AM  Result Value Ref Range   TSH 0.817 0.350 - 4.500 uIU/mL    Comment: Performed by a 3rd Generation assay with a functional sensitivity of <=0.01 uIU/mL. Performed at [facility], [address] [place], [place] [address]     Blood Alcohol level:  Lab Results  Component Value Date   [facility] <10 10/16/2017   ETH <10 35/36/1443    Metabolic Disorder Labs:  No results found for: HGBA1C, MPG No results found for: PROLACTIN Lab Results  Component Value Date   CHOL 126 10/20/2017   TRIG 60 10/20/2017   HDL 41 10/20/2017   CHOLHDL 3.1 10/20/2017   VLDL 12 10/20/2017   LDLCALC 73 10/20/2017    Current Medications: Current Facility-Administered Medications  Medication Dose Route Frequency Provider Last Rate Last Dose  . acetaminophen (TYLENOL) tablet 650 mg  650 mg Oral Q6H PRN Bradford Cazier B, MD      . alum & mag hydroxide-simeth (MAALOX/MYLANTA) 200-200-20 MG/5ML suspension 30 mL  30 mL Oral Q4H PRN Azaiah Licciardi B, MD      . ARIPiprazole (ABILIFY) tablet 5 mg  5 mg Oral Daily Misha Vanoverbeke B, MD      . chlordiazePOXIDE (LIBRIUM) capsule 25 mg  25 mg Oral QID Zenita Kister B, MD   25 mg at 10/20/17 0814  . [START ON 10/21/2017] fluvoxaMINE (LUVOX) tablet 100 mg  100 mg Oral QHS Brad Lieurance B, MD      . fluvoxaMINE (LUVOX) tablet 50 mg  50 mg Oral QHS Admir Candelas B, MD      . hydrOXYzine (ATARAX/VISTARIL) tablet 25 mg  25 mg Oral TID PRN  Orson Slick B, MD   25 mg at 10/20/17 0558  . magnesium hydroxide (MILK OF MAGNESIA) suspension 30 mL  30  mL Oral Daily PRN Aften Lipsey B, MD      . QUEtiapine (SEROQUEL) tablet 50 mg  50 mg Oral QHS Najeh Credit B, MD   50 mg at 10/19/17 2113   PTA Medications: Medications Prior to Admission  Medication Sig Dispense Refill Last Dose  . cetirizine (ZYRTEC) 5 MG tablet Take by mouth.   Taking  . FLUoxetine (PROZAC) 20 MG capsule Take 1 capsule (20 mg total) by mouth daily. 30 capsule 1 Taking  . fluticasone (FLONASE) 50 MCG/ACT nasal spray Place into the nose.   Taking  . QUEtiapine (SEROQUEL) 25 MG tablet Take 1 tablet (25 mg total) by mouth at bedtime. 30 tablet 0 Taking  . traZODone (DESYREL) 100 MG tablet Take 1 tablet (100 mg total) by mouth at bedtime as needed for sleep. 30 tablet 1 Taking  . valACYclovir (VALTREX) 1000 MG tablet TAKE 2 TABLETS BY MOUTH TWICE A DAY AS NEEDED   Taking    Musculoskeletal: Strength & Muscle Tone: within normal limits Gait & Station: normal Patient leans: N/A  Psychiatric Specialty Exam: Physical Exam  Nursing note and vitals reviewed. Constitutional: He is oriented to person, place, and time. He appears well-developed and well-nourished.  HENT:  Head: Normocephalic and atraumatic.  Eyes: Conjunctivae and EOM are normal. Pupils are equal, round, and reactive to light.  Neck: Normal range of motion. Neck supple.  Cardiovascular: Normal rate, regular rhythm and normal heart sounds.  Respiratory: Effort normal and breath sounds normal.  GI: Soft. Bowel sounds are normal.  Musculoskeletal: Normal range of motion.  Neurological: He is alert and oriented to person, place, and time.  Skin: Skin is warm and dry.  Psychiatric: His speech is normal. His mood appears anxious. He is withdrawn. Cognition and memory are normal. He expresses impulsivity. He exhibits a depressed mood. He expresses suicidal ideation. He expresses suicidal plans.    Review of Systems  Gastrointestinal: Positive for nausea.  Neurological: Negative.    Psychiatric/Behavioral: Positive for depression, substance abuse and suicidal ideas. The patient is nervous/anxious and has insomnia.   All other systems reviewed and are negative.   Blood pressure (!) 130/94, pulse 86, temperature 98.9 F (37.2 C), temperature source Oral, resp. rate 18, height 6' (1.829 m), weight 63.5 kg (140 lb), SpO2 100 %.Body mass index is 18.99 kg/m.  See SRA                                                  Sleep:  Number of Hours: 7.25    Treatment Plan Summary: Daily contact with patient to assess and evaluate symptoms and progress in treatment and Medication management    Mr. Saha is a 22 year old male with a history of bipolar disorder and severe anxiety admitted for suicidal ideation in the context of substance abuse.  #Suicidal ideatio-patient able to contract for safety in the hospital  #Mood/anxiety, patient axious, restless, crying -start Luvox 50 mg nightly, increase to 100 tomorrow -continue Seroquel 100 mg nightly plus 50 mg TID -Vistaril 25 mg PRN -start Depakote 500 mg TID  #Insomnia -Seroquel   #Benzodiazepine withdrawal -Librium taper -patient motivated for change -prefers autpatient setting  #Metabolic syndrome monitoring -Lipid panel, TSH and  HgbA1C are unremarkable -EKG, QTc 429  #Disposition -discharge to home -follow up with Dr. Einar Harper   Observation Level/Precautions:  15 minute checks  Laboratory:  CBC Chemistry Profile UDS UA  Psychotherapy:    Medications:    Consultations:    Discharge Concerns:    Estimated LOS:  Other:     Physician Treatment Plan for Primary Diagnosis: Bipolar I disorder, most recent episode depressed, severe without psychotic features ([place]) Long Term Goal(s): Improvement in symptoms so as ready for discharge  Short Term Goals: Ability to identify changes in lifestyle to reduce recurrence of condition will improve, Ability to verbalize feelings will improve, Ability  to disclose and discuss suicidal ideas, Ability to demonstrate self-control will improve, Ability to identify and develop effective coping behaviors will improve, Ability to maintain clinical measurements within normal limits will improve, Compliance with prescribed medications will improve and Ability to identify triggers associated with substance abuse/mental health issues will improve  Physician Treatment Plan for Secondary Diagnosis: Principal Problem:   Bipolar I disorder, most recent episode depressed, severe without psychotic features ([place]) Active Problems:   Cannabis use disorder, severe, dependence ([place])   Sedative, hypnotic or anxiolytic use disorder, severe, dependence ([place])   Benzodiazepine withdrawal ([place])   OCD (obsessive compulsive disorder)  Long Term Goal(s): Improvement in symptoms so as ready for discharge  Short Term Goals: Ability to demonstrate self-control will improve and Ability to identify triggers associated with substance abuse/mental health issues will improve  I certify that inpatient services furnished can reasonably be expected to improve the patient's condition.    Orson Slick, MD 2/1/20199:50 AM

## 2017-10-20 NOTE — Progress Notes (Signed)
Chaplain visited patient at 10:55 and began a conversation about his medical and family concerns. At 11:03, the Chaplain was paged to the ED to minister to a family whose loved one had died.  The consult has been referred to [facility].

## 2017-10-20 NOTE — BHH Suicide Risk Assessment (Signed)
[facility] Admission Suicide Risk Assessment   Nursing information obtained from:    Demographic factors:    Current Mental Status:    Loss Factors:    Historical Factors:    Risk Reduction Factors:     Total Time spent with patient: 1 hour Principal Problem: Bipolar I disorder, most recent episode depressed, severe without psychotic features ([place]) Diagnosis:   Patient Active Problem List   Diagnosis Date Noted  . Bipolar I disorder, most recent episode depressed, severe without psychotic features ([place]) [F31.4] 10/19/2017    Priority: High  . Sedative, hypnotic or anxiolytic use disorder, severe, dependence ([place]) [F13.20] 10/20/2017  . Benzodiazepine withdrawal ([place]) [F13.239] 10/20/2017  . OCD (obsessive compulsive disorder) [F42.9] 10/20/2017  . Severe recurrent major depression without psychotic features ([place]) [F33.2] 10/16/2017  . Panic disorder [F41.0] 10/16/2017  . Cannabis use disorder, severe, dependence ([place]) [F12.20] 04/21/2017  . Hallucinogenic mushrooms use disorder, severe ([place]) [F16.20] 04/21/2017  . Cannabis abuse, daily use [F12.10] 06/03/2016  . Situational anxiety [F41.8] 06/03/2016  . Asthma [J45.909] 05/26/2016  . Atopic eczema [L20.9] 05/26/2016  . Screening [Z13.9] 04/29/2015  . 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 Allergic rhinitis, seasonal [J30.2] 07/14/2014   Subjective Data: suicidal ideation  Continued Clinical Symptoms:  Alcohol Use Disorder Identification Test Final Score (AUDIT): 0 The "Alcohol Use Disorders Identification Test", Guidelines for Use in Primary Care, Second Edition.  World Pharmacologist [facility]). Score between 0-7:  no or low risk or alcohol related problems. Score between 8-15:  moderate risk of alcohol related problems. Score between 16-19:  high risk of alcohol related problems. Score 20 or above:  warrants further diagnostic evaluation for alcohol dependence and treatment.   CLINICAL FACTORS:   Severe Anxiety and/or Agitation Bipolar Disorder:   Mixed State Depression:   Comorbid alcohol abuse/dependence Impulsivity Insomnia Alcohol/Substance Abuse/Dependencies   Musculoskeletal: Strength & Muscle Tone: within normal limits Gait & Station: normal Patient leans: N/A  Psychiatric Specialty Exam: Physical Exam  Nursing note and vitals reviewed. Psychiatric: His speech is normal. His mood appears anxious. He is withdrawn. Cognition and memory are normal. He expresses impulsivity. He exhibits a depressed mood. He expresses suicidal ideation. He expresses suicidal plans.    Review of Systems  Neurological: Negative.   Psychiatric/Behavioral: Positive for depression and suicidal ideas. The patient is nervous/anxious and has insomnia.   All other systems reviewed and are negative.   Blood pressure (!) 130/94, pulse 86, temperature 98.9 F (37.2 C), temperature source Oral, resp. rate 18, height 6' (1.829 m), weight 63.5 kg (140 lb), SpO2 100 %.Body mass index is 18.99 kg/m.  General Appearance: Casual  Eye Contact:  Good  Speech:  Clear and Coherent  Volume:  Decreased  Mood:  Anxious, Depressed and Hopeless  Affect:  Blunt  Thought Process:  Goal Directed and Descriptions of Associations: Intact  Orientation:  Full (Time, Place, and Person)  Thought Content:  WDL  Suicidal Thoughts:  Yes.  with intent/plan  Homicidal Thoughts:  No  Memory:  Immediate;   Fair Recent;   Fair Remote;   Fair  Judgement:  Poor  Insight:  Lacking  Psychomotor Activity:  Psychomotor Retardation  Concentration:  Concentration: Fair and Attention Span: Fair  Recall:  [employer] of Knowledge:  Fair  Language:  Fair  Akathisia:  No  Handed:  Right  AIMS (if indicated):     Assets:  Communication Skills Desire for Improvement Financial Resources/Insurance [place]  Social Arts administrator  ADL's:  Intact  Cognition:  WNL  Sleep:   Number of Hours: 7.25      COGNITIVE FEATURES THAT CONTRIBUTE TO RISK:  None    SUICIDE RISK:   Severe:  Frequent, intense, and enduring suicidal ideation, specific plan, no subjective intent, but some objective markers of intent (i.e., choice of lethal method), the method is accessible, some limited preparatory behavior, evidence of impaired self-control, severe dysphoria/symptomatology, multiple risk factors present, and few if any protective factors, particularly a lack of social support.  PLAN OF CARE: hospital admission, medication management, substance abuse counseling, discharge planning.  Mr. Rippeon is a 22 year old male with a history of bipolar disorder and severe anxiety admitted for suicidal ideation in the context of substance abuse.  #Suicidal ideatio-patient able to contract for safety in the hospital  #Mood/anxiety -start Luvox 50 mg nightly -start Abilify 5 mag daily -Vistaril 25 mg PRN  #Insomnia -Seroquel 50 mg nightly  #Benzodiazepine withdrawal -Librium taper -patient motivated for change -prefers autpatient setting  #Metabolic syndrome monitoring -Lipid panel, TSH and HgbA1C are pending -EKG, pending  #Disposition -discharge to home -follow up with Dr. [name]  I certify that inpatient services furnished can reasonably be expected to improve the patient's condition.   Orson Slick, MD 10/20/2017, 9:50 AM

## 2017-10-21 NOTE — BHH Group Notes (Signed)
[facility] Group Notes:  (Nursing/MHT/Case Management/Adjunct)  Date:  10/21/2017  Time:  5:09 AM  Type of Therapy:  Psychoeducational Skills  Participation Level:  Active  Participation Quality:  Appropriate, Attentive and Sharing  Affect:  Appropriate  Cognitive:  Appropriate  Insight:  Appropriate and Good  Engagement in Group:  Engaged  Modes of Intervention:  Discussion, Socialization and Support  Summary of Progress/Problems:  Stuart Harper 10/21/2017, 5:09 AM

## 2017-10-21 NOTE — Plan of Care (Signed)
Patient is alert and oriented. Patient denies SI, HI and AVH. Patient complains of depression, but is compliant with treatment and medications. Patient attends group meetings and is active. Patient is appropriate with staff and peers on the unit. Safety checks will continue Q 15 minutes. Nurse will continue to monitor. Education: Knowledge of [place] Education information/materials will improve 10/21/2017 1133 - Progressing by Geraldo Docker, RN Emotional status will improve 10/21/2017 1133 - Progressing by Geraldo Docker, RN Mental status will improve 10/21/2017 1133 - Progressing by Geraldo Docker, RN Verbalization of understanding the information provided will improve 10/21/2017 1133 - Progressing by Geraldo Docker, RN   Education: Ability to state activities that reduce stress will improve 10/21/2017 1133 - Progressing by Geraldo Docker, RN

## 2017-10-21 NOTE — Progress Notes (Signed)
[facility] MD Progress Note  10/21/2017 6:02 PM Stuart Harper  MRN:  [id number] Subjective:  Stuart Harper is laying in bed.  He states he is "groggy, foggy and I want to rest as much as possible."  His mood is depressed.  Denies si/hi/avh Principal Problem: Bipolar I disorder, most recent episode depressed, severe without psychotic features ([place]) Diagnosis:   Patient Active Problem List   Diagnosis Date Noted  . Sedative, hypnotic or anxiolytic use disorder, severe, dependence ([place]) [F13.20] 10/20/2017  . Benzodiazepine withdrawal ([place]) [F13.239] 10/20/2017  . OCD (obsessive compulsive disorder) [F42.9] 10/20/2017  . Bipolar I disorder, most recent episode depressed, severe without psychotic features ([place]) [F31.4] 10/19/2017  . Severe recurrent major depression without psychotic features ([place]) [F33.2] 10/16/2017  . Panic disorder [F41.0] 10/16/2017  . Cannabis use disorder, severe, dependence ([place]) [F12.20] 04/21/2017  . Hallucinogenic mushrooms use disorder, severe ([place]) [F16.20] 04/21/2017  . Cannabis abuse, daily use [F12.10] 06/03/2016  . Situational anxiety [F41.8] 06/03/2016  . Asthma [J45.909] 05/26/2016  . Atopic eczema [L20.9] 05/26/2016  . Screening [Z13.9] 04/29/2015  . 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 Allergic rhinitis, seasonal [J30.2] 07/14/2014   Total Time spent with patient: 15 minutes  Past Psychiatric History: see h&p  Past Medical History:  Past Medical History:  Diagnosis Date  . Allergy   . Anxiety   . Asthma   . Seasonal allergies     Past Surgical History:  Procedure Laterality Date  . COLONOSCOPY WITH PROPOFOL N/A 05/26/2017   Procedure: COLONOSCOPY WITH PROPOFOL;  Surgeon: [name], MD;  [facility] ENDOSCOPY;  Service: Gastroenterology;  Laterality: N/A;  . ESOPHAGOGASTRODUODENOSCOPY (EGD) WITH PROPOFOL N/A 05/26/2017   Procedure: ESOPHAGOGASTRODUODENOSCOPY (EGD) WITH PROPOFOL;  Surgeon: [name], MD;   Location: [facility] ENDOSCOPY;  Service: Gastroenterology;  Laterality: N/A;  . None     Family History:  Family History  Problem Relation Age of Onset  . Hypertension Father   . Allergies Father   . Osteoarthritis Father   . Depression Father   . Allergies Mother   . Asthma Mother   . Migraines Mother   . Asthma Paternal Uncle   . Lung cancer Maternal Grandfather   . Bipolar disorder Paternal Grandmother   . Alzheimer's disease Paternal Grandfather   . CAD Other    Family Psychiatric  History: see h&p Social History:  Social History   Substance and Sexual Activity  Alcohol Use No     Social History   Substance and Sexual Activity  Drug Use Yes  . Frequency: 14.0 times per week  . Types: Marijuana   Comment: used everyday    Social History   Socioeconomic History  . Marital status: Single    Spouse name: None  . Number of children: 0  . Years of education: None  . Highest education level: Associate degree: occupational, Hotel manager, or vocational program  Social Needs  . Financial resource strain: Not hard at all  . Food insecurity - worry: Never true  . Food insecurity - inability: Never true  . Transportation needs - medical: No  . Transportation needs - non-medical: No  Occupational History  . None  Tobacco Use  . Smoking status: Never Smoker  . Smokeless tobacco: Never Used  Substance and Sexual Activity  . Alcohol use: No  . Drug use: Yes    Frequency: 14.0 times per week    Types: Marijuana    Comment: used everyday  . Sexual activity: Not  Currently  Other Topics Concern  . None  Social History Narrative   Lives with parents in 2 story home.     Student at [employer], sophomore. 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            Additional Social History:                         Sleep: Fair  Appetite:  Fair  Current Medications: Current Facility-Administered Medications  Medication  Dose Route Frequency Provider Last Rate Last Dose  . acetaminophen (TYLENOL) tablet 650 mg  650 mg Oral Q6H PRN Pucilowska, Jolanta B, MD      . alum & mag hydroxide-simeth (MAALOX/MYLANTA) 200-200-20 MG/5ML suspension 30 mL  30 mL Oral Q4H PRN Pucilowska, Jolanta B, MD      . chlordiazePOXIDE (LIBRIUM) capsule 25 mg  25 mg Oral QID Pucilowska, Jolanta B, MD   25 mg at 10/21/17 1741  . divalproex (DEPAKOTE) DR tablet 500 mg  500 mg Oral Q8H Pucilowska, Jolanta B, MD   500 mg at 10/21/17 1740  . fluvoxaMINE (LUVOX) tablet 100 mg  100 mg Oral QHS Pucilowska, Jolanta B, MD      . hydrOXYzine (ATARAX/VISTARIL) tablet 50 mg  50 mg Oral TID PRN Pucilowska, Jolanta B, MD      . magnesium hydroxide (MILK OF MAGNESIA) suspension 30 mL  30 mL Oral Daily PRN Pucilowska, Jolanta B, MD      . QUEtiapine (SEROQUEL) tablet 100 mg  100 mg Oral QHS Pucilowska, Jolanta B, MD   100 mg at 10/20/17 2103  . QUEtiapine (SEROQUEL) tablet 50 mg  50 mg Oral TID Pucilowska, Jolanta B, MD   50 mg at 10/21/17 1741    Lab Results:  Results for orders placed or performed during the hospital encounter of 10/19/17 (from the past 48 hour(s))  Hemoglobin A1c     Status: None   Collection Time: 10/20/17  7:38 AM  Result Value Ref Range   Hgb A1c MFr Bld 5.1 4.8 - 5.6 %    Comment: (NOTE) Pre diabetes:          5.7%-6.4% Diabetes:              >6.4% Glycemic control for   <7.0% adults with diabetes    Mean Plasma Glucose 99.67 mg/dL    Comment: Performed at [facility] [address], [place], [place] [address]  Lipid panel     Status: None   Collection Time: 10/20/17  7:38 AM  Result Value Ref Range   Cholesterol 126 0 - 200 mg/dL   Triglycerides 60 <150 mg/dL   HDL 41 >40 mg/dL   Total CHOL/HDL Ratio 3.1 RATIO   VLDL 12 0 - 40 mg/dL   LDL Cholesterol 73 0 - 99 mg/dL    Comment:        Total Cholesterol/HDL:CHD Risk Coronary Heart Disease Risk Table                     Men   Women  1/2 Average Risk   3.4    3.3  Average Risk       5.0   4.4  2 X Average Risk   9.6   7.1  3 X Average Risk  23.4   11.0        Use the calculated Patient  Ratio above and the CHD Risk Table to determine the patient's CHD Risk.        ATP III CLASSIFICATION (LDL):  <100     mg/dL   Optimal  100-129  mg/dL   Near or Above                    Optimal  130-159  mg/dL   Borderline  160-189  mg/dL   High  >190     mg/dL   Very High Performed at [facility], [address] [place], [place] [address]   TSH     Status: None   Collection Time: 10/20/17  7:38 AM  Result Value Ref Range   TSH 0.817 0.350 - 4.500 uIU/mL    Comment: Performed by a 3rd Generation assay with a functional sensitivity of <=0.01 uIU/mL. Performed at [facility], [address] [place], [place] [address]     Blood Alcohol level:  Lab Results  Component Value Date   [facility] <10 10/16/2017   ETH <10 23/30/0762    Metabolic Disorder Labs: Lab Results  Component Value Date   HGBA1C 5.1 10/20/2017   MPG 99.67 10/20/2017   No results found for: PROLACTIN Lab Results  Component Value Date   CHOL 126 10/20/2017   TRIG 60 10/20/2017   HDL 41 10/20/2017   CHOLHDL 3.1 10/20/2017   VLDL 12 10/20/2017   LDLCALC 73 10/20/2017    Physical Findings: AIMS: Facial and Oral Movements Muscles of Facial Expression: None, normal Lips and Perioral Area: None, normal Jaw: None, normal Tongue: None, normal,Extremity Movements Upper (arms, wrists, hands, fingers): None, normal Lower (legs, knees, ankles, toes): None, normal, Trunk Movements Neck, shoulders, hips: None, normal, Overall Severity Severity of abnormal movements (highest score from questions above): None, normal Incapacitation due to abnormal movements: None, normal Patient's awareness of abnormal movements (rate only patient's report): No Awareness, Dental Status Current problems with teeth and/or dentures?: No Does patient usually wear dentures?: No  CIWA:     COWS:     Musculoskeletal: Strength & Muscle Tone: within normal limits Gait & Station: normal Patient leans: N/A  Psychiatric Specialty Exam: Physical Exam  Constitutional: He appears well-developed and well-nourished.  Psychiatric: He is withdrawn.    Review of Systems  Respiratory: Negative.   Cardiovascular: Negative.   Gastrointestinal: Negative.   Psychiatric/Behavioral: Positive for depression.    Blood pressure 133/75, pulse 97, temperature 99 F (37.2 C), temperature source Oral, resp. rate 18, height 6' (1.829 m), weight 63.5 kg (140 lb), SpO2 100 %.Body mass index is 18.99 kg/m.  General Appearance: Casual  Eye Contact:  Good  Speech:  Clear and Coherent  Volume:  Decreased  Mood:  irritable  Affect:  Blunt  Thought Process:  Goal Directed and Descriptions of Associations: Intact  Orientation:  Full (Time, Place, and Person)  Thought Content:  WDL  Suicidal Thoughts:  Yes.  with intent/plan  Homicidal Thoughts:  No  Memory:  Immediate;   Fair Recent;   Fair Remote;   Fair  Judgement:  Poor  Insight:  Lacking  Psychomotor Activity:  Psychomotor Retardation  Concentration:  Concentration: Fair and Attention Span: Fair  Recall:  [employer] of Knowledge:  Fair  Language:  Fair  Akathisia:  No  Handed:  Right  AIMS (if indicated):     Assets:  Communication Skills Desire for Improvement Financial Resources/Insurance Housing Physical Health Resilience Social Support Transportation Vocational/Educational  ADL's:  Intact  Cognition:  WNL      Sleep:  Number of Hours: 7.25    Treatment Plan Summary: Daily contact with patient to assess and evaluate symptoms and progress in treatment and Medication management    Stuart Harper is a 22 year old male with a history of bipolar disorder and severe anxiety admitted for suicidal ideation in the context of substance abuse.  #Suicidal ideatio-patient able to contract for safety in the  hospital  #Mood/anxiety, patient axious, restless, crying - Luvox 100mg  starts tonight  -continue serroquel 100 mg nightly plus 50 mg TID -Vistaril 25 mg PRN -continue Depakote 500 mg TID  #Insomnia -Seroquel   #Benzodiazepine withdrawal -Librium taper -patient motivated for change -prefers autpatient setting  #Metabolic syndrome monitoring -Lipid panel, TSH and HgbA1C are unremarkable -EKG, QTc 429  #Disposition -discharge to home -follow up with Dr. [name]   Observation Level/Precautions:  15 minute checks  Laboratory:  CBC Chemistry Profile UDS UA  Psychotherapy:    Medications:    Consultations:    Discharge Concerns:    Estimated LOS:  Other:     Physician Treatment Plan for Primary Diagnosis: Bipolar I disorder, most recent episode depressed, severe without psychotic features ([place]) Long Term Goal(s): Improvement in symptoms so as ready for discharge  Short Term Goals: Ability to identify changes in lifestyle to reduce recurrence of condition will improve, Ability to verbalize feelings will improve, Ability to disclose and discuss suicidal ideas, Ability to demonstrate self-control will improve, Ability to identify and develop effective coping behaviors will improve, Ability to maintain clinical measurements within normal limits will improve, Compliance with prescribed medications will improve and Ability to identify triggers associated with substance abuse/mental health issues will improve  Physician Treatment Plan for Secondary Diagnosis: Principal Problem:   Bipolar I disorder, most recent episode depressed, severe without psychotic features ([place]) Active Problems:   Cannabis use disorder, severe, dependence (HCC)   Sedative, hypnotic or anxiolytic use disorder, severe, dependence ([place])   Benzodiazepine withdrawal ([place])   OCD (obsessive compulsive disorder)  Long Term Goal(s): Improvement in symptoms so as ready for discharge  Short Term Goals:  Ability to demonstrate self-control will improve and Ability to identify triggers associated with substance abuse/mental health issues will improve  I certify that inpatient services furnished can reasonably be expected to improve the patient's condition.         Jolene Schimke, MD 10/21/2017, 6:02 PM

## 2017-10-21 NOTE — BHH Group Notes (Signed)
LCSW Group Therapy Note  10/21/2017 1:15pm  Type of Therapy and Topic: Group Therapy: Holding on to Grudges   Participation Level: Minimal   Description of Group:  In this group patients will be asked to explore and define a grudge. Patients will be guided to discuss their thoughts, feelings, and reasons as to why people have grudges. Patients will process the impact grudges have on daily life and identify thoughts and feelings related to holding grudges. Facilitator will challenge patients to identify ways to let go of grudges and the benefits this provides. Patients will be confronted to address why one struggles letting go of grudges. Lastly, patients will identify feelings and thoughts related to what life would look like without grudges. This group will be process-oriented, with patients participating in exploration of their own experiences, giving and receiving support, and processing challenge from other group members.  Therapeutic Goals:  1. Patient will identify specific grudges related to their personal life.  2. Patient will identify feelings, thoughts, and beliefs around grudges.  3. Patient will identify how one releases grudges appropriately.  4. Patient will identify situations where they could have let go of the grudge, but instead chose to hold on.   Summary of Patient Progress:  Pt reported he feels "so so". He shared with the group that he does not hold any grudges with people, when someone does something to him he stops talking to them. He stated he wont waste time with people that he cannot trust.    Therapeutic Modalities:  Cognitive Behavioral Therapy  Solution Focused Therapy  Motivational Interviewing  Brief Therapy   Kaytlin Burklow  CUEBAS-COLON, LCSW 10/21/2017 12:54 PM

## 2017-10-21 NOTE — Progress Notes (Signed)
D:Pt denies SI/HI/AVH. Pt. Verbally contracts for safety. Pt. States he can remain safe while on the unit. Pt. Upon interaction reports anxiety 7/10, but denies the need for PRN medications. Pt. Presents appearing depressed, sad, and anxious. Pt. Despite appearing sad reports depression 0/10.   Pt. Reports feeling, "worse" today then he did yesterday when asked how he is doing. Pt. Reports, "I want to start feeling better". Pt. Is engaging, but minimal. Pt. Reports sleeping good, but eating not so well.   A: Q x 15 minute observation checks were completed for safety. Patient was provided with education.Pt. Verbalizes understanding of provided education. Patient was given scheduled medications. Patient  was encourage to attend groups, participate in unit activities and continue with plan of care.   R:Patient is complaint with medication and unit procedures. Pt. Attends groups.              Patient slept for Estimated Hours of 8; Precautionary checks every 15 minutes for safety maintained, room free of safety hazards, patient sustains no injury or falls during this shift.

## 2017-10-22 MED ORDER — QUETIAPINE FUMARATE 200 MG PO TABS
200.0000 mg | ORAL_TABLET | Freq: Every day | ORAL | Status: DC
  Administered 2017-10-22: 200 mg via ORAL
  Filled 2017-10-22: qty 1

## 2017-10-22 MED ORDER — DIVALPROEX SODIUM 500 MG PO DR TAB
750.0000 mg | DELAYED_RELEASE_TABLET | Freq: Two times a day (BID) | ORAL | Status: DC
  Administered 2017-10-22: 20:00:00 750 mg via ORAL
  Filled 2017-10-22: qty 1

## 2017-10-22 NOTE — Plan of Care (Signed)
  Education: Knowledge of [place] Education information/materials will improve 10/22/2017 1819 - Progressing by Rise Mu, RN 10/22/2017 1815 - Progressing by Rise Mu, RN Emotional status will improve 10/22/2017 1819 - Progressing by Rise Mu, RN Note Verbalizes that he feels much better today.  Affect improved slightly.   10/22/2017 1815 - Not Progressing by Rise Mu, RN Note Affect flat.  Sad, depressed and tearful Mental status will improve 10/22/2017 1819 - Progressing by Rise Mu, RN Note Pleasant and cooperative.  More visible in the milieu.   10/22/2017 1815 - Not Progressing by Rise Mu, RN Note Rates depression as 8/10.  Verbalizes that he is anxious and don't know what he can do.  Further states that he feels that if he could sleep he would feel better but unable to go to sleep.  States that he cannot concentrate and when he tries to sleep his mind just races.   Verbalization of understanding the information provided will improve 10/22/2017 1819 - Progressing by Rise Mu, RN 10/22/2017 1815 - Progressing by Rise Mu, RN   Education: Ability to state activities that reduce stress will improve 10/22/2017 1819 - Progressing by Rise Mu, RN 10/22/2017 1815 - Progressing by Rise Mu, RN Note Verbalizes that nothing is working.     Coping: Ability to identify and develop effective coping behavior will improve 10/22/2017 1815 - Not Progressing by Rise Mu, RN Note States that he just does not know what to do.  He is just tired and cannot relax and fall asleep.     Self-Concept: Ability to identify factors that promote anxiety will improve 10/22/2017 1815 - Not Progressing by Rise Mu, RN Note Unable to pinpoint any one thing that is contributing to his anxiety Level of anxiety will decrease 10/22/2017 1815 - Not Progressing by Rise Mu, RN Note Anxiety remains the same Ability to modify  response to factors that promote anxiety will improve 10/22/2017 1819 - Progressing by Rise Mu, RN 10/22/2017 1815 - Not Progressing by Rise Mu, RN   Pain Managment: General experience of comfort will improve 10/22/2017 1819 - Progressing by Rise Mu, RN 10/22/2017 1815 - Not Progressing by Rise Mu, RN

## 2017-10-22 NOTE — Plan of Care (Signed)
Pt. Reports feeling, "better" today then he did yesterday. Pt. Elaborating on how he is doing states, "I'm good today, but kind of numb as well..it's hard to explain". Pt. Verbalizes understanding of provided education. Pt. Denies pain this evening. Pt. Denies SI/HI. Pt. Verbally contracts for safety. Pt. States he can remain safe while on the unit.  Progressing Education: Knowledge of [place] Education information/materials will improve 10/22/2017 2129 - Progressing by Reyes Ivan, RN Emotional status will improve 10/22/2017 2129 - Progressing by Reyes Ivan, RN Mental status will improve 10/22/2017 2129 - Progressing by Reyes Ivan, RN Verbalization of understanding the information provided will improve 10/22/2017 2129 - Progressing by Reyes Ivan, RN Pain Managment: General experience of comfort will improve 10/22/2017 2129 - Progressing by Reyes Ivan, RN

## 2017-10-22 NOTE — [facility] Group Notes (Signed)
LCSW Group Therapy Note 10/22/2017 1:15pm  Type of Therapy and Topic: Group Therapy: Feelings Around Returning Home & Establishing a Supportive Framework and Supporting Oneself When Supports Not Available  Participation Level: Active  Description of Group:  Patients first processed thoughts and feelings about upcoming discharge. These included fears of upcoming changes, lack of change, new living environments, judgements and expectations from others and overall stigma of mental health issues. The group then discussed the definition of a supportive framework, what that looks and feels like, and how do to discern it from an unhealthy non-supportive network. The group identified different types of supports as well as what to do when your family/friends are less than helpful or unavailable  Therapeutic Goals  1. Patient will identify one healthy supportive network that they can use at discharge. 2. Patient will identify one factor of a supportive framework and how to tell it from an unhealthy network. 3. Patient able to identify one coping skill to use when they do not have positive supports from others. 4. Patient will demonstrate ability to communicate their needs through discussion and/or role plays.  Summary of Patient Progress:  Pt engaged during group session. As patients processed their anxiety about discharge and described healthy supports patient shared he feels better everyday. He stated he understand that there are certain things he needs to work on and that he is going to trust the doctor tell him when he is ready to leave the hospital.  Patients identified at least one self-care tool they were willing to use after discharge.   Therapeutic Modalities Cognitive Behavioral Therapy Motivational Interviewing   Stuart Harper  CUEBAS-COLON, LCSW 10/22/2017 12:06 PM

## 2017-10-22 NOTE — Progress Notes (Signed)
D:Pt denies SI/HI/AVH. Pt. Verbally contracts for safety. Pt. States he can remain safe while on the unit. Pt. Reports feeling, "better" today then he did yesterday. Pt. Elaborating on how he is doing states, "I'm good today, but kind of numb as well..it's hard to explain". Pt. When asked about anxiety and depression states his anxiety is at a, "5/10" and states, "Iv'e never been able to feel like how I'm feeling is depression". Pt. Upon interaction appears flat and sad with depression, despite statements made. Pt. More active in the milieu and with peers this evening.  Pt. Reports eating and sleeping, "good".  A: Q x 15 minute observation checks were completed for safety. Patient was provided with education. Pt. Verbalizes understanding of provided education.Patient was given scheduled medications. Patient  was encourage to attend groups, participate in unit activities and continue with plan of care.   R:Patient is complaint with medication and unit procedures. Pt. Attends groups.             Patient slept for Estimated Hours of 7; Precautionary checks every 15 minutes for safety maintained, room free of safety hazards, patient sustains no injury or falls during this shift.

## 2017-10-22 NOTE — [facility] Group Notes (Signed)
[facility] Group Notes:  (Nursing/MHT/Case Management/Adjunct)  Date:  10/22/2017  Time:  12:00 AM  Type of Therapy:  Group Therapy  Participation Level:  Active    Participation Quality:  Appropriate  Affect:  Appropriate  Cognitive:  Alert  Insight:  Appropriate  Engagement in Group:  Engaged  Modes of Intervention:  Support  Summary of Progress/Problems:  Stuart Harper 10/22/2017, 12:00 AM

## 2017-10-22 NOTE — Progress Notes (Signed)
[facility] MD Progress Note  10/22/2017 6:42 PM Stuart Harper  MRN:  [id number] Subjective:  Stuart Harper is laying in bed.  His mood continues to be depressed.  He just wants to sleep.  Denies si/hi/avh  Principal Problem: Bipolar I disorder, most recent episode depressed, severe without psychotic features ([place]) Diagnosis:   Patient Active Problem List   Diagnosis Date Noted  . Sedative, hypnotic or anxiolytic use disorder, severe, dependence ([place]) [F13.20] 10/20/2017  . Benzodiazepine withdrawal ([place]) [F13.239] 10/20/2017  . OCD (obsessive compulsive disorder) [F42.9] 10/20/2017  . Bipolar I disorder, most recent episode depressed, severe without psychotic features ([place]) [F31.4] 10/19/2017  . Severe recurrent major depression without psychotic features ([place]) [F33.2] 10/16/2017  . Panic disorder [F41.0] 10/16/2017  . Cannabis use disorder, severe, dependence ([place]) [F12.20] 04/21/2017  . Hallucinogenic mushrooms use disorder, severe ([place]) [F16.20] 04/21/2017  . Cannabis abuse, daily use [F12.10] 06/03/2016  . Situational anxiety [F41.8] 06/03/2016  . Asthma [J45.909] 05/26/2016  . Atopic eczema [L20.9] 05/26/2016  . Screening [Z13.9] 04/29/2015  . 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 Allergic rhinitis, seasonal [J30.2] 07/14/2014   Total Time spent with patient: 15 minutes  Past Psychiatric History: see h&p  Past Medical History:  Past Medical History:  Diagnosis Date  . Allergy   . Anxiety   . Asthma   . Seasonal allergies     Past Surgical History:  Procedure Laterality Date  . COLONOSCOPY WITH PROPOFOL N/A 05/26/2017   Procedure: COLONOSCOPY WITH PROPOFOL;  Surgeon: [name], MD;  Location: [facility] ENDOSCOPY;  Service: Gastroenterology;  Laterality: N/A;  . ESOPHAGOGASTRODUODENOSCOPY (EGD) WITH PROPOFOL N/A 05/26/2017   Procedure: ESOPHAGOGASTRODUODENOSCOPY (EGD) WITH PROPOFOL;  Surgeon: [name], MD;  Location: [facility] ENDOSCOPY;  Service:  Gastroenterology;  Laterality: N/A;  . None     Family History:  Family History  Problem Relation Age of Onset  . Hypertension Father   . Allergies Father   . Osteoarthritis Father   . Depression Father   . Allergies Mother   . Asthma Mother   . Migraines Mother   . Asthma Paternal Uncle   . Lung cancer Maternal Grandfather   . Bipolar disorder Paternal Grandmother   . Alzheimer's disease Paternal Grandfather   . CAD Other    Family Psychiatric  History: see h&p Social History:  Social History   Substance and Sexual Activity  Alcohol Use No     Social History   Substance and Sexual Activity  Drug Use Yes  . Frequency: 14.0 times per week  . Types: Marijuana   Comment: used everyday    Social History   Socioeconomic History  . Marital status: Single    Spouse name: None  . Number of children: 0  . Years of education: None  . Highest education level: Associate degree: occupational, Hotel manager, or vocational program  Social Needs  . Financial resource strain: Not hard at all  . Food insecurity - worry: Never true  . Food insecurity - inability: Never true  . Transportation needs - medical: No  . Transportation needs - non-medical: No  Occupational History  . None  Tobacco Use  . Smoking status: Never Smoker  . Smokeless tobacco: Never Used  Substance and Sexual Activity  . Alcohol use: No  . Drug use: Yes    Frequency: 14.0 times per week    Types: Marijuana    Comment: used everyday  . Sexual activity: Not Currently  Other Topics Concern  .  None  Social History Narrative   Lives with parents in 2 story home.     Student at [employer], sophomore. 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            Additional Social History:                         Sleep: Fair  Appetite:  Fair  Current Medications: Current Facility-Administered Medications  Medication Dose Route Frequency Provider Last  Rate Last Dose  . acetaminophen (TYLENOL) tablet 650 mg  650 mg Oral Q6H PRN Pucilowska, Jolanta B, MD      . alum & mag hydroxide-simeth (MAALOX/MYLANTA) 200-200-20 MG/5ML suspension 30 mL  30 mL Oral Q4H PRN Pucilowska, Jolanta B, MD      . divalproex (DEPAKOTE) DR tablet 750 mg  750 mg Oral Q12H Cassidy Tabet, Jacquelynn Cree, MD      . fluvoxaMINE (LUVOX) tablet 100 mg  100 mg Oral QHS Pucilowska, Jolanta B, MD   100 mg at 10/21/17 2104  . hydrOXYzine (ATARAX/VISTARIL) tablet 50 mg  50 mg Oral TID PRN Pucilowska, Jolanta B, MD      . magnesium hydroxide (MILK OF MAGNESIA) suspension 30 mL  30 mL Oral Daily PRN Pucilowska, Jolanta B, MD      . QUEtiapine (SEROQUEL) tablet 200 mg  200 mg Oral QHS Jolene Schimke, MD        Lab Results:  No results found for this or any previous visit (from the past 48 hour(s)).  Blood Alcohol level:  Lab Results  Component Value Date   ETH <10 10/16/2017   ETH <10 62/69/4854    Metabolic Disorder Labs: Lab Results  Component Value Date   HGBA1C 5.1 10/20/2017   MPG 99.67 10/20/2017   No results found for: PROLACTIN Lab Results  Component Value Date   CHOL 126 10/20/2017   TRIG 60 10/20/2017   HDL 41 10/20/2017   CHOLHDL 3.1 10/20/2017   VLDL 12 10/20/2017   LDLCALC 73 10/20/2017    Physical Findings: AIMS: Facial and Oral Movements Muscles of Facial Expression: None, normal Lips and Perioral Area: None, normal Jaw: None, normal Tongue: None, normal,Extremity Movements Upper (arms, wrists, hands, fingers): None, normal Lower (legs, knees, ankles, toes): None, normal, Trunk Movements Neck, shoulders, hips: None, normal, Overall Severity Severity of abnormal movements (highest score from questions above): None, normal Incapacitation due to abnormal movements: None, normal Patient's awareness of abnormal movements (rate only patient's report): No Awareness, Dental Status Current problems with teeth and/or dentures?: No Does patient usually wear  dentures?: No  CIWA:    COWS:     Musculoskeletal: Strength & Muscle Tone: within normal limits Gait & Station: normal Patient leans: N/A  Psychiatric Specialty Exam: Physical Exam  Constitutional: He appears well-developed and well-nourished.  Psychiatric: He is withdrawn.    Review of Systems  Respiratory: Negative.   Cardiovascular: Negative.   Gastrointestinal: Negative.   Psychiatric/Behavioral: Positive for depression.    Blood pressure 128/88, pulse 96, temperature 97.9 F (36.6 C), temperature source Oral, resp. rate 18, height 6' (1.829 m), weight 63.5 kg (140 lb), SpO2 100 %.Body mass index is 18.99 kg/m.  General Appearance: Casual  Eye Contact:  Good  Speech:  Clear and Coherent  Volume:  Decreased  Mood:  irritable  Affect:  Blunt  Thought Process:  Goal Directed and Descriptions of Associations: Intact  Orientation:  Full (Time, Place, and Person)  Thought Content:  WDL  Suicidal Thoughts:  Yes.  with intent/plan  Homicidal Thoughts:  No  Memory:  Immediate;   Fair Recent;   Fair Remote;   Fair  Judgement:  Poor  Insight:  Lacking  Psychomotor Activity:  Psychomotor Retardation  Concentration:  Concentration: Fair and Attention Span: Fair  Recall:  [employer] of Knowledge:  Fair  Language:  Fair  Akathisia:  No  Handed:  Right  AIMS (if indicated):     Assets:  Communication Skills Desire for Improvement Financial Resources/Insurance Housing Physical Health Resilience Social Support Transportation Vocational/Educational  ADL's:  Intact  Cognition:  WNL      Sleep:  Number of Hours: 8    Treatment Plan Summary: Daily contact with patient to assess and evaluate symptoms and progress in treatment and Medication management    Stuart Harper is a 22 year old male with a history of bipolar disorder and severe anxiety admitted for suicidal ideation in the context of substance abuse.  #Suicidal ideatio-patient able to contract for safety in  the hospital  #Mood/anxiety, patient axious, restless, crying - Luvox 100mg  qday -I was concerned about pt being too sleepy so changed regimen from   seroquel 100 mg nightly plus 50 mg TID to seroquel 200mg  at night only  -Vistaril 25 mg PRN -changed from Depakote 500 mg TID to 750mg  bid   #Insomnia -Seroquel   #Benzodiazepine withdrawal -Librium taper -patient motivated for change -prefers autpatient setting  #Metabolic syndrome monitoring -Lipid panel, TSH and HgbA1C are unremarkable -EKG, QTc 429  #Disposition -discharge to home -follow up with Dr. Einar Harper   Observation Level/Precautions:  15 minute checks  Laboratory:  CBC Chemistry Profile UDS UA  Psychotherapy:    Medications:    Consultations:    Discharge Concerns:    Estimated LOS:  Other:     Physician Treatment Plan for Primary Diagnosis: Bipolar I disorder, most recent episode depressed, severe without psychotic features ([place]) Long Term Goal(s): Improvement in symptoms so as ready for discharge  Short Term Goals: Ability to identify changes in lifestyle to reduce recurrence of condition will improve, Ability to verbalize feelings will improve, Ability to disclose and discuss suicidal ideas, Ability to demonstrate self-control will improve, Ability to identify and develop effective coping behaviors will improve, Ability to maintain clinical measurements within normal limits will improve, Compliance with prescribed medications will improve and Ability to identify triggers associated with substance abuse/mental health issues will improve  Physician Treatment Plan for Secondary Diagnosis: Principal Problem:   Bipolar I disorder, most recent episode depressed, severe without psychotic features ([place]) Active Problems:   Cannabis use disorder, severe, dependence ([facility])   Sedative, hypnotic or anxiolytic use disorder, severe, dependence ([place])   Benzodiazepine withdrawal ([place])   OCD (obsessive compulsive  disorder)  Long Term Goal(s): Improvement in symptoms so as ready for discharge  Short Term Goals: Ability to demonstrate self-control will improve and Ability to identify triggers associated with substance abuse/mental health issues will improve  I certify that inpatient services furnished can reasonably be expected to improve the patient's condition.         Jolene Schimke, MD 10/22/2017, 6:42 PM

## 2017-10-22 NOTE — Plan of Care (Signed)
  Education: Knowledge of [place] Education information/materials will improve 10/22/2017 1815 - Progressing by Rise Mu, RN Emotional status will improve 10/22/2017 1815 - Not Progressing by Rise Mu, RN Note Affect flat.  Sad, depressed and tearful Mental status will improve 10/22/2017 1815 - Not Progressing by Rise Mu, RN Note Rates depression as 8/10.  Verbalizes that he is anxious and don't know what he can do.  Further states that he feels that if he could sleep he would feel better but unable to go to sleep.  States that he cannot concentrate and when he tries to sleep his mind just races.   Verbalization of understanding the information provided will improve 10/22/2017 1815 - Progressing by Rise Mu, RN   Education: Ability to state activities that reduce stress will improve 10/22/2017 1815 - Progressing by Rise Mu, RN Note Verbalizes that nothing is working.     Coping: Ability to identify and develop effective coping behavior will improve 10/22/2017 1815 - Not Progressing by Rise Mu, RN Note States that he just does not know what to do.  He is just tired and cannot relax and fall asleep.     Self-Concept: Ability to identify factors that promote anxiety will improve 10/22/2017 1815 - Not Progressing by Rise Mu, RN Note Unable to pinpoint any one thing that is contributing to his anxiety Level of anxiety will decrease 10/22/2017 1815 - Not Progressing by Rise Mu, RN Note Anxiety remains the same Ability to modify response to factors that promote anxiety will improve 10/22/2017 1815 - Not Progressing by Rise Mu, RN   Pain Managment: General experience of comfort will improve 10/22/2017 1815 - Not Progressing by Rise Mu, RN

## 2017-10-22 NOTE — Progress Notes (Signed)
Patient is cooperative with treatment has flat affect. Patient denies SI, HI and AVH. Patient still endorses depression, but he is compliant with treatment and medications. Patient had minimal interaction with peers and staff on unit. Safety checks will continue Q 15 minutes. Nurse will continue to monitor.

## 2017-10-23 DIAGNOSIS — F314 Bipolar disorder, current episode depressed, severe, without psychotic features: Principal | ICD-10-CM

## 2017-10-23 LAB — AMMONIA: Ammonia: 39 umol/L — ABNORMAL HIGH (ref 9–35)

## 2017-10-23 LAB — VALPROIC ACID LEVEL: VALPROIC ACID LVL: 102 ug/mL — AB (ref 50.0–100.0)

## 2017-10-23 MED ORDER — QUETIAPINE FUMARATE 100 MG PO TABS
100.0000 mg | ORAL_TABLET | Freq: Every day | ORAL | Status: DC
  Administered 2017-10-23: 100 mg via ORAL
  Filled 2017-10-23: qty 1

## 2017-10-23 MED ORDER — DIVALPROEX SODIUM 500 MG PO DR TAB
500.0000 mg | DELAYED_RELEASE_TABLET | Freq: Two times a day (BID) | ORAL | Status: DC
  Administered 2017-10-23 – 2017-10-24 (×3): 500 mg via ORAL
  Filled 2017-10-23 (×3): qty 1

## 2017-10-23 MED ORDER — DIVALPROEX SODIUM 500 MG PO DR TAB: 500.0000 mg | DELAYED_RELEASE_TABLET | Freq: Two times a day (BID) | ORAL | Status: DC

## 2017-10-23 NOTE — Progress Notes (Addendum)
Recreation Therapy Notes  Date: 02.04.2019  Time: 1:00PM  Location: Craft Room  Behavioral response: Appropriate   Intervention Topic: Goals  Discussion/Intervention: Group content on today was focused on goals. Patients described what goals are and how they define goals. Individuals expressed how they go about setting goals and reaching them. The group identified how important goals are and if they make short term goals to reach long term goals. Patients described how many goals they work on at a time and what affects them not reaching their goal. Individuals described how much time they put into planning and obtaining their goals. The group participated in the intervention "My Goal Board" and made personal goal boards to help them achieve their goal. Clinical Observations/Feedback:   Patient came to group and stated goals are an outcome of work. He explained that his goals is to cut back on bad habits like smoking and eventually stop. Individual explained that when he completes a goal he feels rewarded and accomplished.Patient explain when no goals are made people are being unproductive. He was social with peers and staff while completing the intervention during group.   Kerie Badger LRT/CTRS         Aly Hauser 10/23/2017 1:59 PM

## 2017-10-23 NOTE — Progress Notes (Signed)
Patient alert and oriented x 4. Up ad lib with a steady gait. Affect remains flat and sad. Observed lying in bed,resting with eyes closed for the majority of the morning. Compliant with meals and medications. Depakote level elevated this morning, dose decreased from 750 mg to 500 mg. Patient was educated on this decrease. Milieu remains safe.

## 2017-10-23 NOTE — Telephone Encounter (Signed)
Left message to call back  

## 2017-10-23 NOTE — [facility] Group Notes (Signed)
10/23/2017  Time: 0930   Type of Therapy and Topic:  Group Therapy:  Overcoming Obstacles   Participation Level:  Active   Description of Group:   In this group patients will be encouraged to explore what they see as obstacles to their own wellness and recovery. They will be guided to discuss their thoughts, feelings, and behaviors related to these obstacles. The group will process together ways to cope with barriers, with attention given to specific choices patients can make. Each patient will be challenged to identify changes they are motivated to make in order to overcome their obstacles. This group will be process-oriented, with patients participating in exploration of their own experiences, giving and receiving support, and processing challenge from other group members.   Therapeutic Goals: 1. Patient will identify personal and current obstacles as they relate to admission. 2. Patient will identify barriers that currently interfere with their wellness or overcoming obstacles.  3. Patient will identify feelings, thought process and behaviors related to these barriers. 4. Patient will identify two changes they are willing to make to overcome these obstacles:      Summary of Patient Progress Pt continues to work towards their tx goals but has not yet reached them. Pt was able to appropriately participate in group discussion, and was able to offer support/validation to other group members. Pt reported his obstacle is, "bad habits and unhealthy habits." Pt reported one step he can take to overcome this obstacle is, "talking to my friends. Hopefully, if I start to go back to those ways, my friends will remind me how bad I got when I was doing them." Pt added, "it's just hard after college because I don't have anyone telling me what to do or setting goals for me anymore. I'm in charge of my own life and that feels overwhelming at times." Pt reported feeling nervous about overcoming his obstacles upon  discharge.    Therapeutic Modalities:   Cognitive Behavioral Therapy Solution Focused Therapy Motivational Interviewing Relapse Prevention Therapy  Alden Hipp, MSW, LCSW 10/23/2017 10:32 AM

## 2017-10-23 NOTE — Progress Notes (Signed)
[facility] MD Progress Note  10/23/2017 5:51 AM Hassell Done Eliav Mechling  MRN:  [id number]  Subjective:    Mr. Carriere feels slightly better but his anxiety is 6/10, his affect completely flat, and he hides in his room. He has not been participating in programming. He reports feeling groggy from Seroquel but eid sleep 8 hours last night.  Treatment plan. We will lower Depakote to 500 mg BID, VPA level 102, and Seroquel to 100 mg mg nightly. We will continue Luvox 100 mg nightly. Librium taper completed.  Social/disposition. Discharge to home with roommates. Follow up with Dr. [name].  Principal Problem: Bipolar I disorder, most recent episode depressed, severe without psychotic features ([place]) Diagnosis:   Patient Active Problem List   Diagnosis Date Noted  . Bipolar I disorder, most recent episode depressed, severe without psychotic features ([place]) [F31.4] 10/19/2017    Priority: High  . Sedative, hypnotic or anxiolytic use disorder, severe, dependence ([place]) [F13.20] 10/20/2017  . Benzodiazepine withdrawal ([place]) [F13.239] 10/20/2017  . OCD (obsessive compulsive disorder) [F42.9] 10/20/2017  . Severe recurrent major depression without psychotic features ([place]) [F33.2] 10/16/2017  . Panic disorder [F41.0] 10/16/2017  . Cannabis use disorder, severe, dependence ([place]) [F12.20] 04/21/2017  . Hallucinogenic mushrooms use disorder, severe ([place]) [F16.20] 04/21/2017  . Cannabis abuse, daily use [F12.10] 06/03/2016  . Situational anxiety [F41.8] 06/03/2016  . Asthma [J45.909] 05/26/2016  . Atopic eczema [L20.9] 05/26/2016  . Screening [Z13.9] 04/29/2015  . 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 Allergic rhinitis, seasonal [J30.2] 07/14/2014   Total Time spent with patient: 30 minutes  Past Psychiatric History: depression, mood instability  Past Medical History:  Past Medical History:  Diagnosis Date  . Allergy   . Anxiety   . Asthma   . Seasonal allergies      Past Surgical History:  Procedure Laterality Date  . COLONOSCOPY WITH PROPOFOL N/A 05/26/2017   Procedure: COLONOSCOPY WITH PROPOFOL;  Surgeon: [name], MD;  Location: [facility] ENDOSCOPY;  Service: Gastroenterology;  Laterality: N/A;  . ESOPHAGOGASTRODUODENOSCOPY (EGD) WITH PROPOFOL N/A 05/26/2017   Procedure: ESOPHAGOGASTRODUODENOSCOPY (EGD) WITH PROPOFOL;  Surgeon: [name], MD;  Location: [facility] ENDOSCOPY;  Service: Gastroenterology;  Laterality: N/A;  . None     Family History:  Family History  Problem Relation Age of Onset  . Hypertension Father   . Allergies Father   . Osteoarthritis Father   . Depression Father   . Allergies Mother   . Asthma Mother   . Migraines Mother   . Asthma Paternal Uncle   . Lung cancer Maternal Grandfather   . Bipolar disorder Paternal Grandmother   . Alzheimer's disease Paternal Grandfather   . CAD Other    Family Psychiatric  History: bipolar disorder Social History:  Social History   Substance and Sexual Activity  Alcohol Use No     Social History   Substance and Sexual Activity  Drug Use Yes  . Frequency: 14.0 times per week  . Types: Marijuana   Comment: used everyday    Social History   Socioeconomic History  . Marital status: Single    Spouse name: None  . Number of children: 0  . Years of education: None  . Highest education level: Associate degree: occupational, Hotel manager, or vocational program  Social Needs  . Financial resource strain: Not hard at all  . Food insecurity - worry: Never true  . Food insecurity - inability: Never true  . Transportation needs - medical: No  . Transportation  needs - non-medical: No  Occupational History  . None  Tobacco Use  . Smoking status: Never Smoker  . Smokeless tobacco: Never Used  Substance and Sexual Activity  . Alcohol use: No  . Drug use: Yes    Frequency: 14.0 times per week    Types: Marijuana    Comment: used everyday  . Sexual activity: Not Currently  Other Topics  Concern  . None  Social History Narrative   Lives with parents in 2 story home.     Student at [employer], sophomore. 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            Additional Social History:                         Sleep: Fair  Appetite:  Fair  Current Medications: Current Facility-Administered Medications  Medication Dose Route Frequency Provider Last Rate Last Dose  . acetaminophen (TYLENOL) tablet 650 mg  650 mg Oral Q6H PRN Qiara Minetti B, MD      . alum & mag hydroxide-simeth (MAALOX/MYLANTA) 200-200-20 MG/5ML suspension 30 mL  30 mL Oral Q4H PRN Nena Hampe B, MD      . divalproex (DEPAKOTE) DR tablet 750 mg  750 mg Oral Q12H Jolene Schimke, MD   750 mg at 10/22/17 2003  . fluvoxaMINE (LUVOX) tablet 100 mg  100 mg Oral QHS Bolton Canupp B, MD   100 mg at 10/22/17 2106  . hydrOXYzine (ATARAX/VISTARIL) tablet 50 mg  50 mg Oral TID PRN Jacarie Pate B, MD      . magnesium hydroxide (MILK OF MAGNESIA) suspension 30 mL  30 mL Oral Daily PRN Vickey Boak B, MD      . QUEtiapine (SEROQUEL) tablet 200 mg  200 mg Oral QHS Jolene Schimke, MD   200 mg at 10/22/17 2106    Lab Results: No results found for this or any previous visit (from the past 75 hour(s)).  Blood Alcohol level:  Lab Results  Component Value Date   ETH <10 10/16/2017   ETH <10 46/27/0350    Metabolic Disorder Labs: Lab Results  Component Value Date   HGBA1C 5.1 10/20/2017   MPG 99.67 10/20/2017   No results found for: PROLACTIN Lab Results  Component Value Date   CHOL 126 10/20/2017   TRIG 60 10/20/2017   HDL 41 10/20/2017   CHOLHDL 3.1 10/20/2017   VLDL 12 10/20/2017   LDLCALC 73 10/20/2017    Physical Findings: AIMS: Facial and Oral Movements Muscles of Facial Expression: None, normal Lips and Perioral Area: None, normal Jaw: None, normal Tongue: None, normal,Extremity Movements Upper (arms,  wrists, hands, fingers): None, normal Lower (legs, knees, ankles, toes): None, normal, Trunk Movements Neck, shoulders, hips: None, normal, Overall Severity Severity of abnormal movements (highest score from questions above): None, normal Incapacitation due to abnormal movements: None, normal Patient's awareness of abnormal movements (rate only patient's report): No Awareness, Dental Status Current problems with teeth and/or dentures?: No Does patient usually wear dentures?: No  CIWA:    COWS:     Musculoskeletal: Strength & Muscle Tone: within normal limits Gait & Station: normal Patient leans: N/A  Psychiatric Specialty Exam: Physical Exam  Nursing note and vitals reviewed. Psychiatric: His speech is normal. Judgment and thought content normal. His mood appears anxious. His affect is blunt. He is withdrawn. Cognition and memory are normal. He  exhibits a depressed mood.    Review of Systems  Neurological: Negative.   Psychiatric/Behavioral: Positive for depression and substance abuse. The patient is nervous/anxious.   All other systems reviewed and are negative.   Blood pressure 128/88, pulse 96, temperature 97.9 F (36.6 C), temperature source Oral, resp. rate 18, height 6' (1.829 m), weight 63.5 kg (140 lb), SpO2 100 %.Body mass index is 18.99 kg/m.  General Appearance: Casual  Eye Contact:  Good  Speech:  Clear and Coherent  Volume:  Decreased  Mood:  Anxious, Depressed and Hopeless  Affect:  Blunt  Thought Process:  Goal Directed and Descriptions of Associations: Intact  Orientation:  Full (Time, Place, and Person)  Thought Content:  WDL  Suicidal Thoughts:  No  Homicidal Thoughts:  No  Memory:  Immediate;   Fair Recent;   Fair Remote;   Fair  Judgement:  Impaired  Insight:  Shallow  Psychomotor Activity:  Psychomotor Retardation  Concentration:  Concentration: Fair and Attention Span: Fair  Recall:  [employer] of Knowledge:  Fair  Language:  Fair  Akathisia:   No  Handed:  Right  AIMS (if indicated):     Assets:  Communication Skills Desire for Improvement Financial Resources/Insurance Housing Physical Health Resilience Social Support Transportation Vocational/Educational  ADL's:  Intact  Cognition:  WNL  Sleep:  Number of Hours: 8     Treatment Plan Summary: Daily contact with patient to assess and evaluate symptoms and progress in treatment and Medication management   Mr. Vallee is a 22 year old male with a history of bipolar disorder and severe anxiety admitted for suicidal ideation in the context of substance abuse.  #Suicidal ideation -patient able to contract for safety in the hospital  #Mood/anxiety, severe psychomotor retardation  -continue  Luvox 100 mg nightly  -lower Seroquel to 100 mg nightly -Vistaril 25 mg PRN -lower depakote to 500 mg BID, VPA level 102   #Insomnia, resolved  #Benzodiazepine withdrawal -Librium taper completed -patient motivated for change -prefers autpatient setting  #Metabolic syndrome monitoring -Lipid panel, TSH and HgbA1C areunremarkable -EKG,QTc 429  #Disposition -discharge to home -follow up with Dr. Meredeth Ide, MD 10/23/2017, 5:51 AM

## 2017-10-23 NOTE — Plan of Care (Signed)
Patient alert and oriented x 4. Up ad lib with a steady gait. Affect remains flat and sad. Observed lying in bed,resting with eyes closed for the majority of the morning. Compliant with meals and medications. Depakote level elevated this morning, dose decreased from 750 mg to 500 mg. Patient was educated on this decrease. Milieu remains safe.

## 2017-10-24 MED ORDER — QUETIAPINE FUMARATE 100 MG PO TABS: 100.0000 mg | ORAL_TABLET | Freq: Every day | ORAL | 1 refills | Status: DC

## 2017-10-24 MED ORDER — FLUVOXAMINE MALEATE 100 MG PO TABS: 100.0000 mg | ORAL_TABLET | Freq: Every day | ORAL | 1 refills | Status: DC

## 2017-10-24 MED ORDER — DIVALPROEX SODIUM 500 MG PO DR TAB: 500.0000 mg | DELAYED_RELEASE_TABLET | Freq: Two times a day (BID) | ORAL | 1 refills | Status: DC

## 2017-10-24 NOTE — B[facility] Suicide Risk Assessment (Signed)
Stuart Harper INPATIENT:  Family/Significant Other Suicide Prevention Education  Suicide Prevention Education:  Education Completed; Stuart Harper, Roommate, Friend,  (name of family member/significant other) has been identified by the patient as the family member/significant other with whom the patient will be residing, and identified as the person(s) who will aid the patient in the event of a mental health crisis (suicidal ideations/suicide attempt).  With written consent from the patient, the family member/significant other has been provided the following suicide prevention education, prior to the and/or following the discharge of the patient.  The suicide prevention education provided includes the following:  Suicide risk factors  Suicide prevention and interventions  National Suicide Hotline telephone number  [facility] assessment telephone number  [facility] Emergency Assistance [place] and/or Residential Mobile Crisis Unit telephone number  Request made of family/significant other to:  Remove weapons (e.g., guns, rifles, knives), all items previously/currently identified as safety concern.    Remove drugs/medications (over-the-counter, prescriptions, illicit drugs), all items previously/currently identified as a safety concern.  The family member/significant other verbalizes understanding of the suicide prevention education information provided.  The family member/significant other agrees to remove the items of safety concern listed above.  Stuart Harper Stuart Srey, LCSW 10/24/2017, 11:26 AM

## 2017-10-24 NOTE — Progress Notes (Signed)
Recreation Therapy Notes   Date: 02.05.2019  Time: 1:00 PM  Location: Craft Room  Behavioral response: N/A  Intervention Topic: Stress  Discussion/Intervention: Patient did not attend group.  Clinical Observations/Feedback: Patient did not attend group.  [place] LRT/CTRS           Juanjesus Pepperman 10/24/2017 2:36 PM

## 2017-10-24 NOTE — B[facility] Group Notes (Signed)
10/24/2017 9:30AM  Type of Therapy/Topic:  Group Therapy:  Feelings about Diagnosis  Participation Level:  Active   Description of Group:   This group will allow patients to explore their thoughts and feelings about diagnoses they have received. Patients will be guided to explore their level of understanding and acceptance of these diagnoses. Facilitator will encourage patients to process their thoughts and feelings about the reactions of others to their diagnosis and will guide patients in identifying ways to discuss their diagnosis with significant others in their lives. This group will be process-oriented, with patients participating in exploration of their own experiences, giving and receiving support, and processing challenge from other group members.   Therapeutic Goals: 1. Patient will demonstrate understanding of diagnosis as evidenced by identifying two or more symptoms of the disorder 2. Patient will be able to express two feelings regarding the diagnosis 3. Patient will demonstrate their ability to communicate their needs through discussion and/or role play  Summary of Patient Progress: Actively and appropriately engaged in the group. Patient was able to provide support and validation to other group members.Patient practiced active listening when interacting with the facilitator and other group members Patient in still in the process of obtaining treatment goals. Stuart Harper spoke about his symptoms of having anxiety. He also states that after he is discharged, he plans to "talk to my support system about my diagnosis and be more open with it."      Therapeutic Modalities:   Cognitive Behavioral Therapy Brief Therapy Feelings Identification    Darin Engels, LCSW 10/24/2017 10:21 AM

## 2017-10-24 NOTE — B[facility] Suicide Risk Assessment (Signed)
[facility] Discharge Suicide Risk Assessment   Principal Problem: Bipolar I disorder, most recent episode depressed, severe without psychotic features [facility]) Discharge Diagnoses:  Patient Active Problem List   Diagnosis Date Noted  . Bipolar I disorder, most recent episode depressed, severe without psychotic features ([place]) [F31.4] 10/19/2017    Priority: High  . Sedative, hypnotic or anxiolytic use disorder, severe, dependence ([place]) [F13.20] 10/20/2017  . Benzodiazepine withdrawal ([place]) [F13.239] 10/20/2017  . OCD (obsessive compulsive disorder) [F42.9] 10/20/2017  . Severe recurrent major depression without psychotic features ([place]) [F33.2] 10/16/2017  . Panic disorder [F41.0] 10/16/2017  . Cannabis use disorder, severe, dependence ([place]) [F12.20] 04/21/2017  . Hallucinogenic mushrooms use disorder, severe ([place]) [F16.20] 04/21/2017  . Cannabis abuse, daily use [F12.10] 06/03/2016  . Situational anxiety [F41.8] 06/03/2016  . Asthma [J45.909] 05/26/2016  . Atopic eczema [L20.9] 05/26/2016  . Screening [Z13.9] 04/29/2015  . 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 Allergic rhinitis, seasonal [J30.2] 07/14/2014    Total Time spent with patient: 30 minutes  Musculoskeletal: Strength & Muscle Tone: within normal limits Gait & Station: normal Patient leans: N/A  Psychiatric Specialty Exam: Review of Systems  Neurological: Negative.   Psychiatric/Behavioral: Negative.   All other systems reviewed and are negative.   Blood pressure 110/83, pulse (!) 104, temperature 97.8 F (36.6 C), resp. rate 18, height 6' (1.829 m), weight 63.5 kg (140 lb), SpO2 98 %.Body mass index is 18.99 kg/m.  General Appearance: Casual  Eye Contact::  Good  Speech:  Clear and Coherent409  Volume:  Normal  Mood:  Euthymic  Affect:  Appropriate  Thought Process:  Goal Directed and Descriptions of Associations: Intact  Orientation:  Full (Time, Place, and Person)  Thought Content:   WDL  Suicidal Thoughts:  No  Homicidal Thoughts:  No  Memory:  Immediate;   Fair Recent;   Fair Remote;   Fair  Judgement:  Impaired  Insight:  Present  Psychomotor Activity:  Normal  Concentration:  Fair  Recall:  [employer] of [place]  Language: Fair  Akathisia:  No  Handed:  Right  AIMS (if indicated):     Assets:  Communication Skills Desire for Improvement Financial Resources/Insurance Housing Physical Health Resilience Social Support Transportation Vocational/Educational  Sleep:  Number of Hours: 7.45  Cognition: WNL  ADL's:  Intact   Mental Status Per Nursing Assessment::   On Admission:     Demographic Factors:  Male, Adolescent or young adult and Caucasian  Loss Factors: NA  Historical Factors: Impulsivity  Risk Reduction Factors:   Sense of responsibility to family, Religious beliefs about death, Employed, Living with another person, especially a relative and Positive social support  Continued Clinical Symptoms:  Bipolar Disorder:   Depressive phase Depression:   Comorbid alcohol abuse/dependence Insomnia Alcohol/Substance Abuse/Dependencies  Cognitive Features That Contribute To Risk:  None    Suicide Risk:  Minimal: No identifiable suicidal ideation.  Patients presenting with no risk factors but with morbid ruminations; may be classified as minimal risk based on the severity of the depressive symptoms  Follow-up Information    [facility], [place] on 11/27/2017.   Why:  11:20am  for hospital follow up appointment. They will schedule you with therapist after initial visit. Please bring your Id and Insurance card to your first visit.  Arrive 20 min early and if you need to reschedule do so at least 1 business day ahead. Contact information: [place] [address] [phone]  Plan Of Care/Follow-up recommendations:  Activity:  as tolerated Diet:  regular Other:  keep follow up  appointments  Orson Slick, MD 10/24/2017, 10:32 AM

## 2017-10-24 NOTE — Progress Notes (Signed)
D: Patient is aware of  Discharge this shift .Patient denies suicidal /homicidal ideations. Patient received all belongings brought in  A: No Storage medications. Writer reviewed Discharge Summary, Suicide Risk Assessment, and Transitional Record. Patient also received Prescriptions   from  MD and return letter..  Aware  Of follow up appointment . R: Patient left unit with no questions  Or concerns  With friend.

## 2017-10-24 NOTE — Telephone Encounter (Signed)
Advised patient of results.  

## 2017-10-24 NOTE — Progress Notes (Signed)
  [facility] Adult Case Management Discharge Plan :  Will you be returning to the same living situation after discharge:  Yes,    At discharge, do you have transportation home?: Yes,    Do you have the ability to pay for your medications: Yes,     Release of information consent forms completed and in the chart;  Patient's signature needed at discharge.  Patient to Follow up at: Follow-up Information    [facility], [place] on 11/27/2017.   Why:  11:20am  for hospital follow up appointment. They will schedule you with therapist after initial visit. Please bring your Id and Insurance card to your first visit.  Arrive 20 min early and if you need to reschedule do so at least 1 business day ahead. Contact information: [place] [address] [phone]           Next level of care provider has access to [place] and Suicide Prevention discussed: Yes,     Have you used any form of tobacco in the last 30 days? (Cigarettes, Smokeless Tobacco, Cigars, and/or Pipes): No  Has patient been referred to the Quitline?: N/A patient is not a smoker  Patient has been referred for addiction treatment: Yes  August Saucer, LCSW 10/24/2017, 11:27 AM

## 2017-10-24 NOTE — Discharge Summary (Signed)
Physician Discharge Summary Note  Patient:  Stuart Harper is an 22 y.o., male MRN:  [id number] DOB:  01/26/1996 Patient phone:  [phone] (home)  Patient address:   [address] [id number],  Total Time spent with patient: 30 minutes  Date of Admission:  10/19/2017 Date of Discharge: 10/24/2017  Reason for Admission:  Suicidal ideation  History of Present Illness:   Identifying data. [name] is a 22 year old male with a history of bipolar disorder.  Chief complaint.  History of present illness. Information was obtained from the patient and the chart. The patient was brought to the ER by his outpatient psychiatrist, Dr. [name]. He came to her office complaining of depression, severe anxiety and insomnia and suicidal ideation with a plan to stab  himself in the neck. He kept a knife on bedside table. He has experienced more depression and anxiety since December of 2018. Last week, he became anxious again in the context of Adderall and Xanax abuse. After he stopped, he started experiencing symptoms of withdrawal. He made appointment with Dr. [name]. He reports extremely poor sleep, decreased appetite with 5 lbs weight loss, anhedonia, feeling of hopelessness helplessness and guilt, poor energy and concentration, crying spells, social isolation, and heightened anxiety that culminated in suicidal thinking. There were no psychotic symptoms. He does not report history of manic episodes outside of Adderrall use. He reports social He smokes cannabis regularly. He has been abusing Xanax and Adderall for 6 months. He id not a drinker.  Past psychiatric history. The patient has a long history of depression and anxiety of OCD type since 2005 when the family relocated from [place] to the [name]. He did see a counselor then. In 2017 he had an episode of severe depression, anxiety, insomnia lasting about two weeks and was treated with Clonazepam and Lexapro. Less than a year ago he  started seeing Dr. [name] and was prescribe low dose seroquel. He stopped taking it as it made him somnolent. In December of 2018, he returned to the ER complaining again of depression, anxiety and insomnia. He was started on low dose Zyprexa and Trazodone. He stopped taking it when run out. Two days ago, he went to his PCP who restarted Seroquel 50 mg nightly. There were no prior hospitalizations or suicide attempts.  Family psychiatric history. Grandmother with bipolar and substance abuse.  Social history. Born in [place], raised in [place]. Came to the [name] in 2005. This caused severe anxiety. Graduated from [employer] with double major in [place] and international studies. 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 Works at [employer]. Lives with 2 housemates. Parents went to [place] as missionaries a month ago. They will be back in 4 years.  Principal Problem: Bipolar I disorder, most recent episode depressed, severe without psychotic features [facility]) Discharge Diagnoses: Patient Active Problem List   Diagnosis Date Noted  . Bipolar I disorder, most recent episode depressed, severe without psychotic features ([place]) [F31.4] 10/19/2017    Priority: High  . Sedative, hypnotic or anxiolytic use disorder, severe, dependence ([place]) [F13.20] 10/20/2017  . Benzodiazepine withdrawal ([place]) [F13.239] 10/20/2017  . OCD (obsessive compulsive disorder) [F42.9] 10/20/2017  . Severe recurrent major depression without psychotic features ([place]) [F33.2] 10/16/2017  . Panic disorder [F41.0] 10/16/2017  . Cannabis use disorder, severe, dependence ([place]) [F12.20] 04/21/2017  . Hallucinogenic mushrooms use disorder, severe ([place]) [F16.20] 04/21/2017  . Cannabis abuse, daily use [F12.10] 06/03/2016  . Situational anxiety [F41.8] 06/03/2016  .  Asthma [J45.909] 05/26/2016  . Atopic eczema [L20.9] 05/26/2016  . Screening [Z13.9] 04/29/2015  . 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 Allergic rhinitis, seasonal [J30.2] 07/14/2014   Past Medical History:  Past Medical History:  Diagnosis Date  . Allergy   . Anxiety   . Asthma   . Seasonal allergies     Past Surgical History:  Procedure Laterality Date  . COLONOSCOPY WITH PROPOFOL N/A 05/26/2017   Procedure: COLONOSCOPY WITH PROPOFOL;  Surgeon: [name], MD;  Location: [facility] ENDOSCOPY;  Service: Gastroenterology;  Laterality: N/A;  . ESOPHAGOGASTRODUODENOSCOPY (EGD) WITH PROPOFOL N/A 05/26/2017   Procedure: ESOPHAGOGASTRODUODENOSCOPY (EGD) WITH PROPOFOL;  Surgeon: [name], MD;  Location: [facility] ENDOSCOPY;  Service: Gastroenterology;  Laterality: N/A;  . None     Family History:  Family History  Problem Relation Age of Onset  . Hypertension Father   . Allergies Father   . Osteoarthritis Father   . Depression Father   . Allergies Mother   . Asthma Mother   . Migraines Mother   . Asthma Paternal Uncle   . Lung cancer Maternal Grandfather   . Bipolar disorder Paternal Grandmother   . Alzheimer's disease Paternal Grandfather   . CAD Other    Social History:  Social History   Substance and Sexual Activity  Alcohol Use No     Social History   Substance and Sexual Activity  Drug Use Yes  . Frequency: 14.0 times per week  . Types: Marijuana   Comment: used everyday    Social History   Socioeconomic History  . Marital status: Single    Spouse name: None  . Number of children: 0  . Years of education: None  . Highest education level: Associate degree: occupational, Hotel manager, or vocational program  Social Needs  . Financial resource strain: Not hard at all  . Food insecurity - worry: Never true  . Food insecurity - inability: Never true  . Transportation needs - medical: No  . Transportation needs - non-medical: No  Occupational History  . None  Tobacco Use  . Smoking status: Never Smoker  . Smokeless tobacco: Never Used  Substance and Sexual Activity  . Alcohol use: No  . Drug use: Yes     Frequency: 14.0 times per week    Types: Marijuana    Comment: used everyday  . Sexual activity: Not Currently  Other Topics Concern  . None  Social History Narrative   Lives with parents in 2 story home.     Student at [employer], sophomore. 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             Hospital Course:    Mr. Shostak is a 22 year old male with a history of bipolar disorder and severe anxiety admitted for suicidal ideation in the context of substance abuse. Suicidal ideation has resolved and the patient is able to contract for safety.  #Mood/anxiety -continue depakote 500 mg BID, VPA level on 1500 mg was 102 -continue  Luvox100 mg nightly -continue Seroquel to 100 mg nightly  #Insomnia, resolved  #Benzodiazepine withdrawal -Librium taper completed -patient motivated for change -prefers autpatient setting  #Metabolic syndrome monitoring -Lipid panel, TSH and HgbA1C areunremarkable -EKG,QTc 429  #Disposition -discharge to home -follow up with CBC   Physical Findings: AIMS: Facial and Oral Movements Muscles of Facial Expression: None, normal Lips and Perioral Area: None, normal Jaw: None, normal Tongue: None,  normal,Extremity Movements Upper (arms, wrists, hands, fingers): None, normal Lower (legs, knees, ankles, toes): None, normal, Trunk Movements Neck, shoulders, hips: None, normal, Overall Severity Severity of abnormal movements (highest score from questions above): None, normal Incapacitation due to abnormal movements: None, normal Patient's awareness of abnormal movements (rate only patient's report): No Awareness, Dental Status Current problems with teeth and/or dentures?: No Does patient usually wear dentures?: No  CIWA:    COWS:     Musculoskeletal: Strength & Muscle Tone: within normal limits Gait & Station: normal Patient leans: N/A  Psychiatric Specialty Exam: Physical Exam   Nursing note and vitals reviewed. Psychiatric: He has a normal mood and affect. His speech is normal and behavior is normal. Judgment and thought content normal. Cognition and memory are normal.    Review of Systems  Neurological: Negative.   Psychiatric/Behavioral: Negative.   All other systems reviewed and are negative.   Blood pressure 110/83, pulse (!) 104, temperature 97.8 F (36.6 C), resp. rate 18, height 6' (1.829 m), weight 63.5 kg (140 lb), SpO2 98 %.Body mass index is 18.99 kg/m.  General Appearance: Casual  Eye Contact:  Good  Speech:  Clear and Coherent  Volume:  Normal  Mood:  Euthymic  Affect:  Appropriate  Thought Process:  Goal Directed and Descriptions of Associations: Intact  Orientation:  Full (Time, Place, and Person)  Thought Content:  WDL  Suicidal Thoughts:  No  Homicidal Thoughts:  No  Memory:  Immediate;   Fair Recent;   Fair Remote;   Fair  Judgement:  Impaired  Insight:  Present  Psychomotor Activity:  Normal  Concentration:  Concentration: Fair and Attention Span: Fair  Recall:  [employer] of Knowledge:  Fair  Language:  Fair  Akathisia:  No  Handed:  Right  AIMS (if indicated):     Assets:  Communication Skills Desire for Improvement Financial Resources/Insurance Housing Physical Health Resilience Social Support Transportation Vocational/Educational  ADL's:  Intact  Cognition:  WNL  Sleep:  Number of Hours: 7.45     Have you used any form of tobacco in the last 30 days? (Cigarettes, Smokeless Tobacco, Cigars, and/or Pipes): No  Has this patient used any form of tobacco in the last 30 days? (Cigarettes, Smokeless Tobacco, Cigars, and/or Pipes) Yes, No  Blood Alcohol level:  Lab Results  Component Value Date   ETH <10 10/16/2017   ETH <10 40/04/6760    Metabolic Disorder Labs:  Lab Results  Component Value Date   HGBA1C 5.1 10/20/2017   MPG 99.67 10/20/2017   No results found for: PROLACTIN Lab Results  Component Value  Date   CHOL 126 10/20/2017   TRIG 60 10/20/2017   HDL 41 10/20/2017   CHOLHDL 3.1 10/20/2017   VLDL 12 10/20/2017   [place] 73 10/20/2017    See Psychiatric Specialty Exam and Suicide Risk Assessment completed by Attending Physician prior to discharge.  Discharge destination:  Home  Is patient on multiple antipsychotic therapies at discharge:  No   Has Patient had three or more failed trials of antipsychotic monotherapy by history:  No  Recommended Plan for Multiple Antipsychotic Therapies: NA  Discharge Instructions    Diet - low sodium heart healthy   Complete by:  As directed    Increase activity slowly   Complete by:  As directed      Allergies as of 10/24/2017      Reactions   Shellfish Allergy Rash      Medication List  STOP taking these medications   FLUoxetine 20 MG capsule Commonly known as:  PROZAC   traZODone 100 MG tablet Commonly known as:  DESYREL     TAKE these medications     Indication  cetirizine 5 MG tablet Commonly known as:  ZYRTEC Take by mouth.  Indication:  Hayfever   divalproex 500 MG DR tablet Commonly known as:  DEPAKOTE Take 1 tablet (500 mg total) by mouth every 12 (twelve) hours.  Indication:  Manic Phase of Manic-Depression   fluticasone 50 MCG/ACT nasal spray Commonly known as:  FLONASE Place into the nose.  Indication:  Signs and Symptoms of Nose Diseases   fluvoxaMINE 100 MG tablet Commonly known as:  LUVOX Take 1 tablet (100 mg total) by mouth at bedtime.  Indication:  Obsessive Compulsive Disorder   QUEtiapine 100 MG tablet Commonly known as:  SEROQUEL Take 1 tablet (100 mg total) by mouth at bedtime. What changed:    medication strength  how much to take  Indication:  Depressive Phase of Manic-Depression   valACYclovir 1000 MG tablet Commonly known as:  VALTREX TAKE 2 TABLETS BY MOUTH TWICE A DAY AS NEEDED  Indication:  Genital Herpes      Follow-up Information    [facility] [place] [place] on  11/27/2017.   Why:  11:20am  for hospital follow up appointment. They will schedule you with therapist after initial visit. Please bring your Id and Insurance card to your first visit.  Arrive 20 min early and if you need to reschedule do so at least 1 business day ahead. Contact information: [place] [address] [phone]           Follow-up recommendations:  Activity:  as tolerated Diet:  low sodium heart healthy Other:  keep follow up appointments  Comments:    Signed: Orson Slick, MD 10/24/2017, 11:47 AM

## 2017-10-24 NOTE — Progress Notes (Signed)
Patient ID: Stuart Harper, male   DOB: 20-May-1996, 22 y.o.   MRN: [id number] Observed playing card game with peers, pleasant on approach, polite, explanations of medications, therapeutic effects, S&S of SEs discussed; understanding verbalized; denied SI/HI/AVH.

## 2017-10-24 NOTE — Progress Notes (Signed)
Recreation Therapy Notes  INPATIENT RECREATION TR PLAN  Patient Details Name: Isaak Delmundo MRN: [id number] DOB: [birth year] Today's Date: 10/24/2017  Rec Therapy Plan Is patient appropriate for Therapeutic Recreation?: Yes Treatment times per week: at least 3 Estimated Length of Stay: 5-7 days TR Treatment/Interventions: Group participation (Comment)  Discharge Criteria Pt will be discharged from therapy if:: Discharged Treatment plan/goals/alternatives discussed and agreed upon by:: Patient/family  Discharge Summary Short term goals set: Patient will identify 3 triggers for depression within 5 recreation therapy group sessions Short term goals met: Adequate for discharge Progress toward goals comments: Groups attended Which groups?: Leisure education, Goal setting Reason goals not met: N/A Therapeutic equipment acquired: N/A Reason patient discharged from therapy: Discharge from hospital Pt/family agrees with progress & goals achieved: Yes Date patient discharged from therapy: 10/24/17   Angeleigh Chiasson 10/24/2017, 2:39 PM

## 2017-10-24 NOTE — Plan of Care (Signed)
Patient slept for Estimated Hours of 7.45; Precautionary checks every 15 minutes for safety maintained, room free of safety hazards, patient sustains no injury or falls during this shift.  

## 2017-12-19 ENCOUNTER — Other Ambulatory Visit: Payer: Self-pay | Admitting: Psychiatry

## 2018-10-12 ENCOUNTER — Ambulatory Visit: Payer: Commercial Managed Care - PPO | Admitting: Family Medicine

## 2019-05-28 ENCOUNTER — Other Ambulatory Visit: Payer: Self-pay | Admitting: Registered"

## 2019-05-28 DIAGNOSIS — Z20822 Contact with and (suspected) exposure to covid-19: Secondary | ICD-10-CM

## 2019-05-30 LAB — NOVEL CORONAVIRUS, NAA: SARS-CoV-2, NAA: NOT DETECTED

## 2019-09-06 ENCOUNTER — Telehealth: Payer: Self-pay

## 2019-09-06 NOTE — Telephone Encounter (Signed)
Tried calling patient. Left message to call back. OK for [facility] nurse to advise patient.

## 2019-09-06 NOTE — Telephone Encounter (Signed)
[name] (Patient) Vassie Moselle Jetty Duhamel (Patient) General - Inquiry Summary: antibodies test Reason for CRM: Pt wants to know if he can have a COVID antibodies test done. Patient Information Patient Name Gender DOB SSN Braedin, Jolly Male 10-19-1995 [id number] Contacts Type Contact Phone 09/06/2019 02:17 PM EST Phone (Incoming) Norberto Sorenson, Finis Howard (Self) [phone] (H) Routing History From To Priority 09/06/2019 02:19 PM Sheppard Coil, [place] POOL Routine

## 2019-09-06 NOTE — Telephone Encounter (Signed)
There is no clinical indication for antibody testing. It is not known how much or even if having antibodies protects you from catching or spreading the virus. Having antibodies makes no difference in recommendations for mask wearing, social distancing or need for being vaccinated. It's only use is to see if you qualify for donating plasma for Covid treatment.

## 2019-09-09 NOTE — Telephone Encounter (Signed)
Tried calling patient. Left message to call back. 

## 2019-09-09 NOTE — Telephone Encounter (Signed)
Pt called back and read word for word the dr message to the pt.. Pt verbalized understanding.  Nothing further needed.

## 2019-11-08 ENCOUNTER — Encounter: Payer: Self-pay | Admitting: Family Medicine

## 2019-11-08 ENCOUNTER — Other Ambulatory Visit: Payer: Self-pay

## 2019-11-08 ENCOUNTER — Ambulatory Visit (INDEPENDENT_AMBULATORY_CARE_PROVIDER_SITE_OTHER): Payer: Commercial Managed Care - PPO | Admitting: Family Medicine

## 2019-11-08 ENCOUNTER — Ambulatory Visit: Payer: Commercial Managed Care - PPO | Admitting: Family Medicine

## 2019-11-08 VITALS — BP 131/84 | HR 93 | Temp 96.6°F | Ht 72.0 in | Wt 176.6 lb

## 2019-11-08 DIAGNOSIS — F419 Anxiety disorder, unspecified: Secondary | ICD-10-CM | POA: Diagnosis not present

## 2019-11-08 DIAGNOSIS — F331 Major depressive disorder, recurrent, moderate: Secondary | ICD-10-CM

## 2019-11-08 DIAGNOSIS — F1911 Other psychoactive substance abuse, in remission: Secondary | ICD-10-CM

## 2019-11-08 DIAGNOSIS — F410 Panic disorder [episodic paroxysmal anxiety] without agoraphobia: Secondary | ICD-10-CM

## 2019-11-08 NOTE — Patient Instructions (Addendum)
Please go to the lab draw station in [address] on the second floor of [facility] . Normal hours are 8:00am to 12:30pm and 1:30pm to 4:00pm Monday through Friday  . Regular exercise is very helpful for anxiety and depression. You will need to exercise about 30 minutes a day, or an hour every other day for the most physical and phycological benefits from exercise

## 2019-11-08 NOTE — Progress Notes (Signed)
Patient: Stuart Harper Male    DOB: Mar 23, 1996   24 y.o.   MRN: [id number] Visit Date: 11/08/2019  Today's Provider: Lelon Huh, MD   Chief Complaint  Patient presents with  . Referral   Subjective:     HPI Patient is requesting a psychiatrist and counselor referral. Patient is requesting to be tested for ADHD and Anxiety as well. He has long history of anxiety and polysubstance abuse. Has been followed by Dr. Kasandra Knudsen off and on and reports that since his last visit here in 2019 he has been prescribed Luvox and Seroquel which he has taken inconsistently. He states he does feel terrible when he runs out of Luvox. He had follow up with Dr. Kasandra Knudsen last week and started back on both medications and states it was recommended he see a psychologist for additional anxiety and ADD testing. However, he states that the the psychologists in Dr. Lilla Shook practice are not on his insurance.   He states he did graduate with a degree in [place] and [employer]. He worked at [employer] after graduation, but quit due to 'a lot of complicated factors' last summer. He is no longer working or going to school and spends him time taking care of chickens and walking his dogs, but does not do any structured activities or exercise.   Allergies  Allergen Reactions  . Shellfish Allergy Rash     Current Outpatient Medications:  .  fluvoxaMINE (LUVOX) 100 MG tablet, Take 1 tablet (100 mg total) by mouth at bedtime. (Patient taking differently: Take 50 mg by mouth at bedtime. ), Disp: 30 tablet, Rfl: 1 .  QUEtiapine (SEROQUEL) 100 MG tablet, Take 1 tablet (100 mg total) by mouth at bedtime., Disp: 30 tablet, Rfl: 1 .  valACYclovir (VALTREX) 1000 MG tablet, TAKE 2 TABLETS BY MOUTH TWICE A DAY AS NEEDED, Disp: , Rfl:  .  cetirizine (ZYRTEC) 5 MG tablet, Take by mouth., Disp: , Rfl:  .  divalproex (DEPAKOTE) 500 MG DR tablet, Take 1 tablet (500 mg total) by mouth every 12 (twelve) hours. (Patient not  taking: Reported on 11/08/2019), Disp: 60 tablet, Rfl: 1 .  fluticasone (FLONASE) 50 MCG/ACT nasal spray, Place into the nose., Disp: , Rfl:   Review of Systems  Constitutional: Negative for appetite change, chills and fever.  Respiratory: Negative for chest tightness, shortness of breath and wheezing.   Cardiovascular: Negative for chest pain and palpitations.  Gastrointestinal: Negative for abdominal pain, nausea and vomiting.  Psychiatric/Behavioral: Positive for decreased concentration and sleep disturbance. The patient is nervous/anxious.        Depression     Social History   Tobacco Use  . Smoking status: Never Smoker  . Smokeless tobacco: Never Used  Substance Use Topics  . Alcohol use: No      Objective:   BP 131/84 (BP Location: Right Arm, Patient Position: Sitting, Cuff Size: Normal)   Pulse 93   Temp (!) 96.6 F (35.9 C) (Temporal)   Ht 6' (1.829 m)   Wt 176 lb 9.6 oz (80.1 kg)   BMI 23.95 kg/m  Vitals:   11/08/19 1548  BP: 131/84  Pulse: 93  Temp: (!) 96.6 F (35.9 C)  TempSrc: Temporal  Weight: 176 lb 9.6 oz (80.1 kg)  Height: 6' (1.829 m)  Body mass index is 23.95 kg/m.   Physical Exam   General: Appearance:    Well developed, well nourished male in no acute distress  Eyes:    PERRL, conjunctiva/corneas clear, EOM's intact       Lungs:     Clear to auscultation bilaterally, respirations unlabored  Heart:    Normal heart rate. Normal rhythm. No murmurs, rubs, or gallops.   MS:   All extremities are intact.   Neurologic:   Awake, alert, oriented x 3. No apparent focal neurological           defect.           Assessment & Plan    1. Anxiety Current medications managed by Dr. Kasandra Knudsen, but he feels he needs additional evaluation and possible counseling for mood disorder and possibly ADD.  - CBC - Comprehensive metabolic panel - TSH  2. Panic disorder   3. Moderate episode of recurrent major depressive disorder ([place])   4. History of substance  abuse [facility])   The entirety of the information documented in the History of Present Illness, Review of Systems and Physical Exam were personally obtained by me. Portions of this information were initially documented by Idelle Jo, CMA and reviewed by me for thoroughness and accuracy.      Lelon Huh, MD  [facility]

## 2019-11-16 LAB — CBC
Hematocrit: 44.7 % (ref 37.5–51.0)
Hemoglobin: 15.8 g/dL (ref 13.0–17.7)
MCH: 30.2 pg (ref 26.6–33.0)
MCHC: 35.3 g/dL (ref 31.5–35.7)
MCV: 85 fL (ref 79–97)
Platelets: 159 10*3/uL (ref 150–450)
RBC: 5.24 x10E6/uL (ref 4.14–5.80)
RDW: 12.5 % (ref 11.6–15.4)
WBC: 3.7 10*3/uL (ref 3.4–10.8)

## 2019-11-16 LAB — COMPREHENSIVE METABOLIC PANEL
ALT: 18 IU/L (ref 0–44)
AST: 18 IU/L (ref 0–40)
Albumin/Globulin Ratio: 2 (ref 1.2–2.2)
Albumin: 4.4 g/dL (ref 4.1–5.2)
Alkaline Phosphatase: 61 IU/L (ref 39–117)
BUN/Creatinine Ratio: 14 (ref 9–20)
BUN: 12 mg/dL (ref 6–20)
Bilirubin Total: 0.2 mg/dL (ref 0.0–1.2)
CO2: 24 mmol/L (ref 20–29)
Calcium: 8.6 mg/dL — ABNORMAL LOW (ref 8.7–10.2)
Chloride: 104 mmol/L (ref 96–106)
Creatinine, Ser: 0.84 mg/dL (ref 0.76–1.27)
GFR calc Af Amer: 141 mL/min/{1.73_m2} (ref >59)
GFR calc non Af Amer: 122 mL/min/{1.73_m2} (ref >59)
Globulin, Total: 2.2 g/dL (ref 1.5–4.5)
Glucose: 93 mg/dL (ref 65–99)
Potassium: 4.2 mmol/L (ref 3.5–5.2)
Sodium: 140 mmol/L (ref 134–144)
Total Protein: 6.6 g/dL (ref 6.0–8.5)

## 2019-11-16 LAB — TSH: TSH: 0.345 u[IU]/mL — ABNORMAL LOW (ref 0.450–4.500)

## 2019-11-19 LAB — SPECIMEN STATUS REPORT

## 2019-11-19 LAB — T4, FREE: Free T4: 1.28 ng/dL (ref 0.82–1.77)

## 2019-12-30 ENCOUNTER — Telehealth: Payer: Self-pay

## 2019-12-30 NOTE — Telephone Encounter (Signed)
Copied from [place] [id number]. Topic: Appointment Scheduling - Scheduling Inquiry for Clinic >> Dec 30, 2019  4:49 PM Mathis Bud wrote: Reason for CRM: Patient would like an STD test done Call back [phone]

## 2019-12-31 NOTE — Telephone Encounter (Signed)
LMTCB to schedule a appointment. If no available appointments can see a PA. Okay for PEC to advise and schedule.

## 2020-01-01 NOTE — Telephone Encounter (Signed)
Patient scheduled with PCP for 01/06/2020 preferred to wait for Dr. Caryn Section

## 2020-01-01 NOTE — Telephone Encounter (Signed)
FYI

## 2020-01-03 NOTE — Progress Notes (Signed)
    Established patient visit     Patient: Stuart Harper   DOB: [birth year]   24 y.o. Male  MRN: [id number] Visit Date: 01/06/2020  Today's healthcare provider: Lelon Huh, MD  Subjective:    Chief Complaint  Patient presents with  . STD check   HPI STD check:  Patient is requesting a STD screening. The patient's pertinent negatives include no genital injury, genital itching, genital lesions, pelvic pain, penile discharge, penile pain, priapism, scrotal swelling or testicular pain.    He also states he feels very sleepy in the morning and has been told by his room mates have told him snores very loudly and stops breathing when he sleeps.      Medications: Outpatient Medications Prior to Visit  Medication Sig  . cetirizine (ZYRTEC) 5 MG tablet Take by mouth.  . fluvoxaMINE (LUVOX) 100 MG tablet Take 1 tablet (100 mg total) by mouth at bedtime. (Patient taking differently: Take 50 mg by mouth at bedtime. )  . QUEtiapine (SEROQUEL) 100 MG tablet Take 1 tablet (100 mg total) by mouth at bedtime. (Patient taking differently: Take 25-50 mg by mouth at bedtime. As needed)  . valACYclovir (VALTREX) 1000 MG tablet TAKE 2 TABLETS BY MOUTH TWICE A DAY AS NEEDED  . divalproex (DEPAKOTE) 500 MG DR tablet Take 1 tablet (500 mg total) by mouth every 12 (twelve) hours. (Patient not taking: Reported on 11/08/2019)  . [DISCONTINUED] fluticasone (FLONASE) 50 MCG/ACT nasal spray Place into the nose.   No facility-administered medications prior to visit.       Objective:    BP (!) 136/92 (BP Location: Right Arm, Patient Position: Sitting, Cuff Size: Normal)   Pulse (!) 116   Temp (!) 96.6 F (35.9 C) (Temporal)   Ht 6' (1.829 m)   Wt 173 lb 12.8 oz (78.8 kg)   BMI 23.57 kg/m    Physical Exam  2+ bilateral tonsils  No results found for any visits on 01/06/20.    Assessment & Plan:    1. Screen for STD (sexually transmitted disease)  - RPR - Hepatitis C antibody -  Urine cytology ancillary only  2. Encounter for screening for HIV  - HIV Antibody (routine testing w rflx)  3. Hypersomnia Has been going on for several years. Is not obese but does have enlarged tonsils which is probably chronic - Home sleep test Would consider ENT evaluation if sleep test is positive.   4. Anxiety  - Ambulatory referral to Psychology  5. Panic disorder  - Ambulatory referral to Psychology   No follow-ups on file.     The entirety of the information documented in the History of Present Illness, Review of Systems and Physical Exam were personally obtained by me. Portions of this information were initially documented by the CMA and reviewed by me for thoroughness and accuracy.      Lelon Huh, MD  [facility] [phone] (phone) [phone] (fax)  [place]

## 2020-01-06 ENCOUNTER — Other Ambulatory Visit: Payer: Self-pay

## 2020-01-06 ENCOUNTER — Other Ambulatory Visit (HOSPITAL_COMMUNITY)
Admission: RE | Admit: 2020-01-06 | Discharge: 2020-01-06 | Disposition: A | Discharge status: Home / Self Care | Payer: Commercial Managed Care - PPO | Source: Ambulatory Visit | Attending: Family Medicine | Admitting: Family Medicine

## 2020-01-06 ENCOUNTER — Ambulatory Visit: Payer: Commercial Managed Care - PPO | Admitting: Family Medicine

## 2020-01-06 ENCOUNTER — Encounter: Payer: Self-pay | Admitting: Family Medicine

## 2020-01-06 VITALS — BP 136/92 | HR 116 | Temp 96.6°F | Ht 72.0 in | Wt 173.8 lb

## 2020-01-06 DIAGNOSIS — Z113 Encounter for screening for infections with a predominantly sexual mode of transmission: Secondary | ICD-10-CM | POA: Insufficient documentation

## 2020-01-06 DIAGNOSIS — Z114 Encounter for screening for human immunodeficiency virus [HIV]: Secondary | ICD-10-CM | POA: Diagnosis not present

## 2020-01-06 DIAGNOSIS — F419 Anxiety disorder, unspecified: Secondary | ICD-10-CM

## 2020-01-06 DIAGNOSIS — G4710 Hypersomnia, unspecified: Secondary | ICD-10-CM

## 2020-01-06 DIAGNOSIS — F410 Panic disorder [episodic paroxysmal anxiety] without agoraphobia: Secondary | ICD-10-CM

## 2020-01-07 LAB — HEPATITIS C ANTIBODY: Hep C Virus Ab: <0.1 s/co ratio (ref 0.0–0.9)

## 2020-01-07 LAB — RPR: RPR Ser Ql: NONREACTIVE

## 2020-01-08 LAB — URINE CYTOLOGY ANCILLARY ONLY
Chlamydia: NEGATIVE
Comment: NEGATIVE

## 2020-01-08 LAB — PULMONARY FUNCTION TEST

## 2020-01-10 DIAGNOSIS — G4733 Obstructive sleep apnea (adult) (pediatric): Secondary | ICD-10-CM | POA: Insufficient documentation

## 2020-01-17 ENCOUNTER — Telehealth: Payer: Self-pay | Admitting: Family Medicine

## 2020-01-17 ENCOUNTER — Encounter: Payer: Self-pay | Admitting: Family Medicine

## 2020-01-17 DIAGNOSIS — G4733 Obstructive sleep apnea (adult) (pediatric): Secondary | ICD-10-CM

## 2020-01-17 DIAGNOSIS — R5383 Other fatigue: Secondary | ICD-10-CM

## 2020-01-17 NOTE — Telephone Encounter (Signed)
Sleep Study shows he does have moderate sleep apnea, but only when sleeping on his back, but not when sleeping on side.  If he is not feeling excessively sleepy during the day, there is no need to treat this because he probably does not sleep on his back enough for it to be a problem.  If he does get to feeling excessively sleepy during the day, I would suggest seeing an ENT

## 2020-01-17 NOTE — Telephone Encounter (Signed)
Patient advised. He states he is feeling excessively tired during the day. Patient request to proceed with ENT referral. Referral placed.

## 2020-09-15 ENCOUNTER — Telehealth: Payer: Self-pay

## 2020-09-15 NOTE — Telephone Encounter (Signed)
Copied from CRM [id number]. Topic: General - Inquiry >> Sep 15, 2020  4:06 PM Adrian Prince D wrote: Reason for CRM: Parents both tested positive and he would like to get a covid test as soon as possible. He can be reached at [phone]. Please advise

## 2020-09-15 NOTE — Telephone Encounter (Signed)
Please advise if ok to place order for drive through clinic, or whether patient needs to schedule virtual visit.

## 2020-09-15 NOTE — Telephone Encounter (Signed)
That's fine. Thanks!

## 2020-09-16 NOTE — Telephone Encounter (Signed)
I called and spoke with patient. He reports he already had a COVID test done at another facility and is awaiting results.

## 2020-11-03 ENCOUNTER — Telehealth (INDEPENDENT_AMBULATORY_CARE_PROVIDER_SITE_OTHER): Payer: Commercial Managed Care - PPO | Admitting: Family Medicine

## 2020-11-03 DIAGNOSIS — G4733 Obstructive sleep apnea (adult) (pediatric): Secondary | ICD-10-CM

## 2020-11-03 DIAGNOSIS — J069 Acute upper respiratory infection, unspecified: Secondary | ICD-10-CM

## 2020-11-03 NOTE — Progress Notes (Signed)
MyChart Video Visit    Virtual Visit via Video Note   This visit type was conducted due to national recommendations for restrictions regarding the COVID-19 Pandemic (e.g. social distancing) in an effort to limit this patient's exposure and mitigate transmission in our community. This patient is at least at moderate risk for complications without adequate follow up. This format is felt to be most appropriate for this patient at this time. Physical exam was limited by quality of the video and audio technology used for the visit.   Patient location: home Provider location: bfp  I discussed the limitations of evaluation and management by telemedicine and the availability of in person appointments. The patient expressed understanding and agreed to proceed.  Patient: Stuart Harper   DOB: November 28, 1995   25 y.o. Male  MRN: [id number] Visit Date: 11/03/2020  Today's healthcare provider: Lelon Huh, MD   No chief complaint on file.  Subjective    HPI  Pt would like to talk about recent sleep study and about his URI as follows.  Upper Respiratory Infection: Patient complains of symptoms of a URI. Symptoms include cough, diarrhea, sore throat and vomiting. Onset of symptoms was 4 days ago, gradually worsening since that time. He also c/o sneezing, night sweats, generalized body aches for the past 4 days .  He is drinking plenty of fluids. Evaluation to date: none. Treatment to date: ibuprofen. More coughing yesterday and today, lost voice. Some chills and sweats. Not had covid test Had positive PCR Covid test in [facility] January. Had two doses of pfizer. Needs work excuse.   Also, he had home sleep study last April with moderate sleep apnea. He was referred to ENT due o his young age and normal body habitus, but apparently not felt to be surgical candidate. He states he tried fluticasone nasal spray. He states he has not improved at all and wants to proceed with CPAP  machine    Medications: Outpatient Medications Prior to Visit  Medication Sig  . cetirizine (ZYRTEC) 5 MG tablet Take by mouth.  . divalproex (DEPAKOTE) 500 MG DR tablet Take 1 tablet (500 mg total) by mouth every 12 (twelve) hours. (Patient not taking: Reported on 11/08/2019)  . fluvoxaMINE (LUVOX) 100 MG tablet Take 1 tablet (100 mg total) by mouth at bedtime. (Patient taking differently: Take 50 mg by mouth at bedtime. )  . QUEtiapine (SEROQUEL) 100 MG tablet Take 1 tablet (100 mg total) by mouth at bedtime. (Patient taking differently: Take 25-50 mg by mouth at bedtime. As needed)  . valACYclovir (VALTREX) 1000 MG tablet TAKE 2 TABLETS BY MOUTH TWICE A DAY AS NEEDED   No facility-administered medications prior to visit.    Review of Systems  Constitutional: Negative for chills and fever.  Respiratory: Positive for cough and shortness of breath.   Gastrointestinal: Positive for abdominal pain, diarrhea, nausea and vomiting.      Objective    There were no vitals taken for this visit.   Physical Exam   Awake, alert, oriented x 3. In no apparent distress   Assessment & Plan     1. Upper respiratory tract infection, unspecified type Unlikely to be covid since he has had two doses of Pfizer vaccine and had positive test about a month ago. Would still be best to stay home from work for 5 days after sx onset. Work excuse to return to work on 11/05/20  2. OSA (obstructive sleep apnea) Failed conservative therapy and not thought  to be surgical candidate by ENT. Will order CPAP.    Video connection was lost when less than 50% of the duration of the visit was complete, at which time the remainder of the visit was completed via audio only.     I discussed the assessment and treatment plan with the patient. The patient was provided an opportunity to ask questions and all were answered. The patient agreed with the plan and demonstrated an understanding of the instructions.   The  patient was advised to call back or seek an in-person evaluation if the symptoms worsen or if the condition fails to improve as anticipated.  I provided 12 minutes of non-face-to-face time during this encounter.  The entirety of the information documented in the History of Present Illness, Review of Systems and Physical Exam were personally obtained by me. Portions of this information were initially documented by the CMA and reviewed by me for thoroughness and accuracy.     Lelon Huh, MD [facility] [phone] (phone) [phone] (fax)  [place]

## 2020-11-03 NOTE — Patient Instructions (Signed)
.   Please review the attached list of medications and notify my office if there are any errors.   . Please bring all of your medications to every appointment so we can make sure that our medication list is the same as yours.   

## 2020-11-24 ENCOUNTER — Other Ambulatory Visit: Payer: Self-pay

## 2020-11-24 ENCOUNTER — Encounter: Payer: Self-pay | Admitting: Family Medicine

## 2020-11-24 ENCOUNTER — Telehealth (INDEPENDENT_AMBULATORY_CARE_PROVIDER_SITE_OTHER): Payer: Commercial Managed Care - PPO | Admitting: Family Medicine

## 2020-11-24 DIAGNOSIS — F418 Other specified anxiety disorders: Secondary | ICD-10-CM

## 2020-11-24 DIAGNOSIS — F332 Major depressive disorder, recurrent severe without psychotic features: Secondary | ICD-10-CM

## 2020-11-24 DIAGNOSIS — R112 Nausea with vomiting, unspecified: Secondary | ICD-10-CM | POA: Diagnosis not present

## 2020-11-24 DIAGNOSIS — F410 Panic disorder [episodic paroxysmal anxiety] without agoraphobia: Secondary | ICD-10-CM | POA: Diagnosis not present

## 2020-11-24 DIAGNOSIS — K589 Irritable bowel syndrome without diarrhea: Secondary | ICD-10-CM

## 2020-11-24 DIAGNOSIS — F429 Obsessive-compulsive disorder, unspecified: Secondary | ICD-10-CM | POA: Diagnosis not present

## 2020-11-24 MED ORDER — ONDANSETRON HCL 4 MG PO TABS: 4.0000 mg | ORAL_TABLET | Freq: Three times a day (TID) | ORAL | 2 refills | Status: DC | PRN

## 2020-11-24 MED ORDER — BUSPIRONE HCL 7.5 MG PO TABS: ORAL_TABLET | ORAL | 2 refills | Status: DC

## 2020-11-24 NOTE — Patient Instructions (Signed)
.   Please review the attached list of medications and notify my office if there are any errors.   . Please bring all of your medications to every appointment so we can make sure that our medication list is the same as yours.   

## 2020-11-24 NOTE — Progress Notes (Signed)
Virtual telephone visit    Virtual Visit via Telephone Note   This visit type was conducted due to national recommendations for restrictions regarding the COVID-19 Pandemic (e.g. social distancing) in an effort to limit this patient's exposure and mitigate transmission in our community. Due to his co-morbid illnesses, this patient is at least at moderate risk for complications without adequate follow up. This format is felt to be most appropriate for this patient at this time. The patient did not have access to video technology or had technical difficulties with video requiring transitioning to audio format only (telephone). Physical exam was limited to content and character of the telephone converstion.    Patient location: home Provider location: bfp  I discussed the limitations of evaluation and management by telemedicine and the availability of in person appointments. The patient expressed understanding and agreed to proceed.   Visit Date: 11/24/2020  Today's healthcare provider: Lelon Huh, MD   Chief Complaint  Patient presents with  . Emesis   Subjective    Emesis  This is a new problem. Episode onset: 5 days ago. The problem has been unchanged. There has been no fever. Associated symptoms include abdominal pain, chills, diarrhea and sweats. Pertinent negatives include no chest pain or fever. He has tried nothing for the symptoms.    Patient reports having a past history of IBS which he states worsens whenever he is under stress. He has been having much more stress related to his job. He has been followed by psychiatry up until last year for chronic anxiety, but he has now stopped taking Seroquel and has weaned himself down to 1/2 tablet a day of fluvoxamine.       Medications: Outpatient Medications Prior to Visit  Medication Sig  . cetirizine (ZYRTEC) 5 MG tablet Take 10 mg by mouth daily.  . fluticasone (FLONASE) 50 MCG/ACT nasal spray Place 2 sprays into both  nostrils daily.  . fluvoxaMINE (LUVOX) 100 MG tablet Take 1 tablet (100 mg total) by mouth at bedtime. (Patient taking differently: Take 50 mg by mouth at bedtime.)  . valACYclovir (VALTREX) 1000 MG tablet TAKE 2 TABLETS BY MOUTH TWICE A DAY AS NEEDED  . QUEtiapine (SEROQUEL) 100 MG tablet Take 1 tablet (100 mg total) by mouth at bedtime. (Patient not taking: Reported on 11/24/2020)  . [DISCONTINUED] divalproex (DEPAKOTE) 500 MG DR tablet Take 1 tablet (500 mg total) by mouth every 12 (twelve) hours. (Patient not taking: Reported on 11/24/2020)   No facility-administered medications prior to visit.    Review of Systems  Constitutional: Positive for chills, diaphoresis and fatigue. Negative for appetite change and fever.  Respiratory: Negative for chest tightness, shortness of breath and wheezing.   Cardiovascular: Negative for chest pain and palpitations.  Gastrointestinal: Positive for abdominal pain, blood in stool, diarrhea and vomiting. Negative for nausea.  Psychiatric/Behavioral: The patient is nervous/anxious.       Objective       Awake, alert, oriented x 3. In no apparent distress   Assessment & Plan     1. Nausea and vomiting, intractability of vomiting not specified, unspecified vomiting type Likely viral gastroenteritis versus anxiety induced.  - ondansetron (ZOFRAN) 4 MG tablet; Take 1 tablet (4 mg total) by mouth every 8 (eight) hours as needed for nausea or vomiting.  Dispense: 20 tablet; Refill: 2  Work excuse from 11-19-2020 through 11-27-2020, RTW 11-30-2020  2. Situational anxiety start- busPIRone (BUSPAR) 7.5 MG tablet; 1/2 tablet twice a day for  1 week, then increase to 1 tablet twice a day for 2 weeks, then increase to 2 tablet twice a day  Dispense: 60 tablet; Refill: 2 - Ambulatory referral to Psychiatry  3. Obsessive-compulsive disorder, unspecified type Follow up with psychiatry referral as below.   4. Panic disorder  - Ambulatory referral to  Psychiatry  5. Irritable bowel syndrome, unspecified type   6. Severe recurrent major depression without psychotic features [facility])  - Ambulatory referral to Psychiatry    He needs to note from last Thursday through next Friday. Needs physical copy and through MyChart.  No follow-ups on file.    I discussed the assessment and treatment plan with the patient. The patient was provided an opportunity to ask questions and all were answered. The patient agreed with the plan and demonstrated an understanding of the instructions.   The patient was advised to call back or seek an in-person evaluation if the symptoms worsen or if the condition fails to improve as anticipated.  I provided 15 minutes of non-face-to-face time during this encounter.  The entirety of the information documented in the History of Present Illness, Review of Systems and Physical Exam were personally obtained by me. Portions of this information were initially documented by the CMA and reviewed by me for thoroughness and accuracy.     Lelon Huh, MD [facility] [phone] (phone) [phone] (fax)  [place]

## 2020-11-25 ENCOUNTER — Telehealth: Payer: Self-pay

## 2020-11-25 NOTE — Telephone Encounter (Signed)
Copied from [place] [id number]. Topic: General - Other >> Nov 25, 2020  9:33 AM Lennox Solders wrote: Reason for CRM: Pt will came today and pick up letter (work note ) Risk manager wrote yesterday

## 2020-11-25 NOTE — Telephone Encounter (Signed)
Letter placed up front for pick up

## 2020-12-10 ENCOUNTER — Telehealth: Payer: Self-pay | Admitting: Family Medicine

## 2020-12-10 NOTE — Telephone Encounter (Signed)
HR called to request additional information on patient being out of work.  Stated that they are faxing a form that needs some more information to confirm why patient is still out of work.  Please advise and confirm when fax is received.  CB# [phone]

## 2020-12-10 NOTE — Telephone Encounter (Signed)
[employer] sent forms to be updated and request clarification to why pt is out of work and possible return to work date. Forms placed in Dr. Maralyn Sago box. Blank copy on file with medical records. Please advise. Thanks TNP

## 2020-12-11 NOTE — Telephone Encounter (Signed)
Renne Crigler calling from [employer] (pt's employer) is calling for clarification on why the patient is being out of work. Needing a timeline- Megan= received a doctors note stating the pt would be out  3/3/-18/22 with return back to worrk on 12/07/20. Then received a leave of absence paperwork states 11/19/20- 11/29/20.  The pt still has not returned back to work and is need some clarity.  Cb- [phone] with confidential VM

## 2020-12-11 NOTE — Telephone Encounter (Signed)
Please advise 

## 2020-12-11 NOTE — Telephone Encounter (Signed)
That was an error, leave of absence should be 3/3 through 12/06/2020.

## 2020-12-14 NOTE — Telephone Encounter (Signed)
Megan w/ [employer] was advised that the dates was an error and she want a corrected letter faxed and the letter was faxed to [facility] with he correct dates on them.

## 2020-12-15 ENCOUNTER — Ambulatory Visit: Payer: Commercial Managed Care - PPO | Admitting: Family Medicine

## 2020-12-18 ENCOUNTER — Other Ambulatory Visit: Payer: Self-pay | Admitting: Family Medicine

## 2020-12-18 DIAGNOSIS — F418 Other specified anxiety disorders: Secondary | ICD-10-CM

## 2020-12-18 NOTE — Telephone Encounter (Signed)
Requested medications are due for refill today yes  Requested medications are on the active medication list yes  Last refill 3/8  Last visit 11/24/20  Future visit scheduled no  Notes to clinic Has refills on original rx but it was only for 60 which would only be for two weeks at current dose. Please assess.

## 2020-12-21 NOTE — Telephone Encounter (Signed)
Please advise have sent prescription for 15mg  tablets so he only has to take 1 tablet twice a day.

## 2020-12-22 NOTE — Telephone Encounter (Signed)
I called pt and pt verbalized understanding of information below.

## 2022-02-21 ENCOUNTER — Ambulatory Visit (INDEPENDENT_AMBULATORY_CARE_PROVIDER_SITE_OTHER): Payer: Self-pay | Admitting: Family Medicine

## 2022-02-21 ENCOUNTER — Encounter: Payer: Self-pay | Admitting: Family Medicine

## 2022-02-21 VITALS — BP 135/91 | HR 88 | Temp 98.1°F | Resp 16 | Ht 72.0 in | Wt 187.7 lb

## 2022-02-21 DIAGNOSIS — H6192 Disorder of left external ear, unspecified: Secondary | ICD-10-CM

## 2022-02-21 NOTE — Progress Notes (Unsigned)
     I,Jana Robinson,acting as a scribe for Lelon Huh, MD.,have documented all relevant documentation on the behalf of Lelon Huh, MD,as directed by  Lelon Huh, MD while in the presence of Lelon Huh, MD.   Established patient visit   Patient: Stuart Harper   DOB: [birth year]   26 y.o. Male  MRN: [id number] Visit Date: 02/21/2022  Today's healthcare provider: Lelon Huh, MD   Chief Complaint  Patient presents with   Skin Problem   Subjective    Patient is a 26 yr old male presenting for left ear wart-like growth. Reports it's been there x 2 yrs. History of warts on hands and feet.  No pain or discomfort.  Would like removed.    Requesting tetanus today.   Medications: Outpatient Medications Prior to Visit  Medication Sig   fluticasone (FLONASE) 50 MCG/ACT nasal spray Place 2 sprays into both nostrils daily.   busPIRone (BUSPAR) 15 MG tablet Take 1 tablet (15 mg total) by mouth 2 (two) times daily.   cetirizine (ZYRTEC) 5 MG tablet Take 10 mg by mouth daily.   fluvoxaMINE (LUVOX) 100 MG tablet Take 1 tablet (100 mg total) by mouth at bedtime. (Patient taking differently: Take 50 mg by mouth at bedtime.)   ondansetron (ZOFRAN) 4 MG tablet Take 1 tablet (4 mg total) by mouth every 8 (eight) hours as needed for nausea or vomiting.   QUEtiapine (SEROQUEL) 100 MG tablet Take 1 tablet (100 mg total) by mouth at bedtime.   valACYclovir (VALTREX) 1000 MG tablet TAKE 2 TABLETS BY MOUTH TWICE A DAY AS NEEDED (Patient not taking: Reported on 02/21/2022)   No facility-administered medications prior to visit.    Review of Systems  {Labs  Heme  Chem  Endocrine  Serology  Results Review (optional):23779}   Objective    BP (!) 135/91 (BP Location: Right Arm, Patient Position: Sitting, Cuff Size: Normal)   Pulse 88   Temp 98.1 F (36.7 C) (Oral)   Resp 16   Ht 6' (1.829 m)   Wt 187 lb 11.2 oz (85.1 kg)   SpO2 99%   BMI 25.46 kg/m  {Show previous vital  signs (optional):23777}  Physical Exam  ***  No results found for any visits on 02/21/22.  Assessment & Plan     ***  No follow-ups on file.      {provider attestation***:1}   Lelon Huh, MD  [facility] [phone] (phone) [phone] (fax)  [place]

## 2022-09-05 ENCOUNTER — Ambulatory Visit: Payer: Medicaid Other | Admitting: Internal Medicine

## 2022-09-05 ENCOUNTER — Encounter: Payer: Self-pay | Admitting: Internal Medicine

## 2022-09-05 VITALS — BP 142/78 | HR 81 | Temp 98.3°F | Ht 72.0 in | Wt 184.0 lb

## 2022-09-05 DIAGNOSIS — B079 Viral wart, unspecified: Secondary | ICD-10-CM

## 2022-09-05 DIAGNOSIS — G4733 Obstructive sleep apnea (adult) (pediatric): Secondary | ICD-10-CM | POA: Diagnosis not present

## 2022-09-05 DIAGNOSIS — F329 Major depressive disorder, single episode, unspecified: Secondary | ICD-10-CM | POA: Diagnosis not present

## 2022-09-05 DIAGNOSIS — F410 Panic disorder [episodic paroxysmal anxiety] without agoraphobia: Secondary | ICD-10-CM

## 2022-09-05 DIAGNOSIS — F1994 Other psychoactive substance use, unspecified with psychoactive substance-induced mood disorder: Secondary | ICD-10-CM | POA: Insufficient documentation

## 2022-09-05 DIAGNOSIS — J4540 Moderate persistent asthma, uncomplicated: Secondary | ICD-10-CM | POA: Diagnosis not present

## 2022-09-05 HISTORY — DX: Viral wart, unspecified: B07.9

## 2022-09-05 NOTE — Assessment & Plan Note (Signed)
Patient has small wart in the left ear we will send the patient to dermatologist.

## 2022-09-05 NOTE — Progress Notes (Signed)
Established Patient Office Visit  Subjective:  Patient ID: Stuart Harper, male    DOB: 1996-03-09  Age: 26 y.o. MRN: [id number]  CC:  Chief Complaint  Patient presents with   Establish Care    Concerned of HTN    HPI  Stuart Harper presents for lesion  lt ear  Past Medical History:  Diagnosis Date   Allergy    Anxiety    Asthma    Seasonal allergies     Past Surgical History:  Procedure Laterality Date   COLONOSCOPY WITH PROPOFOL N/A 05/26/2017   Procedure: COLONOSCOPY WITH PROPOFOL;  Surgeon: [name], MD;  Location: [facility] ENDOSCOPY;  Service: Gastroenterology;  Laterality: N/A;   ESOPHAGOGASTRODUODENOSCOPY (EGD) WITH PROPOFOL N/A 05/26/2017   Procedure: ESOPHAGOGASTRODUODENOSCOPY (EGD) WITH PROPOFOL;  Surgeon: [name], MD;  Location: [facility] ENDOSCOPY;  Service: Gastroenterology;  Laterality: N/A;   None      Family History  Problem Relation Age of Onset   Allergies Mother    Asthma Mother    Migraines Mother    Hypertension Father    Allergies Father    Osteoarthritis Father    Depression Father    Lung cancer Maternal Grandfather    Bipolar disorder Paternal Grandmother    Alzheimer's disease Paternal Grandfather    Rheum arthritis Paternal Uncle    Asthma Paternal Uncle    CAD Other     Social History   Socioeconomic History   Marital status: Single    Spouse name: Not on file   Number of children: 0   Years of education: Not on file   Highest education level: Associate degree: occupational, Hotel manager, or vocational program  Occupational History   Not on file  Tobacco Use   Smoking status: Never   Smokeless tobacco: Never  Vaping Use   Vaping Use: Never used  Substance and Sexual Activity   Alcohol use: No   Drug use: Not Currently    Frequency: 14.0 times per week    Types: Marijuana    Comment: used everyday   Sexual activity: Not Currently  Other Topics Concern   Not on file  Social History Narrative    Lives with parents in 2 story home.     Student at [employer], sophomore. 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            Social Determinants of Health   Financial Resource Strain: Low Risk  (10/19/2017)   Overall Financial Resource Strain (CARDIA)    Difficulty of Paying Living Expenses: Not hard at all  Food Insecurity: No Food Insecurity (10/19/2017)   Hunger Vital Sign    Worried About Running Out of Food in the Last Year: Never true    Ran Out of Food in the Last Year: Never true  Transportation Needs: No Transportation Needs (10/19/2017)   PRAPARE - Hydrologist (Medical): No    Lack of Transportation (Non-Medical): No  Physical Activity: Inactive (10/19/2017)   Exercise Vital Sign    Days of Exercise per Week: 0 days    Minutes of Exercise per Session: 0 min  Stress: Stress Concern Present (10/19/2017)   [place]    Feeling of Stress : Very much  Social Connections: Unknown (10/19/2017)   Social Connection and Isolation Panel [NHANES]    Frequency of Communication with Friends and Family:  Not on file    Frequency of Social Gatherings with Friends and Family: Not on file    Attends Religious Services: Never    Active Member of Clubs or Organizations: No    Attends Archivist Meetings: Never    Marital Status: Never married  Intimate Partner Violence: Not At Risk (10/19/2017)   Humiliation, Afraid, Rape, and Kick questionnaire    Fear of Current or Ex-Partner: No    Emotionally Abused: No    Physically Abused: No    Sexually Abused: No     Current Outpatient Medications:    fluticasone (FLONASE) 50 MCG/ACT nasal spray, Place 2 sprays into both nostrils daily., Disp: , Rfl:    Allergies  Allergen Reactions   Shellfish Allergy Rash    ROS Review of Systems  Constitutional: Negative.   HENT: Negative.    Eyes:  Negative.   Respiratory: Negative.    Cardiovascular: Negative.   Gastrointestinal: Negative.   Endocrine: Negative.   Genitourinary: Negative.   Musculoskeletal: Negative.   Skin: Negative.   Allergic/Immunologic: Negative.   Neurological: Negative.   Hematological: Negative.   Psychiatric/Behavioral: Negative.    All other systems reviewed and are negative.     Objective:    Physical Exam Vitals reviewed.  Constitutional:      Appearance: Normal appearance.  HENT:     Mouth/Throat:     Mouth: Mucous membranes are moist.  Eyes:     Pupils: Pupils are equal, round, and reactive to light.  Neck:     Vascular: No carotid bruit.  Cardiovascular:     Rate and Rhythm: Normal rate and regular rhythm.     Pulses: Normal pulses.     Heart sounds: Normal heart sounds.  Pulmonary:     Effort: Pulmonary effort is normal.     Breath sounds: Normal breath sounds.  Abdominal:     General: Bowel sounds are normal.     Palpations: Abdomen is soft. There is no hepatomegaly, splenomegaly or mass.     Tenderness: There is no abdominal tenderness.     Hernia: No hernia is present.  Musculoskeletal:     Cervical back: Neck supple.     Right lower leg: No edema.     Left lower leg: No edema.  Skin:    Findings: No rash.  Neurological:     Mental Status: He is alert and oriented to person, place, and time.     Motor: No weakness.  Psychiatric:        Mood and Affect: Mood normal.        Behavior: Behavior normal.     BP (!) 142/78   Pulse 81   Temp 98.3 F (36.8 C)   Ht 6' (1.829 m)   Wt 184 lb (83.5 kg)   SpO2 99%   BMI 24.95 kg/m  Wt Readings from Last 3 Encounters:  09/05/22 184 lb (83.5 kg)  02/21/22 187 lb 11.2 oz (85.1 kg)  01/06/20 173 lb 12.8 oz (78.8 kg)     Health Maintenance Due  Topic Date Due   COVID-19 Vaccine (1) Never done   DTaP/Tdap/Td (7 - Td or Tdap) 02/27/2017   INFLUENZA VACCINE  04/19/2022    There are no preventive care reminders to  display for this patient.  Lab Results  Component Value Date   TSH 0.345 (L) 11/15/2019   Lab Results  Component Value Date   WBC 3.7 11/15/2019   HGB 15.8 11/15/2019   HCT  44.7 11/15/2019   MCV 85 11/15/2019   PLT 159 11/15/2019   Lab Results  Component Value Date   NA 140 11/15/2019   K 4.2 11/15/2019   CO2 24 11/15/2019   GLUCOSE 93 11/15/2019   BUN 12 11/15/2019   CREATININE 0.84 11/15/2019   BILITOT 0.2 11/15/2019   ALKPHOS 61 11/15/2019   AST 18 11/15/2019   ALT 18 11/15/2019   PROT 6.6 11/15/2019   ALBUMIN 4.4 11/15/2019   CALCIUM 8.6 (L) 11/15/2019   ANIONGAP 11 10/16/2017   GFR 120.68 07/14/2014   Lab Results  Component Value Date   CHOL 126 10/20/2017   Lab Results  Component Value Date   HDL 41 10/20/2017   Lab Results  Component Value Date   LDLCALC 73 10/20/2017   Lab Results  Component Value Date   TRIG 60 10/20/2017   Lab Results  Component Value Date   CHOLHDL 3.1 10/20/2017   Lab Results  Component Value Date   HGBA1C 5.1 10/20/2017      Assessment & Plan:   Problem List Items Addressed This Visit       Respiratory   Asthma   OSA (obstructive sleep apnea)     Musculoskeletal and Integument   Wart of face    Patient has small wart in the left ear we will send the patient to dermatologist.        Other   Panic disorder    Patient has a problem with anxiety with depression has seen a psychiatrist in the past.  But has not since lately anybody because of insurance problem, I will send him to a psychiatrist for further evaluation.      Reactive depression - Primary   Relevant Orders   Ambulatory referral to Psychology   Ambulatory referral to Dentistry    No orders of the defined types were placed in this encounter.   Follow-up: No follow-ups on file.    Cletis Athens, MD

## 2022-09-05 NOTE — Assessment & Plan Note (Signed)
Patient has a problem with anxiety with depression has seen a psychiatrist in the past.  But has not since lately anybody because of insurance problem, I will send him to a psychiatrist for further evaluation.

## 2022-09-20 ENCOUNTER — Telehealth: Payer: Self-pay

## 2022-09-20 ENCOUNTER — Other Ambulatory Visit: Payer: Self-pay

## 2022-09-20 DIAGNOSIS — B079 Viral wart, unspecified: Secondary | ICD-10-CM

## 2022-09-20 DIAGNOSIS — F329 Major depressive disorder, single episode, unspecified: Secondary | ICD-10-CM

## 2022-09-20 NOTE — Telephone Encounter (Signed)
Derm referral placed. Psych was denied so a new referral has been placed. Patient is aware.

## 2022-09-20 NOTE — Telephone Encounter (Signed)
Patient states he was supposed to receive a referral, for Dermatology and Psych. Please advise.

## 2022-09-26 ENCOUNTER — Ambulatory Visit: Payer: Medicaid Other | Admitting: Internal Medicine

## 2022-09-26 ENCOUNTER — Encounter: Payer: Self-pay | Admitting: Internal Medicine

## 2022-09-26 VITALS — BP 138/90 | HR 94 | Ht 72.0 in | Wt 182.0 lb

## 2022-09-26 DIAGNOSIS — F329 Major depressive disorder, single episode, unspecified: Secondary | ICD-10-CM

## 2022-09-26 DIAGNOSIS — T7840XD Allergy, unspecified, subsequent encounter: Secondary | ICD-10-CM

## 2022-09-26 DIAGNOSIS — F1220 Cannabis dependence, uncomplicated: Secondary | ICD-10-CM

## 2022-09-26 DIAGNOSIS — F331 Major depressive disorder, recurrent, moderate: Secondary | ICD-10-CM

## 2022-09-26 MED ORDER — CITALOPRAM HYDROBROMIDE 10 MG PO TABS: 10.0000 mg | ORAL_TABLET | Freq: Every day | ORAL | 3 refills | Status: DC

## 2022-09-26 MED ORDER — FLUTICASONE PROPIONATE 50 MCG/ACT NA SUSP: 2.0000 | Freq: Every day | NASAL | 1 refills | Status: DC

## 2022-09-26 NOTE — Progress Notes (Signed)
Established Patient Office Visit  Subjective:  Patient ID: Stuart Harper, male    DOB: Oct 10, 1995  Age: 27 y.o. MRN: [id number]  CC:  Chief Complaint  Patient presents with   Follow-up   Depression    Seroquel has helped in the past    Depression         Stuart Harper presents for check up  Past Medical History:  Diagnosis Date   Allergy    Anxiety    Asthma    Seasonal allergies     Past Surgical History:  Procedure Laterality Date   COLONOSCOPY WITH PROPOFOL N/A 05/26/2017   Procedure: COLONOSCOPY WITH PROPOFOL;  Surgeon: [name], MD;  Location: [facility] ENDOSCOPY;  Service: Gastroenterology;  Laterality: N/A;   ESOPHAGOGASTRODUODENOSCOPY (EGD) WITH PROPOFOL N/A 05/26/2017   Procedure: ESOPHAGOGASTRODUODENOSCOPY (EGD) WITH PROPOFOL;  Surgeon: [name], MD;  Location: [facility] ENDOSCOPY;  Service: Gastroenterology;  Laterality: N/A;   None      Family History  Problem Relation Age of Onset   Allergies Mother    Asthma Mother    Migraines Mother    Hypertension Father    Allergies Father    Osteoarthritis Father    Depression Father    Lung cancer Maternal Grandfather    Bipolar disorder Paternal Grandmother    Alzheimer's disease Paternal Grandfather    Rheum arthritis Paternal Uncle    Asthma Paternal Uncle    CAD Other     Social History   Socioeconomic History   Marital status: Single    Spouse name: Not on file   Number of children: 0   Years of education: Not on file   Highest education level: Associate degree: occupational, Hotel manager, or vocational program  Occupational History   Not on file  Tobacco Use   Smoking status: Never   Smokeless tobacco: Never  Vaping Use   Vaping Use: Never used  Substance and Sexual Activity   Alcohol use: No   Drug use: Not Currently    Frequency: 14.0 times per week    Types: Marijuana    Comment: used everyday   Sexual activity: Not Currently  Other Topics Concern   Not on file   Social History Narrative   Lives with parents in 2 story home.     Student at [employer], sophomore. 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            Social Determinants of Health   Financial Resource Strain: Low Risk  (10/19/2017)   Overall Financial Resource Strain (CARDIA)    Difficulty of Paying Living Expenses: Not hard at all  Food Insecurity: No Food Insecurity (10/19/2017)   Hunger Vital Sign    Worried About Running Out of Food in the Last Year: Never true    Ran Out of Food in the Last Year: Never true  Transportation Needs: No Transportation Needs (10/19/2017)   PRAPARE - Hydrologist (Medical): No    Lack of Transportation (Non-Medical): No  Physical Activity: Inactive (10/19/2017)   Exercise Vital Sign    Days of Exercise per Week: 0 days    Minutes of Exercise per Session: 0 min  Stress: Stress Concern Present (10/19/2017)   [place]    Feeling of Stress : Very much  Social Connections: Unknown (10/19/2017)   Social Connection and Isolation Panel [NHANES]  Frequency of Communication with Friends and Family: Not on file    Frequency of Social Gatherings with Friends and Family: Not on file    Attends Religious Services: Never    Active Member of Clubs or Organizations: No    Attends Archivist Meetings: Never    Marital Status: Never married  Intimate Partner Violence: Not At Risk (10/19/2017)   Humiliation, Afraid, Rape, and Kick questionnaire    Fear of Current or Ex-Partner: No    Emotionally Abused: No    Physically Abused: No    Sexually Abused: No     Current Outpatient Medications:    fluticasone (FLONASE) 50 MCG/ACT nasal spray, Place 2 sprays into both nostrils daily., Disp: , Rfl:    Allergies  Allergen Reactions   Shellfish Allergy Rash    ROS Review of Systems  Constitutional: Negative.    HENT: Negative.    Eyes: Negative.   Respiratory: Negative.    Cardiovascular: Negative.   Gastrointestinal: Negative.   Endocrine: Negative.   Genitourinary: Negative.   Musculoskeletal: Negative.   Skin: Negative.   Allergic/Immunologic: Negative.   Neurological: Negative.   Hematological: Negative.   Psychiatric/Behavioral:  Positive for depression.   All other systems reviewed and are negative.     Objective:    Physical Exam Vitals reviewed.  Constitutional:      Appearance: Normal appearance.  HENT:     Mouth/Throat:     Mouth: Mucous membranes are moist.  Eyes:     Pupils: Pupils are equal, round, and reactive to light.  Neck:     Vascular: No carotid bruit.  Cardiovascular:     Rate and Rhythm: Normal rate and regular rhythm.     Pulses: Normal pulses.     Heart sounds: Normal heart sounds.  Pulmonary:     Effort: Pulmonary effort is normal.     Breath sounds: Normal breath sounds.  Abdominal:     General: Bowel sounds are normal.     Palpations: Abdomen is soft. There is no hepatomegaly, splenomegaly or mass.     Tenderness: There is no abdominal tenderness.     Hernia: No hernia is present.  Musculoskeletal:     Cervical back: Neck supple.     Right lower leg: No edema.     Left lower leg: No edema.  Skin:    Findings: No rash.  Neurological:     Mental Status: He is alert and oriented to person, place, and time.     Motor: No weakness.  Psychiatric:        Mood and Affect: Mood normal.        Behavior: Behavior normal.     BP (!) 138/90   Pulse 94   Ht 6' (1.829 m)   Wt 182 lb (82.6 kg)   SpO2 99%   BMI 24.68 kg/m  Wt Readings from Last 3 Encounters:  09/26/22 182 lb (82.6 kg)  09/05/22 184 lb (83.5 kg)  02/21/22 187 lb 11.2 oz (85.1 kg)     Health Maintenance Due  Topic Date Due   COVID-19 Vaccine (1) Never done   DTaP/Tdap/Td (7 - Td or Tdap) 02/27/2017   INFLUENZA VACCINE  04/19/2022    There are no preventive care  reminders to display for this patient.  Lab Results  Component Value Date   TSH 0.345 (L) 11/15/2019   Lab Results  Component Value Date   WBC 3.7 11/15/2019   HGB 15.8 11/15/2019   HCT  44.7 11/15/2019   MCV 85 11/15/2019   PLT 159 11/15/2019   Lab Results  Component Value Date   NA 140 11/15/2019   K 4.2 11/15/2019   CO2 24 11/15/2019   GLUCOSE 93 11/15/2019   BUN 12 11/15/2019   CREATININE 0.84 11/15/2019   BILITOT 0.2 11/15/2019   ALKPHOS 61 11/15/2019   AST 18 11/15/2019   ALT 18 11/15/2019   PROT 6.6 11/15/2019   ALBUMIN 4.4 11/15/2019   CALCIUM 8.6 (L) 11/15/2019   ANIONGAP 11 10/16/2017   GFR 120.68 07/14/2014   Lab Results  Component Value Date   CHOL 126 10/20/2017   Lab Results  Component Value Date   HDL 41 10/20/2017   Lab Results  Component Value Date   LDLCALC 73 10/20/2017   Lab Results  Component Value Date   TRIG 60 10/20/2017   Lab Results  Component Value Date   CHOLHDL 3.1 10/20/2017   Lab Results  Component Value Date   HGBA1C 5.1 10/20/2017      Assessment & Plan:   Problem List Items Addressed This Visit       Other   Reactive depression - Primary    - Patient experiencing high levels of anxiety.  - Encouraged patient to engage in relaxing activities like yoga, meditation, journaling, going for a walk, or participating in a hobby.  - Encouraged patient to reach out to trusted friends or family members about recent struggles, Patient was advised to read A book, how to stop worrying and start living, it is good book to read to control  the stress       Other Visit Diagnoses     Allergy, subsequent encounter       Cannabis dependence, uncomplicated ([place])   (Chronic)     Moderate episode of recurrent major depressive disorder ([place])   (Chronic)         No orders of the defined types were placed in this encounter.   Follow-up: No follow-ups on file.    Cletis Athens, MD

## 2022-09-26 NOTE — Assessment & Plan Note (Signed)
-   Patient experiencing high levels of anxiety.  - Encouraged patient to engage in relaxing activities like yoga, meditation, journaling, going for a walk, or participating in a hobby.  - Encouraged patient to reach out to trusted friends or family members about recent struggles, Patient was advised to read A book, how to stop worrying and start living, it is good book to read to control  the stress  

## 2022-10-10 ENCOUNTER — Ambulatory Visit (INDEPENDENT_AMBULATORY_CARE_PROVIDER_SITE_OTHER): Payer: Medicaid Other | Admitting: Dermatology

## 2022-10-10 DIAGNOSIS — B079 Viral wart, unspecified: Secondary | ICD-10-CM | POA: Diagnosis not present

## 2022-10-10 DIAGNOSIS — D485 Neoplasm of uncertain behavior of skin: Secondary | ICD-10-CM

## 2022-10-10 DIAGNOSIS — Z7189 Other specified counseling: Secondary | ICD-10-CM

## 2022-10-10 NOTE — Progress Notes (Signed)
   New Patient Visit  Subjective  Stuart Harper is a 27 y.o. male who presents for the following: Irregular skin lesion (L ear x 3 years - patient concerned it may be a wart, he does pick at it. Hx of warts on the hands and feet as a child).  The following portions of the chart were reviewed this encounter and updated as appropriate:   Tobacco  Allergies  Meds  Problems  Med Hx  Surg Hx  Fam Hx     Review of Systems:  No other skin or systemic complaints except as noted in HPI or Assessment and Plan.  Objective  Well appearing patient in no apparent distress; mood and affect are within normal limits.  A focused examination was performed including the face and L ear. Relevant physical exam findings are noted in the Assessment and Plan.  Left Ear 0.7 cm verrucous papule.        Assessment & Plan  Neoplasm of uncertain behavior of skin Left Ear Epidermal / dermal shaving  Lesion diameter (cm):  0.7 Informed consent: discussed and consent obtained   Timeout: patient name, date of birth, surgical site, and procedure verified   Procedure prep:  Patient was prepped and draped in usual sterile fashion Prep type:  Isopropyl alcohol Anesthesia: the lesion was anesthetized in a standard fashion   Anesthetic:  1% lidocaine w/ epinephrine 1-100,000 buffered w/ 8.4% NaHCO3 Instrument used: flexible razor blade   Hemostasis achieved with: pressure, aluminum chloride and electrodesiccation   Outcome: patient tolerated procedure well   Post-procedure details: sterile dressing applied and wound care instructions given   Dressing type: bandage and petrolatum    Specimen 1 - Surgical pathology Differential Diagnosis: D48.5 r/o wart vs other  Check Margins: No  Viral Wart (HPV) Counseling  Discussed viral / HPV (Human Papilloma Virus) etiology and risk of spread /infectivity to other areas of body as well as to other people.  Multiple treatments and methods may be  required to clear warts and it is possible treatment may not be successful.  Treatment risks include discoloration; scarring and there is still potential for wart recurrence.  Return if symptoms worsen or fail to improve.  Stuart Harper, CMA, am acting as scribe for Sarina Ser, MD . Documentation: I have reviewed the above documentation for accuracy and completeness, and I agree with the above.  Sarina Ser, MD

## 2022-10-10 NOTE — Patient Instructions (Addendum)
Wound Care Instructions  Cleanse wound gently with soap and water once a day then pat dry with clean gauze. Apply a thin coat of Petrolatum (petroleum jelly, "Vaseline") over the wound (unless you have an allergy to this). We recommend that you use a new, sterile tube of Vaseline. Do not pick or remove scabs. Do not remove the yellow or white "healing tissue" from the base of the wound.  Cover the wound with fresh, clean, nonstick gauze and secure with paper tape. You may use Band-Aids in place of gauze and tape if the wound is small enough, but would recommend trimming much of the tape off as there is often too much. Sometimes Band-Aids can irritate the skin.  You should call the office for your biopsy report after 1 week if you have not already been contacted.  If you experience any problems, such as abnormal amounts of bleeding, swelling, significant bruising, significant pain, or evidence of infection, please call the office immediately.  FOR ADULT SURGERY PATIENTS: If you need something for pain relief you may take 1 extra strength Tylenol (acetaminophen) AND 2 Ibuprofen (200mg each) together every 4 hours as needed for pain. (do not take these if you are allergic to them or if you have a reason you should not take them.) Typically, you may only need pain medication for 1 to 3 days.     Due to recent changes in healthcare laws, you may see results of your pathology and/or laboratory studies on MyChart before the doctors have had a chance to review them. We understand that in some cases there may be results that are confusing or concerning to you. Please understand that not all results are received at the same time and often the doctors may need to interpret multiple results in order to provide you with the best plan of care or course of treatment. Therefore, we ask that you please give us 2 business days to thoroughly review all your results before contacting the office for clarification. Should  we see a critical lab result, you will be contacted sooner.   If You Need Anything After Your Visit  If you have any questions or concerns for your doctor, please call our main line at [phone] and press option 4 to reach your doctor's medical assistant. If no one answers, please leave a voicemail as directed and we will return your call as soon as possible. Messages left after 4 pm will be answered the following business day.   You may also send us a message via MyChart. We typically respond to MyChart messages within 1-2 business days.  For prescription refills, please ask your pharmacy to contact our office. Our fax number is [phone].  If you have an urgent issue when the clinic is closed that cannot wait until the next business day, you can page your doctor at the number below.    Please note that while we do our best to be available for urgent issues outside of office hours, we are not available 24/7.   If you have an urgent issue and are unable to reach us, you may choose to seek medical care at your doctor's office, retail clinic, urgent care center, or emergency room.  If you have a medical emergency, please immediately call 911 or go to the emergency department.  Pager Numbers  - Dr. Kowalski: [phone]  - Dr. Moye: [phone]  - Dr. Stewart: [phone]  In the event of inclement weather, please call our main line at   [phone] for an update on the status of any delays or closures.  Dermatology Medication Tips: Please keep the boxes that topical medications come in in order to help keep track of the instructions about where and how to use these. Pharmacies typically print the medication instructions only on the boxes and not directly on the medication tubes.   If your medication is too expensive, please contact our office at [phone] option 4 or send us a message through MyChart.   We are unable to tell what your co-pay for medications will be in  advance as this is different depending on your insurance coverage. However, we may be able to find a substitute medication at lower cost or fill out paperwork to get insurance to cover a needed medication.   If a prior authorization is required to get your medication covered by your insurance company, please allow us 1-2 business days to complete this process.  Drug prices often vary depending on where the prescription is filled and some pharmacies may offer cheaper prices.  The website www.goodrx.com contains coupons for medications through different pharmacies. The prices here do not account for what the cost may be with help from insurance (it may be cheaper with your insurance), but the website can give you the price if you did not use any insurance.  - You can print the associated coupon and take it with your prescription to the pharmacy.  - You may also stop by our office during regular business hours and pick up a GoodRx coupon card.  - If you need your prescription sent electronically to a different pharmacy, notify our office through [place] MyChart or by phone at [phone] option 4.     Si Usted Necesita Algo Despus de Su Visita  Tambin puede enviarnos un mensaje a travs de MyChart. Por lo general respondemos a los mensajes de MyChart en el transcurso de 1 a 2 das hbiles.  Para renovar recetas, por favor pida a su farmacia que se ponga en contacto con nuestra oficina. Nuestro nmero de fax es el [phone].  Si tiene un asunto urgente cuando la clnica est cerrada y que no puede esperar hasta el siguiente da hbil, puede llamar/localizar a su doctor(a) al nmero que aparece a continuacin.   Por favor, tenga en cuenta que aunque hacemos todo lo posible para estar disponibles para asuntos urgentes fuera del horario de oficina, no estamos disponibles las 24 horas del da, los 7 das de la semana.   Si tiene un problema urgente y no puede comunicarse con nosotros, puede  optar por buscar atencin mdica  en el consultorio de su doctor(a), en una clnica privada, en un centro de atencin urgente o en una sala de emergencias.  Si tiene una emergencia mdica, por favor llame inmediatamente al 911 o vaya a la sala de emergencias.  Nmeros de bper  - Dr. Kowalski: [phone]  - Dra. Moye: [phone]  - Dra. Stewart: [phone]  En caso de inclemencias del tiempo, por favor llame a nuestra lnea principal al [phone] para una actualizacin sobre el estado de cualquier retraso o cierre.  Consejos para la medicacin en dermatologa: Por favor, guarde las cajas en las que vienen los medicamentos de uso tpico para ayudarle a seguir las instrucciones sobre dnde y cmo usarlos. Las farmacias generalmente imprimen las instrucciones del medicamento slo en las cajas y no directamente en los tubos del medicamento.   Si su medicamento es muy caro, por favor, pngase en contacto con   nuestra oficina llamando al [phone] y presione la opcin 4 o envenos un mensaje a travs de MyChart.   No podemos decirle cul ser su copago por los medicamentos por adelantado ya que esto es diferente dependiendo de la cobertura de su seguro. Sin embargo, es posible que podamos encontrar un medicamento sustituto a menor costo o llenar un formulario para que el seguro cubra el medicamento que se considera necesario.   Si se requiere una autorizacin previa para que su compaa de seguros cubra su medicamento, por favor permtanos de 1 a 2 das hbiles para completar este proceso.  Los precios de los medicamentos varan con frecuencia dependiendo del lugar de dnde se surte la receta y alguna farmacias pueden ofrecer precios ms baratos.  El sitio web www.goodrx.com tiene cupones para medicamentos de diferentes farmacias. Los precios aqu no tienen en cuenta lo que podra costar con la ayuda del seguro (puede ser ms barato con su seguro), pero el sitio web puede darle el  precio si no utiliz ningn seguro.  - Puede imprimir el cupn correspondiente y llevarlo con su receta a la farmacia.  - Tambin puede pasar por nuestra oficina durante el horario de atencin regular y recoger una tarjeta de cupones de GoodRx.  - Si necesita que su receta se enve electrnicamente a una farmacia diferente, informe a nuestra oficina a travs de MyChart de [place] o por telfono llamando al [phone] y presione la opcin 4.  

## 2022-10-13 ENCOUNTER — Telehealth: Payer: Self-pay

## 2022-10-13 NOTE — Telephone Encounter (Signed)
Left pt msg to call for bx result/sh °

## 2022-10-13 NOTE — Telephone Encounter (Signed)
-----  Message from Ralene Bathe, MD sent at 10/12/2022  6:45 PM EST ----- Diagnosis Skin , left ear VERRUCA VULGARIS, IRRITATED  Benign irritated viral wart May recur No further treatment needed unless recurs.

## 2022-10-17 ENCOUNTER — Ambulatory Visit (INDEPENDENT_AMBULATORY_CARE_PROVIDER_SITE_OTHER): Payer: Medicaid Other | Admitting: Dermatology

## 2022-10-17 ENCOUNTER — Telehealth: Payer: Self-pay

## 2022-10-17 ENCOUNTER — Encounter: Payer: Self-pay | Admitting: Dermatology

## 2022-10-17 VITALS — BP 126/89 | HR 89

## 2022-10-17 DIAGNOSIS — T148XXA Other injury of unspecified body region, initial encounter: Secondary | ICD-10-CM

## 2022-10-17 DIAGNOSIS — L03811 Cellulitis of head [any part, except face]: Secondary | ICD-10-CM | POA: Diagnosis not present

## 2022-10-17 DIAGNOSIS — S01302A Unspecified open wound of left ear, initial encounter: Secondary | ICD-10-CM

## 2022-10-17 MED ORDER — DOXYCYCLINE MONOHYDRATE 100 MG PO CAPS: 100.0000 mg | ORAL_CAPSULE | Freq: Two times a day (BID) | ORAL | 0 refills | Status: DC

## 2022-10-17 NOTE — Telephone Encounter (Signed)
Patient left message today at 9:37 am that the area at ear where biopsy was done is swollen and painful and he is concerned it could be infected. I tried to call patient to schedule him for 3:15 pm today but had to leave message.   Lurlean Horns., RMA

## 2022-10-17 NOTE — Patient Instructions (Addendum)
For open wound   Take doxycycline 100 mg capsule 1 by mouth twice daily with food and drink  Doxycycline should be taken with food to prevent nausea. Do not lay down for 30 minutes after taking. Be cautious with sun exposure and use good sun protection while on this medication. Pregnant women should not take this medication.   After bath or shower apply neosporin or polysporin topically to open wound at left ear daily until healed.         Due to recent changes in healthcare laws, you may see results of your pathology and/or laboratory studies on MyChart before the doctors have had a chance to review them. We understand that in some cases there may be results that are confusing or concerning to you. Please understand that not all results are received at the same time and often the doctors may need to interpret multiple results in order to provide you with the best plan of care or course of treatment. Therefore, we ask that you please give Stuart Harper 2 business days to thoroughly review all your results before contacting the office for clarification. Should we see a critical lab result, you will be contacted sooner.   If You Need Anything After Your Visit  If you have any questions or concerns for your doctor, please call our main line at [phone] and press option 4 to reach your doctor's medical assistant. If no one answers, please leave a voicemail as directed and we will return your call as soon as possible. Messages left after 4 pm will be answered the following business day.   You may also send Stuart Harper a message via [place]. We typically respond to MyChart messages within 1-2 business days.  For prescription refills, please ask your pharmacy to contact our office. Our fax number is [phone].  If you have an urgent issue when the clinic is closed that cannot wait until the next business day, you can page your doctor at the number below.    Please note that while we do our best to be  available for urgent issues outside of office hours, we are not available 24/7.   If you have an urgent issue and are unable to reach Stuart Harper, you may choose to seek medical care at your doctor's office, retail clinic, urgent care center, or emergency room.  If you have a medical emergency, please immediately call 911 or go to the emergency department.  Pager Numbers  - Dr. Nehemiah Massed: [phone]  - Dr. Laurence Ferrari: [phone]  - Dr. Nicole Kindred: [phone]  In the event of inclement weather, please call our main line at [phone] for an update on the status of any delays or closures.  Dermatology Medication Tips: Please keep the boxes that topical medications come in in order to help keep track of the instructions about where and how to use these. Pharmacies typically print the medication instructions only on the boxes and not directly on the medication tubes.   If your medication is too expensive, please contact our office at [phone] option 4 or send Stuart Harper a message through [place].   We are unable to tell what your co-pay for medications will be in advance as this is different depending on your insurance coverage. However, we may be able to find a substitute medication at lower cost or fill out paperwork to get insurance to cover a needed medication.   If a prior authorization is required to get your medication covered by your insurance company, please allow Stuart Harper  1-2 business days to complete this process.  Drug prices often vary depending on where the prescription is filled and some pharmacies may offer cheaper prices.  The website www.goodrx.com contains coupons for medications through different pharmacies. The prices here do not account for what the cost may be with help from insurance (it may be cheaper with your insurance), but the website can give you the price if you did not use any insurance.  - You can print the associated coupon and take it with your prescription to the pharmacy.  -  You may also stop by our office during regular business hours and pick up a GoodRx coupon card.  - If you need your prescription sent electronically to a different pharmacy, notify our office through [facility] or by phone at [phone] option 4.     Si Usted Necesita Algo Despus de Su Visita  Tambin puede enviarnos un mensaje a travs de Pharmacist, community. Por lo general respondemos a los mensajes de MyChart en el transcurso de 1 a 2 das hbiles.  Para renovar recetas, por favor pida a su farmacia que se ponga en contacto con nuestra oficina. Harland Dingwall de fax es [place] [phone].  Si tiene un asunto urgente cuando la clnica est cerrada y que no puede esperar hasta el siguiente da hbil, puede llamar/localizar a su doctor(a) al nmero que aparece a continuacin.   Por favor, tenga en cuenta que aunque hacemos todo lo posible para estar disponibles para asuntos urgentes fuera del horario de [place], no estamos disponibles las 24 horas del da, los 7 das de la [place].   Si tiene un problema urgente y no puede comunicarse con nosotros, puede optar por buscar atencin mdica  en el consultorio de su doctor(a), en una clnica privada, en un centro de atencin urgente o en una sala de emergencias.  Si tiene Engineering geologist, por favor llame inmediatamente al 911 o vaya a la sala de emergencias.  Nmeros de bper  - Dr. Nehemiah Massed: [phone]  - Dra. Moye: [phone]  - Dra. Nicole Kindred: [phone]  En caso de inclemencias del [place], por favor llame a Johnsie Kindred principal al [phone] para una actualizacin sobre el [place] de cualquier retraso o cierre.  Consejos para la medicacin en dermatologa: Por favor, guarde las cajas en las que vienen los medicamentos de uso tpico para ayudarle a seguir las instrucciones sobre dnde y cmo usarlos. Las farmacias generalmente imprimen las instrucciones del medicamento slo en las cajas y no directamente en los tubos del  [place].   Si su medicamento es muy caro, por favor, pngase en contacto con Zigmund Daniel llamando al [phone] y presione la opcin 4 o envenos un mensaje a travs de Pharmacist, community.   No podemos decirle cul ser su copago por los medicamentos por adelantado ya que esto es diferente dependiendo de la cobertura de su seguro. Sin embargo, es posible que podamos encontrar un medicamento sustituto a Electrical engineer un formulario para que el seguro cubra el medicamento que se considera necesario.   Si se requiere una autorizacin previa para que su compaa de seguros [place] su medicamento, por favor permtanos de 1 a 2 das hbiles para completar este proceso.  Los precios de los medicamentos varan con frecuencia dependiendo del Environmental consultant de dnde se surte la receta y alguna farmacias pueden ofrecer precios ms baratos.  El sitio web www.goodrx.com tiene cupones para medicamentos de Airline pilot. Los precios aqu no tienen en cuenta lo que podra costar  lo que podra costar con la ayuda del seguro (puede ser ms barato con su seguro), pero el sitio web puede darle el precio si no utiliz ningn seguro.  - Puede imprimir el cupn correspondiente y llevarlo con su receta a la farmacia.  - Tambin puede pasar por nuestra oficina durante el horario de atencin regular y recoger una tarjeta de cupones de GoodRx.  - Si necesita que su receta se enve electrnicamente a una farmacia diferente, informe a nuestra oficina a travs de MyChart de [place] o por telfono llamando al [phone] y presione la opcin 4.  

## 2022-10-17 NOTE — Telephone Encounter (Signed)
-----  Message from Ralene Bathe, MD sent at 10/12/2022  6:45 PM EST ----- Diagnosis Skin , left ear VERRUCA VULGARIS, IRRITATED  Benign irritated viral wart May recur No further treatment needed unless recurs.

## 2022-10-17 NOTE — Progress Notes (Signed)
   Follow-Up Visit   Subjective  Stuart Harper is a 27 y.o. male who presents for the following: Skin Problem (Patient here today concerning left ear. Reports very red, painful, and feels swollen at previous bx site. Would like to make sure not infected. ). The patient has spots, moles and lesions to be evaluated, some may be new or changing and the patient has concerns that these could be cancer.  The following portions of the chart were reviewed this encounter and updated as appropriate:  Tobacco  Allergies  Meds  Problems  Med Hx  Surg Hx  Fam Hx     Review of Systems: No other skin or systemic complaints except as noted in HPI or Assessment and Plan.  Objective  Well appearing patient in no apparent distress; mood and affect are within normal limits.  A focused examination was performed including left ear. Relevant physical exam findings are noted in the Assessment and Plan.  Left Ear Healing open wound   Assessment & Plan  Cellulitis and open wound with soreness and swelling at site of previous procedure Left Ear Start doxycycline 100 mg capsule - 1 capsule by mouth twice daily with food and drink for 1 week.  Doxycycline should be taken with food to prevent nausea. Do not lay down for 30 minutes after taking. Be cautious with sun exposure and use good sun protection while on this medication. Pregnant women should not take this medication.  Apply otc neosporin / polysporin topically to aa after shower daily until healed.   doxycycline (MONODOX) 100 MG capsule - Left Ear Take 1 capsule (100 mg total) by mouth 2 (two) times daily. Take with food and drink. Do not lay down for 30 minutes after taking. Be cautious with sun exposure and use good sun protection while on this medication.  Return if symptoms worsen or fail to improve.  IRuthell Rummage, CMA, am acting as scribe for Sarina Ser, MD. Documentation: I have reviewed the above documentation for  accuracy and completeness, and I agree with the above.  Sarina Ser, MD

## 2022-10-17 NOTE — Telephone Encounter (Signed)
Patient returned our call to go over biopsy results with him. Went over results and patient expressed concern over the site and it being possibly infected. Stated it is red, irritated and painful to touch. Patient requested appointment to come in and be seen by Dr. Nehemiah Massed. Patient scheduled for this afternoon.

## 2022-10-19 ENCOUNTER — Encounter: Payer: Self-pay | Admitting: Dermatology

## 2022-10-21 ENCOUNTER — Encounter: Payer: Self-pay | Admitting: Dermatology

## 2022-12-05 ENCOUNTER — Ambulatory Visit: Payer: Medicaid Other | Admitting: Internal Medicine

## 2022-12-15 ENCOUNTER — Encounter (HOSPITAL_COMMUNITY): Payer: Self-pay | Admitting: Psychiatry

## 2022-12-15 ENCOUNTER — Ambulatory Visit (HOSPITAL_COMMUNITY): Payer: Medicaid Other | Admitting: Psychiatry

## 2022-12-15 DIAGNOSIS — F410 Panic disorder [episodic paroxysmal anxiety] without agoraphobia: Secondary | ICD-10-CM

## 2022-12-15 DIAGNOSIS — F1090 Alcohol use, unspecified, uncomplicated: Secondary | ICD-10-CM | POA: Diagnosis not present

## 2022-12-15 DIAGNOSIS — G4733 Obstructive sleep apnea (adult) (pediatric): Secondary | ICD-10-CM

## 2022-12-15 DIAGNOSIS — F411 Generalized anxiety disorder: Secondary | ICD-10-CM

## 2022-12-15 DIAGNOSIS — F1994 Other psychoactive substance use, unspecified with psychoactive substance-induced mood disorder: Secondary | ICD-10-CM | POA: Diagnosis not present

## 2022-12-15 DIAGNOSIS — F930 Separation anxiety disorder of childhood: Secondary | ICD-10-CM

## 2022-12-15 DIAGNOSIS — F1591 Other stimulant use, unspecified, in remission: Secondary | ICD-10-CM | POA: Diagnosis not present

## 2022-12-15 DIAGNOSIS — F1210 Cannabis abuse, uncomplicated: Secondary | ICD-10-CM | POA: Diagnosis not present

## 2022-12-15 DIAGNOSIS — F1321 Sedative, hypnotic or anxiolytic dependence, in remission: Secondary | ICD-10-CM

## 2022-12-15 DIAGNOSIS — F17290 Nicotine dependence, other tobacco product, uncomplicated: Secondary | ICD-10-CM | POA: Insufficient documentation

## 2022-12-15 DIAGNOSIS — F1621 Hallucinogen dependence, in remission: Secondary | ICD-10-CM | POA: Insufficient documentation

## 2022-12-15 NOTE — Patient Instructions (Addendum)
I will coordinate with your PCP to help you get a referral to [facility] recovery services in [facility]; they should have psychotherapists and bed is in their clinic but you could also reach out to your insurer to find a provider that is in network to begin psychotherapy.  I will also let your PCP to hopefully obtain some vitamin levels and check your thyroid as these can commonly be low.  If he can begin the process of cutting back on the amount of alcohol that she consumes daily, the CDC recommends no more than 2 beers or alcohol units daily and med but based on your symptoms it would be better if you are able to slowly come completely off of it.  Keep up the good work with cutting back on the amount of marijuana that you are using and try to do the same with nicotine.  There is the quit Line like we talked about: http://moreno.org/
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
## 2022-12-20 ENCOUNTER — Ambulatory Visit (INDEPENDENT_AMBULATORY_CARE_PROVIDER_SITE_OTHER): Payer: Medicaid Other | Admitting: Clinical

## 2022-12-20 ENCOUNTER — Encounter (HOSPITAL_COMMUNITY): Payer: Self-pay

## 2022-12-20 DIAGNOSIS — F411 Generalized anxiety disorder: Secondary | ICD-10-CM | POA: Diagnosis not present

## 2022-12-20 DIAGNOSIS — F332 Major depressive disorder, recurrent severe without psychotic features: Secondary | ICD-10-CM | POA: Diagnosis not present

## 2022-12-20 NOTE — Progress Notes (Signed)
Virtual Visit via Video Note  I connected with Stuart Harper on 12/20/22 at  1:00 PM EDT by a video enabled telemedicine application and verified that I am speaking with the correct person using two identifiers.  Location: Patient: Home Provider: Office   I discussed the limitations of evaluation and management by telemedicine and the availability of in person appointments. The patient expressed understanding and agreed to proceed.       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 No data recorded Referral name: No data recorded Referral phone number: No data recorded  Whom do you see for routine medical problems? No data recorded Practice/Facility Name: No data recorded Practice/Facility Phone Number: No data recorded Name of Contact: No data recorded Contact Number: No data recorded Contact Fax Number: No data recorded Prescriber Name: No data recorded Prescriber Address (if known): No data recorded  What Is the Reason for Your Visit/Call Today? No data recorded How Long Has This Been Causing You Problems? No data recorded What Do You Feel Would Help You the Most Today? No data recorded  Have You Recently Been in Any Inpatient Treatment (Hospital/Detox/Crisis Center/28-Day Program)? No data recorded Name/Location of Program/Hospital:No data recorded How Long Were You There? No data recorded When Were You Discharged? No data recorded  Have You Ever Received Services From [facility] Before? No data recorded Who Do You See at [facility]? No data recorded  Have You Recently Had Any Thoughts About Hurting Yourself? No data recorded Are You Planning to Commit  Suicide/Harm Yourself At This time? No data recorded  Have you Recently Had Thoughts About [place]? No data recorded Explanation: No data recorded  Have You Used Any Alcohol or Drugs in the Past 24 Hours? No data recorded How Long Ago Did You Use Drugs or Alcohol? No data recorded What Did You Use and How Much? No data recorded  Do You Currently Have a Therapist/Psychiatrist? No data recorded Name of Therapist/Psychiatrist: No data recorded  Have You Been Recently Discharged From Any Office Practice or Programs? No data recorded Explanation of Discharge From Practice/Program: No data recorded    CCA Screening Triage Referral Assessment Type of Contact: No data recorded Is this Initial or Reassessment? No data recorded Date Telepsych consult ordered in [facility]:  No data recorded Time Telepsych consult ordered in [facility]:  No data recorded  Patient Reported Information Reviewed? No data recorded Patient Left Without Being Seen? No data recorded Reason for Not Completing Assessment: No data recorded  Collateral Involvement: No data recorded  Does Patient Have a [place]? No data recorded Name and Contact of Legal Guardian: No data recorded If Minor and Not Living with Parent(s), Who has Custody? No data recorded Is CPS involved or ever been involved? No data recorded Is APS involved or ever been involved? No data recorded  Patient Determined To Be At Risk for Harm To Self or Others Based on Review of Patient Reported Information or Presenting Complaint? No data recorded Method: No data recorded Availability of Means: No data recorded Intent: No data recorded Notification Required: No data recorded Additional Information for Danger to Others Potential: No data recorded Additional Comments for Danger to Others Potential: No data recorded  Are There Guns or Other Weapons in [place]? No data recorded Types of Guns/Weapons: No data recorded Are These  Weapons Safely Secured?                            No data recorded Who Could Verify You Are Able To Have These Secured: No data recorded Do You Have any Outstanding Charges, Pending Court Dates, Parole/Probation? No data recorded Contacted To Inform of Risk of Harm To Self or Others: No data recorded  Location of Assessment: No data recorded  Does Patient Present under Involuntary Commitment? No data recorded IVC Papers Initial File Date: No data recorded  [place] of Residence: No data recorded  Patient Currently Receiving the Following Services: No data recorded  Determination of Need: No data recorded  Options For Referral: No data recorded    CCA Biopsychosocial Intake/Chief Complaint:  The patient was referred by Dr. Kavin Leech for further assessment for [place] treatment services with indication od  difficulty with Anxiety and Depression  Current Symptoms/Problems: Difficulty with Anxiety and mood   Patient Reported Schizophrenia/Schizoaffective Diagnosis in Past: No data recorded  Strengths: Patient has a college degree, drivers license, current employment notes he is a good people person with good friends and relationships, a quick Landscape architect.  Preferences: The patient notes in his free time he likes to play music, play video games and spend time with friends and his animals  Abilities: Playing music and video gaming.   Type of Services Patient Feels are Needed: The patient notes he is not currently doing med therapy due to a lack of insurance, but is interested in med management and therapy.   Initial Clinical Notes/Concerns: The patient notes prior counseling in 2018 with a private provider and getting med management through [place] [facility] out of [place]. The patient notes prior inpatient stay for around 3days at [facility]. The patient notes no current S/I or H/I   Mental Health Symptoms Depression:   Change in energy/activity; Fatigue; Difficulty Concentrating;  Sleep (too much or little); Worthlessness; Irritability; Increase/decrease in appetite   Duration of Depressive symptoms:  Greater than two weeks   Mania:   N/A   Anxiety:    N/A; Sleep; Difficulty concentrating; Irritability; Tension; Worrying; Fatigue; Restlessness   Psychosis:   None   Duration of Psychotic symptoms: NA  Trauma:   -- (Patient moved to [name] in 2005 from [place]. Parents moved in 2019 back out of the county.)   Obsessions:   N/A   Compulsions:   N/A   Inattention:   N/A   Hyperactivity/Impulsivity:   N/A   Oppositional/Defiant Behaviors:   N/A   Emotional Irregularity:   N/A   Other Mood/Personality Symptoms:   NA    Mental Status Exam Appearance and self-care  Stature:   Tall   Weight:   Underweight   Clothing:   Casual   Grooming:   Normal   Cosmetic use:   None   Posture/gait:   Normal   Motor activity:   Not Remarkable   Sensorium  Attention:   Normal   Concentration:   Normal   Orientation:   X5   Recall/memory:   Normal   Affect and Mood  Affect:   Appropriate   Mood:   Anxious; Depressed   Relating  Eye contact:   Normal   Facial expression:   Responsive   Attitude toward examiner:   Cooperative   Thought and Language  Speech flow:  Normal   Thought content:   Appropriate to Mood and Circumstances   Preoccupation:   None   Hallucinations:   None   Organization:  Logical  Transport planner of Knowledge:   Average   Intelligence:   Average   Abstraction:   Normal   Judgement:   Fair   Art therapist:   Adequate   Insight:   Fair   Decision Making:   Impulsive   Social Functioning  Social Maturity:   Irresponsible   Social Judgement:   "Street Smart"   Stress  Stressors:   Family conflict; Transitions; Work (Bad relationship between patients parents and their families. Transportation stressors due to car problems)   Coping Ability:   Normal   Skill  Deficits:   None   Supports:   Friends/Service system     Religion: Religion/Spirituality Are You A Religious Person?: No How Might This Affect Treatment?: patient reports he might be spiritual, but not religious  Leisure/Recreation: Leisure / Recreation Do You Have Hobbies?: Yes Leisure and Hobbies: Playing music and video gaming  Exercise/Diet: Exercise/Diet Do You Exercise?: No Have You Gained or Lost A Significant Amount of Weight in the Past Six Months?: No Do You Follow a Special Diet?: No Do You Have Any Trouble Sleeping?: Yes Explanation of Sleeping Difficulties: The patient notes difficulty with both falling asleep as well as staying asleep   CCA Employment/Education Employment/Work Situation: Employment / Work Situation Employment Situation: Employed 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 How Long has Patient Been Employed?: 48yrs Are You Satisfied With Your Job?: Yes Do You Work More Than One Job?: No Work Stressors: Patient indicates stress for job comes from unreliable transportation Patient's Job has Been Impacted by Current Illness: Yes Describe how Patient's Job has Been Impacted: NA What is the Longest Time Patient has Held a Job?: 9 months Where was the Patient Employed at that Time?: [employer] Has Patient ever Been in the [employer]?: No  Education: Education Is Patient Currently Attending School?: No Last Grade Completed: 12 Name of [place]: [employer] Did You Graduate From [employer]?: Yes Did [place]?: Yes What Type of College Degree Do you Have?: The patient notes he graduated from [employer] with double major of french and international global studies  in 2018 and has [place] in these areas Did [place]?: No Did You Have Any Special Interests In School?: No Did You Have An Individualized Education Program (IIEP): No Did You Have Any Difficulty At School?: No   CCA Family/Childhood  History Family and Relationship History: Family history Marital status: Single Are you sexually active?: No What is your sexual orientation?: Heterosexual Has your sexual activity been affected by drugs, alcohol, medication, or emotional stress?: No Does patient have children?: No  Childhood History:  Childhood History By whom was/is the patient raised?: Both parents Additional childhood history information: Pt shared that he was born in [place] and have lived in [place], [place], and [place].  His parents were on [place] trips when he was a child as well as they moved to [place] when his mother wanted to work on obtaining a PhD from [employer]. Description of patient's relationship with caregiver when they were a child: Pt shared that his relationship with his parents is good.  They are both now living in [place]. Patient's description of current relationship with people who raised him/her: The patient notes he has limited family in the  [name] and his parents moved out of the [name] Jan 2019 to [place] How were you disciplined when you got in trouble as a child/adolescent?: Pt shared that he was never spanked just "punished" Does patient have siblings?: Yes Number of Siblings: 1 Description of patient's current relationship with siblings: The patient notes he has 1 sister who is 2 years younger. The patient notes the sister lives around an 1 hr away and has a close relationship with the sister. Did patient suffer any verbal/emotional/physical/sexual abuse as a child?: No Did patient suffer from severe childhood neglect?: No Has patient ever been sexually abused/assaulted/raped as an adolescent or adult?: No Was the patient ever a victim of a crime or a disaster?: No Witnessed domestic violence?: No Has patient been affected by domestic violence as an adult?: No  Child/Adolescent Assessment:     CCA Substance Use Alcohol/Drug Use: Alcohol / Drug Use Pain Medications: See  MAR Prescriptions: See MAR Over the Counter: None History of alcohol / drug use?: No history of alcohol / drug abuse Longest period of sobriety (when/how long): I use to smoke marajuna alot but i quit around 6 months ago. Negative Consequences of Use: Personal relationships, Work / School                         ASAM's:  Six Dimensions of Multidimensional Assessment  Dimension 1:  Acute Intoxication and/or Withdrawal Potential:      Dimension 2:  Biomedical Conditions and Complications:      Dimension 3:  Emotional, Behavioral, or Cognitive Conditions and Complications:     Dimension 4:  Readiness to Change:     Dimension 5:  Relapse, Continued use, or Continued Problem Potential:     Dimension 6:  Recovery/Living Environment:     ASAM Severity Score:    ASAM Recommended Level of Treatment:     Substance use Disorder (SUD)    Recommendations for Services/Supports/Treatments: Recommendations for Services/Supports/Treatments Recommendations For Services/Supports/Treatments: Individual Therapy, Medication Management  DSM5 Diagnoses: Patient Active Problem List   Diagnosis Date Noted   Alcohol use disorder 12/15/2022   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   Vaping nicotine dependence, tobacco product 12/15/2022   Hallucinogenic mushrooms use disorder, severe, 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   Lysergic acid diethylamide (LSD) use disorder, moderate, 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 12/15/2022   Generalized anxiety disorder with panic attacks 12/15/2022   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 09/05/2022   OSA (obstructive sleep apnea) 01/10/2020   Panic disorder 10/16/2017   Cannabis abuse, daily use 06/03/2016   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   Asthma 05/26/2016   Atopic eczema 05/26/2016   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   Allergic rhinitis, seasonal 07/14/2014    Patient Centered Plan: Patient is on the following Treatment Plan(s):  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 Patient/Guardian was advised if they have not already done so to contact the registration department to sign all necessary forms in order for [name] to release information regarding their care.   Consent: Patient/Guardian gives verbal consent for treatment and assignment of benefits for services provided during this visit. Patient/Guardian expressed understanding and agreed to proceed.   I discussed the assessment and treatment plan with the patient. The patient was provided an opportunity to ask questions and all were answered. The patient agreed with the plan and demonstrated an understanding of the instructions.   The patient was advised to call back or seek an in-person evaluation if the symptoms worsen or if the condition fails to improve as anticipated.  I provided 60 minutes of non-face-to-face time during this encounter.   Lennox Grumbles, LCSW  12/20/2022

## 2023-01-16 ENCOUNTER — Telehealth (HOSPITAL_COMMUNITY): Payer: Medicaid Other | Admitting: Psychiatry

## 2023-01-18 ENCOUNTER — Ambulatory Visit: Payer: Medicaid Other | Admitting: Family Medicine

## 2023-01-24 ENCOUNTER — Ambulatory Visit (INDEPENDENT_AMBULATORY_CARE_PROVIDER_SITE_OTHER): Payer: Medicaid Other | Admitting: Clinical

## 2023-01-24 DIAGNOSIS — F332 Major depressive disorder, recurrent severe without psychotic features: Secondary | ICD-10-CM

## 2023-01-24 DIAGNOSIS — F411 Generalized anxiety disorder: Secondary | ICD-10-CM | POA: Diagnosis not present

## 2023-01-24 NOTE — Progress Notes (Signed)
Virtual Visit via Video Note  I connected with Stuart Harper on 01/24/23 at  1:00 PM EDT by a video enabled telemedicine application and verified that I am speaking with the correct person using two identifiers.  Location: Patient: Home Provider: Office   I discussed the limitations of evaluation and management by telemedicine and the availability of in person appointments. The patient expressed understanding and agreed to proceed.  THERAPY PROGRESS NOTE   Session Time: 1:00 PM- 1:30 PM   Participation Level: Active   Behavioral Response: CasualAlertDepressed   Type of Therapy: Individual Therapy   Treatment Goals addressed: Coping   Interventions: CBT, DBT, Solution Focused, Strength-based and Supportive   Summary: Stuart Harper a 27y.o. male who presents with GAD/ Recurrent, Severe MDD with psych feat . The OPT therapist worked with the patient for her OPT treatment session. The OPT therapist utilized Motivational Interviewing to assist in creating therapeutic repore. The patient in the session was engaged and work in collaboration giving feedback about her triggers and symptoms over the past few weeks. The patient noted he was able to be more social and active over the past week going out with friends. The OPT therapist worked with the patient on individualized coping to assist the patient to manage symptoms of his MH diagnoses. The patient spoke about his frustration with not being able to yet get his med therapy started due to his last appointment being cancelled by provider, however, noted he will be calling in to try to get this set again.    Suicidal/Homicidal: Nowithout intent/plan   Therapist Response:The OPT therapist worked with the patient for the patients scheduled session. The patient was engaged in her session and gave feedback in relation to triggers, symptoms, and behavior responses over the past few weeks. The patient identified having ongoing MH  symptom difficulty. However, was active in the past week going out with friends over the past weekend. The OPT therapist worked with the patient utilizing an in session Cognitive Behavioral Therapy. The patient was responsive in the session and verbalized, " I got frustrated when my last med management appointment got cancelled so I just kind of gave up, and my PCP left [facility] and I have been looking for a new PCP because if I can't get back in with Dr. Adrian Blackwater I was told I would need another referral". The OPT therapist worked with the patient providing psycho-education..The OPT therapist will continue treatment work with the patient in her next scheduled session.   Plan: Return again in 2/3 weeks.   Diagnosis:      Axis I:GAD/ Recurrent Severe MDD with psych feat                           Axis II: No diagnosis       Collaboration of Care: No Additional Collaboration for this session.   Patient/Guardian was advised Release of Information must be obtained prior to any record release in order to collaborate their care with an outside provider. Patient/Guardian was advised if they have not already done so to contact the registration department to sign all necessary forms in order for [name] to release information regarding their care.    Consent: Patient/Guardian gives verbal consent for treatment and assignment of benefits for services provided during this visit. Patient/Guardian expressed understanding and agreed to proceed.    I discussed the assessment and treatment plan with the patient. The patient was provided an  opportunity to ask questions and all were answered. The patient agreed with the plan and demonstrated an understanding of the instructions.   The patient was advised to call back or seek an in-person evaluation if the symptoms worsen or if the condition fails to improve as anticipated.   I provided 30 minutes of non-face-to-face time during this encounter.   Suzan Garibaldi, LCSW    01/24/2023

## 2023-01-26 ENCOUNTER — Encounter: Payer: Self-pay | Admitting: Nurse Practitioner

## 2023-01-26 ENCOUNTER — Ambulatory Visit (INDEPENDENT_AMBULATORY_CARE_PROVIDER_SITE_OTHER): Payer: Medicaid Other | Admitting: Nurse Practitioner

## 2023-01-26 VITALS — BP 122/86 | HR 94 | Temp 98.8°F | Ht 72.0 in | Wt 190.8 lb

## 2023-01-26 DIAGNOSIS — F410 Panic disorder [episodic paroxysmal anxiety] without agoraphobia: Secondary | ICD-10-CM

## 2023-01-26 DIAGNOSIS — F411 Generalized anxiety disorder: Secondary | ICD-10-CM | POA: Diagnosis not present

## 2023-01-26 DIAGNOSIS — F1921 Other psychoactive substance dependence, in remission: Secondary | ICD-10-CM

## 2023-01-26 DIAGNOSIS — B369 Superficial mycosis, unspecified: Secondary | ICD-10-CM

## 2023-01-26 DIAGNOSIS — F332 Major depressive disorder, recurrent severe without psychotic features: Secondary | ICD-10-CM

## 2023-01-26 NOTE — Progress Notes (Unsigned)
Bethanie Dicker, NP-C Phone: [phone]  Stuart Harper is a 27 y.o. male who presents today to establish care.   Anxiety/Depression/Polysubstance Abuse- Patient has a significant history of anxiety, depression and substance abuse. He is not currently on any medications. He denies currently using any illicit drugs. He does vape nicotine and reports drinking approximately one beer per day. He denies SI/HI. PHQ- 18 and GAD- 15. He is not interested in starting on any medications today. He is requesting a referral to Psychiatry for additional evaluation and a more formal diagnosis. He is concerned he may have ADHD, OCD or Autism. He would like testing prior to starting on a medication. He did see a Psychiatrist recently but did not want to continue seeing them as it was only via video and he would like in person visits. He does see a counselor regularly.   Rash- Reports 2 darker colored ring shaped areas x 3 months. One on his left chest and one on his left groin. Denies pruritus. Denies pain. They are not raised. Denies any new changes in soaps, lotions, exposures, etc. They have not changed in shape or size.   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/15/2022   Vaping nicotine dependence, tobacco product 12/15/2022   Hallucinogenic mushrooms use disorder, severe, 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/15/2022   Lysergic acid diethylamide (LSD) use disorder, moderate, 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/15/2022   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 12/15/2022   Generalized anxiety disorder with panic attacks 12/15/2022   Polysubstance dependence in early, early partial, sustained full, or sustained partial remission (HCC) 01/27/2023   Severe episode of recurrent major depressive disorder, without psychotic features (HCC) 01/27/2023   Fungal skin infection 01/27/2023   Resolved Ambulatory Problems    Diagnosis Date Noted   Screening 04/29/2015   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   Hallucinogenic mushrooms use disorder, severe (HCC) 04/21/2017   Severe recurrent major depression without psychotic features (HCC) 10/16/2017   Bipolar I disorder, most recent episode depressed, severe without psychotic features (HCC) 10/19/2017   Sedative, hypnotic or anxiolytic use disorder, severe, 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/20/2017   Benzodiazepine withdrawal (HCC) 10/20/2017   OCD (obsessive compulsive disorder) 10/20/2017   Wart of face 09/05/2022   Past Medical History:  Diagnosis Date   Allergy    Anxiety    Seasonal allergies     Family History  Problem Relation Age of Onset   Allergies Mother    Asthma Mother    Migraines Mother    Hypertension Father    Allergies Father    Osteoarthritis Father    Depression Father    Lung cancer Maternal Grandfather    Bipolar disorder Paternal Grandmother    Alzheimer's disease Paternal Grandfather    Rheum arthritis Paternal Uncle    Asthma Paternal Uncle    CAD Other     Social History   Socioeconomic History   Marital status: Single    Spouse name: Not on file   Number of children: 0   Years of education: Not on file  Highest education level: Associate degree: occupational, Scientist, product/process development, or vocational program  Occupational History   Not on file  Tobacco Use   Smoking status: Every Day    Types: E-cigarettes   Smokeless tobacco: Never   Tobacco comments:    Vape cartridge lasts 1 week  Vaping Use   Vaping Use: Never used  Substance and Sexual Activity    Alcohol use: Yes    Comment: 2-3 drinks per night with 4-6 on occasion   Drug use: Yes    Types: Marijuana, Cocaine, Benzodiazepines, Oxycodone, Psilocybin, LSD, Methylphenidate    Comment: See psychiatry note from 12/15/22   Sexual activity: Not Currently  Other Topics Concern   Not on file  Social History Narrative   Lives with parents in 2 story home.     Student at [employer], sophomore. 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            Social Determinants of Health   Financial Resource Strain: Low Risk  (10/19/2017)   Overall Financial Resource Strain (CARDIA)    Difficulty of Paying Living Expenses: Not hard at all  Food Insecurity: No Food Insecurity (10/19/2017)   Hunger Vital Sign    Worried About Running Out of Food in the Last Year: Never true    Ran Out of Food in the Last Year: Never true  Transportation Needs: No Transportation Needs (10/19/2017)   PRAPARE - Administrator, Civil Service (Medical): No    Lack of Transportation (Non-Medical): No  Physical Activity: Inactive (10/19/2017)   Exercise Vital Sign    Days of Exercise per Week: 0 days    Minutes of Exercise per Session: 0 min  Stress: Stress Concern Present (10/19/2017)   [employer] of Occupational Health - Occupational Stress Questionnaire    Feeling of Stress : Very much  Social Connections: Unknown (10/19/2017)   Social Connection and Isolation Panel [NHANES]    Frequency of Communication with Friends and Family: Not on file    Frequency of Social Gatherings with Friends and Family: Not on file    Attends Religious Services: Never    Active Member of Clubs or Organizations: No    Attends Banker Meetings: Never    Marital Status: Never married  Intimate Partner Violence: Not At Risk (10/19/2017)   Humiliation, Afraid, Rape, and Kick questionnaire    Fear of Current or Ex-Partner: No    Emotionally Abused: No     Physically Abused: No    Sexually Abused: No    ROS  General:  Negative for unexplained weight loss, fever Skin: Negative for new or changing mole, sore that won't heal HEENT: Negative for trouble hearing, trouble seeing, ringing in ears, mouth sores, hoarseness, change in voice, dysphagia. CV:  Negative for chest pain, dyspnea, edema, palpitations Resp: Negative for cough, dyspnea, hemoptysis GI: Negative for nausea, vomiting, diarrhea, constipation, abdominal pain, melena, hematochezia. GU: Negative for dysuria, incontinence, urinary hesitance, hematuria, vaginal or penile discharge, polyuria, sexual difficulty, lumps in testicle or breasts MSK: Negative for muscle cramps or aches, joint pain or swelling Neuro: Negative for headaches, weakness, numbness, dizziness, passing out/fainting Psych: Negative for memory problems  Objective  Physical Exam Vitals:   01/26/23 1415  BP: 122/86  Pulse: 94  Temp: 98.8 F (37.1 C)  SpO2: 98%    BP Readings from Last 3 Encounters:  01/26/23 122/86  10/17/22 126/89  09/26/22 (!) 138/90  Wt Readings from Last 3 Encounters:  01/26/23 190 lb 12.8 oz (86.5 kg)  09/26/22 182 lb (82.6 kg)  09/05/22 184 lb (83.5 kg)    Physical Exam Constitutional:      General: He is not in acute distress.    Appearance: Normal appearance.  HENT:     Head: Normocephalic.  Cardiovascular:     Rate and Rhythm: Normal rate and regular rhythm.     Heart sounds: Normal heart sounds.  Pulmonary:     Effort: Pulmonary effort is normal.     Breath sounds: Normal breath sounds.  Skin:    General: Skin is warm and dry.     Findings: Lesion present.     Comments: 2- dark, scaling, circular patches noted on exam. One present in left groin area, second on left chest.They are not raised. No drainage present. No erythema.   Neurological:     General: No focal deficit present.     Mental Status: He is alert.  Psychiatric:        Mood and Affect: Mood normal.  Affect is flat.        Speech: Speech normal.        Behavior: Behavior normal.        Thought Content: Thought content normal.    Assessment/Plan:   Polysubstance dependence in early, early partial, sustained full, or sustained partial remission (HCC) Assessment & Plan: Will refer to Psychiatry for further management and evaluation. Reports currently vaping nicotine and drinking one beer per day. Denies any other substances/illicit drugs.   Orders: -     Ambulatory referral to Psychiatry  Severe episode of recurrent major depressive disorder, without psychotic features (HCC) Assessment & Plan: PHQ- 18. Denies SI/HI. Will refer to Psychiatry. Patient requesting further work-up and testing prior to starting on medications. Encouraged to contact if worsening symptoms, unusual behavior changes or suicidal thoughts occur. Advised to continue seeing counselor regularly. Will continue to monitor.   Orders: -     Ambulatory referral to Psychiatry  Generalized anxiety disorder with panic attacks Assessment & Plan: GAD- 15. Will refer to Psychiatry. Patient requesting further work-up and testing prior to starting on medications. Encouraged to contact if worsening symptoms, unusual behavior changes or suicidal thoughts occur. Advised to continue seeing counselor regularly. Will continue to monitor.   Orders: -     Ambulatory referral to Psychiatry  Fungal skin infection Assessment & Plan: Likely fungal in nature. Will treat with Lotrisone cream BID. Encouraged to contact if worsening or not improving.   Orders: -     Clotrimazole-Betamethasone; Apply 1 Application topically 2 (two) times daily. X 1 week.  Dispense: 30 g; Refill: 0    Return for Annual Exam.   Bethanie Dicker, NP-C  Primary [facility] - [facility]

## 2023-01-27 DIAGNOSIS — F1921 Other psychoactive substance dependence, in remission: Secondary | ICD-10-CM | POA: Insufficient documentation

## 2023-01-27 DIAGNOSIS — B369 Superficial mycosis, unspecified: Secondary | ICD-10-CM | POA: Insufficient documentation

## 2023-01-27 DIAGNOSIS — F332 Major depressive disorder, recurrent severe without psychotic features: Secondary | ICD-10-CM | POA: Insufficient documentation

## 2023-01-27 MED ORDER — CLOTRIMAZOLE-BETAMETHASONE 1-0.05 % EX CREA: 1.0000 | TOPICAL_CREAM | Freq: Two times a day (BID) | CUTANEOUS | 0 refills | Status: AC

## 2023-01-30 ENCOUNTER — Telehealth (HOSPITAL_COMMUNITY): Payer: Self-pay | Admitting: *Deleted

## 2023-01-30 NOTE — Telephone Encounter (Signed)
Opened in Error.

## 2023-01-31 ENCOUNTER — Encounter: Payer: Self-pay | Admitting: Nurse Practitioner

## 2023-01-31 ENCOUNTER — Encounter: Payer: Medicaid Other | Admitting: Nurse Practitioner

## 2023-01-31 NOTE — Assessment & Plan Note (Addendum)
GAD- 15. Will refer to Psychiatry. Patient requesting further work-up and testing prior to starting on medications. Encouraged to contact if worsening symptoms, unusual behavior changes or suicidal thoughts occur. Advised to continue seeing counselor regularly. Will continue to monitor.

## 2023-01-31 NOTE — Assessment & Plan Note (Signed)
Likely fungal in nature. Will treat with Lotrisone cream BID. Encouraged to contact if worsening or not improving.

## 2023-01-31 NOTE — Assessment & Plan Note (Signed)
Will refer to Psychiatry for further management and evaluation. Reports currently vaping nicotine and drinking one beer per day. Denies any other substances/illicit drugs.

## 2023-01-31 NOTE — Assessment & Plan Note (Signed)
PHQ- 18. Denies SI/HI. Will refer to Psychiatry. Patient requesting further work-up and testing prior to starting on medications. Encouraged to contact if worsening symptoms, unusual behavior changes or suicidal thoughts occur. Advised to continue seeing counselor regularly. Will continue to monitor.

## 2023-02-03 ENCOUNTER — Ambulatory Visit: Payer: Medicaid Other | Admitting: Nurse Practitioner

## 2023-02-07 ENCOUNTER — Ambulatory Visit (INDEPENDENT_AMBULATORY_CARE_PROVIDER_SITE_OTHER): Payer: Medicaid Other | Admitting: Nurse Practitioner

## 2023-02-07 ENCOUNTER — Other Ambulatory Visit (HOSPITAL_COMMUNITY)
Admission: RE | Admit: 2023-02-07 | Discharge: 2023-02-07 | Disposition: A | Discharge status: Home / Self Care | Payer: Medicaid Other | Source: Ambulatory Visit | Attending: Nurse Practitioner | Admitting: Nurse Practitioner

## 2023-02-07 ENCOUNTER — Encounter: Payer: Self-pay | Admitting: Nurse Practitioner

## 2023-02-07 VITALS — BP 128/80 | HR 96 | Temp 98.5°F | Ht 72.0 in | Wt 193.0 lb

## 2023-02-07 DIAGNOSIS — Z13220 Encounter for screening for lipoid disorders: Secondary | ICD-10-CM

## 2023-02-07 DIAGNOSIS — Z23 Encounter for immunization: Secondary | ICD-10-CM

## 2023-02-07 DIAGNOSIS — Z113 Encounter for screening for infections with a predominantly sexual mode of transmission: Secondary | ICD-10-CM

## 2023-02-07 DIAGNOSIS — G4733 Obstructive sleep apnea (adult) (pediatric): Secondary | ICD-10-CM

## 2023-02-07 DIAGNOSIS — Z1329 Encounter for screening for other suspected endocrine disorder: Secondary | ICD-10-CM | POA: Diagnosis not present

## 2023-02-07 DIAGNOSIS — Z0000 Encounter for general adult medical examination without abnormal findings: Secondary | ICD-10-CM

## 2023-02-07 DIAGNOSIS — Z114 Encounter for screening for human immunodeficiency virus [HIV]: Secondary | ICD-10-CM | POA: Diagnosis not present

## 2023-02-07 NOTE — Assessment & Plan Note (Signed)
Physical exam complete. Lab work as outlined. Will contact patient with results. Flu vaccine not due. Tetanus vaccine given today in office. Declined additional COVID vaccines. HIV screening today. Hep C screening negative. Recommended follow up with Dentist and establishing with Ophthalmology for annual exams. Encouraged to work on [employer] and to begin exercising. Advised patient to contact if he does not hear anything regarding his referral to Psychiatry. I have reached out to our referrals coordinator for additional information. Return to care in one year, sooner PRN.

## 2023-02-07 NOTE — Progress Notes (Signed)
Bethanie Dicker, NP-C Phone: [phone]  Stuart Harper is a 27 y.o. male who presents today for annual exam. He has no complaints or new concerns today. He is still waiting to hear back regarding his Psychiatry referral. He is requesting STD testing. He denies any recent exposure. Denies any symptoms. He would like to have routine testing since he has not had any recently.   Diet: Poor- eats a lot of fast food Exercise: None Family history-  Prostate cancer: Yes, paternal uncle  Colon cancer: No Sexually active: Yes Vaccines-   Flu: Not due  Tetanus: 02/28/2007- Due!  COVID19: x 2 HIV screening: Today Hep C Screening: Negative Tobacco use: No- vapes nicotine Alcohol use: Yes, one beer per day Illicit Drug use: No Dentist: Yes Ophthalmology: No   Social History   Tobacco Use  Smoking Status Former  Smokeless Tobacco Never    Current Outpatient Medications on File Prior to Visit  Medication Sig Dispense Refill   clotrimazole-betamethasone (LOTRISONE) cream Apply 1 Application topically 2 (two) times daily. X 1 week. 30 g 0   fluticasone (FLONASE) 50 MCG/ACT nasal spray Place 2 sprays into both nostrils daily. 16 g 1   valACYclovir (VALTREX) 500 MG tablet Take 500 mg by mouth daily.     No current facility-administered medications on file prior to visit.    ROS see history of present illness  Objective  Physical Exam Vitals:   02/07/23 1407  BP: 128/80  Pulse: 96  Temp: 98.5 F (36.9 C)  SpO2: 98%    BP Readings from Last 3 Encounters:  02/07/23 128/80  01/26/23 122/86  10/17/22 126/89   Wt Readings from Last 3 Encounters:  02/07/23 193 lb (87.5 kg)  01/26/23 190 lb 12.8 oz (86.5 kg)  09/26/22 182 lb (82.6 kg)    Physical Exam Constitutional:      General: He is not in acute distress.    Appearance: Normal appearance.  HENT:     Head: Normocephalic.     Right Ear: Tympanic membrane normal.     Left Ear: Tympanic membrane normal.      Nose: Nose normal.     Mouth/Throat:     Mouth: Mucous membranes are moist.     Pharynx: Oropharynx is clear.  Eyes:     Conjunctiva/sclera: Conjunctivae normal.     Pupils: Pupils are equal, round, and reactive to light.  Neck:     Thyroid: No thyromegaly.  Cardiovascular:     Rate and Rhythm: Normal rate and regular rhythm.     Heart sounds: Normal heart sounds.  Pulmonary:     Effort: Pulmonary effort is normal.     Breath sounds: Normal breath sounds.  Abdominal:     General: Abdomen is flat. Bowel sounds are normal.     Palpations: Abdomen is soft. There is no mass.     Tenderness: There is no abdominal tenderness.  Musculoskeletal:        General: Normal range of motion.  Lymphadenopathy:     Cervical: No cervical adenopathy.  Skin:    General: Skin is warm and dry.     Findings: No rash.  Neurological:     General: No focal deficit present.     Mental Status: He is alert.  Psychiatric:        Mood and Affect: Mood normal.        Behavior: Behavior normal.    Assessment/Plan: Please see individual problem list.  Preventative health care Assessment &  Plan: Physical exam complete. Lab work as outlined. Will contact patient with results. Flu vaccine not due. Tetanus vaccine given today in office. Declined additional COVID vaccines. HIV screening today. Hep C screening negative. Recommended follow up with Dentist and establishing with Ophthalmology for annual exams. Encouraged to work on [employer] and to begin exercising. Advised patient to contact if he does not hear anything regarding his referral to Psychiatry. I have reached out to our referrals coordinator for additional information. Return to care in one year, sooner PRN.   Orders: -     CBC with Differential/Platelet -     Comprehensive metabolic panel -     VITAMIN D 25 Hydroxy (Vit-D Deficiency, Fractures)  OSA (obstructive sleep apnea) Assessment & Plan: Patient reports worsening symptoms. Not sleeping  well. He does not feel like his CPAP is helping. He does wear it every night. He would like to be re-evaluated. Will refer to Pulmonology.   Orders: -     Ambulatory referral to Pulmonology  Screening for STD (sexually transmitted disease) -     Urine cytology ancillary only -     HSV(herpes simplex vrs) 1+2 ab-IgG -     RPR  Lipid screening -     Lipid panel  Thyroid disorder screen -     TSH  Encounter for screening for HIV -     HIV Antibody (routine testing w rflx)  Need for Tdap vaccination -     Tdap vaccine greater than or equal to 7yo IM   Return in about 1 year (around 02/07/2024) for Annual Exam, sooner PRN.   Bethanie Dicker, NP-C [facility]

## 2023-02-07 NOTE — Assessment & Plan Note (Addendum)
Patient reports worsening symptoms. Not sleeping well. He does not feel like his CPAP is helping. He does wear it every night. He would like to be re-evaluated. Will refer to Pulmonology.

## 2023-02-08 LAB — COMPREHENSIVE METABOLIC PANEL
ALT: 33 U/L (ref 0–53)
AST: 21 U/L (ref 0–37)
Albumin: 4.5 g/dL (ref 3.5–5.2)
Alkaline Phosphatase: 58 U/L (ref 39–117)
BUN: 10 mg/dL (ref 6–23)
CO2: 28 mEq/L (ref 19–32)
Calcium: 9.4 mg/dL (ref 8.4–10.5)
Chloride: 101 mEq/L (ref 96–112)
Creatinine, Ser: 0.96 mg/dL (ref 0.40–1.50)
GFR: 107.63 mL/min (ref >60.00)
Glucose, Bld: 76 mg/dL (ref 70–99)
Potassium: 4.3 mEq/L (ref 3.5–5.1)
Sodium: 138 mEq/L (ref 135–145)
Total Bilirubin: 0.5 mg/dL (ref 0.2–1.2)
Total Protein: 7 g/dL (ref 6.0–8.3)

## 2023-02-08 LAB — LIPID PANEL
Cholesterol: 240 mg/dL — ABNORMAL HIGH (ref 0–200)
HDL: 58.2 mg/dL (ref >39.00)
LDL Cholesterol: 148 mg/dL — ABNORMAL HIGH (ref 0–99)
NonHDL: 182.23
Total CHOL/HDL Ratio: 4
Triglycerides: 172 mg/dL — ABNORMAL HIGH (ref 0.0–149.0)
VLDL: 34.4 mg/dL (ref 0.0–40.0)

## 2023-02-08 LAB — CBC WITH DIFFERENTIAL/PLATELET
Basophils Absolute: 0 10*3/uL (ref 0.0–0.1)
Basophils Relative: 0.9 % (ref 0.0–3.0)
Eosinophils Absolute: 0.2 10*3/uL (ref 0.0–0.7)
Eosinophils Relative: 3.8 % (ref 0.0–5.0)
HCT: 47.8 % (ref 39.0–52.0)
Hemoglobin: 15.9 g/dL (ref 13.0–17.0)
Lymphocytes Relative: 30.9 % (ref 12.0–46.0)
Lymphs Abs: 1.4 10*3/uL (ref 0.7–4.0)
MCHC: 33.4 g/dL (ref 30.0–36.0)
MCV: 89.7 fl (ref 78.0–100.0)
Monocytes Absolute: 0.5 10*3/uL (ref 0.1–1.0)
Monocytes Relative: 10.7 % (ref 3.0–12.0)
Neutro Abs: 2.5 10*3/uL (ref 1.4–7.7)
Neutrophils Relative %: 53.7 % (ref 43.0–77.0)
Platelets: 178 10*3/uL (ref 150.0–400.0)
RBC: 5.32 Mil/uL (ref 4.22–5.81)
RDW: 13.1 % (ref 11.5–15.5)
WBC: 4.7 10*3/uL (ref 4.0–10.5)

## 2023-02-08 LAB — TSH: TSH: 1.52 u[IU]/mL (ref 0.35–5.50)

## 2023-02-08 LAB — HIV ANTIBODY (ROUTINE TESTING W REFLEX): HIV 1&2 Ab, 4th Generation: NONREACTIVE

## 2023-02-08 LAB — VITAMIN D 25 HYDROXY (VIT D DEFICIENCY, FRACTURES): VITD: 30.53 ng/mL (ref 30.00–100.00)

## 2023-02-08 LAB — RPR: RPR Ser Ql: NONREACTIVE

## 2023-02-08 LAB — HSV(HERPES SIMPLEX VRS) I + II AB-IGG
HAV 1 IGG,TYPE SPECIFIC AB: 29.4 index — ABNORMAL HIGH
HSV 2 IGG,TYPE SPECIFIC AB: <0.9 index

## 2023-02-09 LAB — URINE CYTOLOGY ANCILLARY ONLY
Chlamydia: NEGATIVE
Comment: NEGATIVE
Comment: NEGATIVE
Comment: NORMAL
Neisseria Gonorrhea: NEGATIVE
Trichomonas: NEGATIVE

## 2023-02-10 ENCOUNTER — Other Ambulatory Visit: Payer: Self-pay | Admitting: Nurse Practitioner

## 2023-02-10 ENCOUNTER — Ambulatory Visit: Payer: Medicaid Other | Admitting: Nurse Practitioner

## 2023-02-10 DIAGNOSIS — F1921 Other psychoactive substance dependence, in remission: Secondary | ICD-10-CM

## 2023-02-10 DIAGNOSIS — F410 Panic disorder [episodic paroxysmal anxiety] without agoraphobia: Secondary | ICD-10-CM

## 2023-02-10 DIAGNOSIS — F332 Major depressive disorder, recurrent severe without psychotic features: Secondary | ICD-10-CM

## 2023-02-14 ENCOUNTER — Telehealth (HOSPITAL_COMMUNITY): Payer: Medicaid Other | Admitting: Psychiatry

## 2023-02-20 ENCOUNTER — Encounter: Payer: Self-pay | Admitting: Nurse Practitioner

## 2023-02-20 DIAGNOSIS — T7840XD Allergy, unspecified, subsequent encounter: Secondary | ICD-10-CM

## 2023-02-21 DIAGNOSIS — F32A Depression, unspecified: Secondary | ICD-10-CM | POA: Diagnosis not present

## 2023-02-21 DIAGNOSIS — F411 Generalized anxiety disorder: Secondary | ICD-10-CM | POA: Diagnosis not present

## 2023-02-21 DIAGNOSIS — Z79899 Other long term (current) drug therapy: Secondary | ICD-10-CM | POA: Diagnosis not present

## 2023-02-22 ENCOUNTER — Ambulatory Visit: Payer: Medicaid Other | Admitting: Primary Care

## 2023-02-22 ENCOUNTER — Encounter: Payer: Self-pay | Admitting: Primary Care

## 2023-02-22 VITALS — BP 132/84 | HR 82 | Temp 98.2°F | Ht 72.0 in | Wt 191.6 lb

## 2023-02-22 DIAGNOSIS — G4733 Obstructive sleep apnea (adult) (pediatric): Secondary | ICD-10-CM | POA: Diagnosis not present

## 2023-02-22 DIAGNOSIS — G4752 REM sleep behavior disorder: Secondary | ICD-10-CM

## 2023-02-22 MED ORDER — MELATONIN 10 MG PO TABS: ORAL_TABLET | ORAL | 0 refills | Status: AC

## 2023-02-22 NOTE — Progress Notes (Signed)
@Patient  ID: Stuart Harper, male    DOB: 10-30-1995, 27 y.o.   MRN: [id number]  Chief Complaint  Patient presents with   Consult    Currently on CPAP. Mask and Pressure are fine. Wears CPAP 8 hours a night.     Referring provider: Bethanie Dicker, NP  HPI: 27 year old male, former smoker.   02/22/2023 Patient had home sleep study in 2021 that showed evidence of moderate OSA, AHI 22/hour. He has been on CPAP for the last 3 years. SD card did not have data for review today. He is consistently wearing CPAP, average 8 hours a night. He noticed some benefit from the first 6-12 months. He has not been sleeping well for last year or two. Reports restless sleep as well as nightmares. He wakes up with cold a sweat and vivid nightmares 2-3 times a week. He will dream about what he has to do that week but states that it goes terribly wrong and there will be monsters. He has been told that he acts our his dreams and has been noted to talk in his sleep since he was a child. No concern for narcolepsy, cataplexy. No sleep walking.   He has tried melatonin in the past but not currently taking. Started on bupropion yesterday for anxiety/ADHD symptoms, he will see psychiatry back in 2 weeks.  He works evenings.  Typical bedtime is between 12 AM and 4 AM.  Takes him an hour to fall asleep.  He wakes up 1-3 times a night.  He starts his day between noon and 2 PM.  Weight is up 10 pounds.  CPAP pressure setting 5 to 20 cm H2O.  Allergies  Allergen Reactions   Shellfish Allergy Rash    Immunization History  Administered Date(s) Administered   DTaP 11/10/1994, 01/19/1995, 03/03/1995, 09/08/1995, 02/17/1999   HIB (PRP-OMP) 11/10/1994, 01/19/1995, 03/03/1995, 09/08/1995   HIB (PRP-T) 11/10/1994, 01/19/1995, 03/03/1995, 02/17/1999   HPV Quadrivalent 12/14/2012, 02/18/2013, 09/06/2013   Hepatitis A 11/29/1994, 06/09/1995   Hepatitis A, Ped/Adol-2 Dose 11/29/1994, 06/09/1995   Hepatitis B  03/03/1995, 04/04/1995, 09/27/1995   Hepatitis B, PED/ADOLESCENT 03/03/1995, 04/04/1995, 09/27/1995   IPV 11/10/1994, 01/19/1995, 03/03/1995, 02/17/1999   Influenza Split 07/03/2009, 06/03/2010   Influenza,inj,Quad PF,6+ Mos 06/14/2014   MMR 09/08/1995, 02/17/1999   Meningococcal Conjugate 02/28/2007, 12/09/2010   Tdap 02/28/2007, 02/07/2023   Varicella 09/08/1995, 02/28/2007    Past Medical History:  Diagnosis Date   Allergy    Anxiety    Asthma    Benzodiazepine withdrawal (HCC) 10/20/2017   Bipolar I disorder, most recent episode depressed, severe without psychotic features (HCC) 10/19/2017   Hallucinogenic mushrooms use disorder, severe (HCC) 04/21/2017   OCD (obsessive compulsive disorder) 10/20/2017   Seasonal allergies    Sedative, hypnotic or anxiolytic use disorder, severe, 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     fluticasone (FLONASE) 50 MCG/ACT nasal spray Place 2 sprays into both nostrils daily. 16 g 1   valACYclovir (VALTREX) 500 MG tablet Take 500 mg by mouth as needed.     clotrimazole-betamethasone (LOTRISONE) cream Apply 1 Application topically 2 (two) times  daily. X 1 week. (Patient not taking: Reported on 02/22/2023) 30 g 0   No facility-administered medications prior to visit.   Review of Systems  Review of Systems  Constitutional:  Positive for fatigue.  Respiratory: Negative.    Cardiovascular: Negative.      Physical Exam  BP 132/84 (BP Location: Left Arm, Cuff Size: Normal)   Pulse 82   Temp 98.2 F (36.8 C)   Ht 6' (1.829 m)   Wt 191 lb 9.6 oz (86.9 kg)    SpO2 92%   BMI 25.99 kg/m  Physical Exam Constitutional:      Appearance: Normal appearance.  HENT:     Head: Normocephalic and atraumatic.  Cardiovascular:     Rate and Rhythm: Normal rate and regular rhythm.  Pulmonary:     Effort: Pulmonary effort is normal.     Breath sounds: Normal breath sounds.  Musculoskeletal:        General: Normal range of motion.  Skin:    General: Skin is warm and dry.  Neurological:     General: No focal deficit present.     Mental Status: He is alert and oriented to person, place, and time. Mental status is at baseline.  Psychiatric:        Mood and Affect: Mood normal.        Behavior: Behavior normal.        Thought Content: Thought content normal.        Judgment: Judgment normal.      Lab Results:  CBC    Component Value Date/Time   WBC 4.7 02/07/2023 1441   RBC 5.32 02/07/2023 1441   HGB 15.9 02/07/2023 1441   HGB 15.8 11/15/2019 1600   HCT 47.8 02/07/2023 1441   HCT 44.7 11/15/2019 1600   PLT 178.0 02/07/2023 1441   PLT 159 11/15/2019 1600   MCV 89.7 02/07/2023 1441   MCV 85 11/15/2019 1600   MCH 30.2 11/15/2019 1600   MCH 29.8 10/16/2017 0511   MCHC 33.4 02/07/2023 1441   RDW 13.1 02/07/2023 1441   RDW 12.5 11/15/2019 1600   LYMPHSABS 1.4 02/07/2023 1441   LYMPHSABS 1.1 05/26/2016 1028   MONOABS 0.5 02/07/2023 1441   EOSABS 0.2 02/07/2023 1441   EOSABS 0.1 05/26/2016 1028   BASOSABS 0.0 02/07/2023 1441   BASOSABS 0.0 05/26/2016 1028    BMET    Component Value Date/Time   NA 138 02/07/2023 1441   NA 140 11/15/2019 1600   K 4.3 02/07/2023 1441   CL 101 02/07/2023 1441   CO2 28 02/07/2023 1441   GLUCOSE 76 02/07/2023 1441   BUN 10 02/07/2023 1441   BUN 12 11/15/2019 1600   CREATININE 0.96 02/07/2023 1441   CALCIUM 9.4 02/07/2023 1441   GFRNONAA 122 11/15/2019 1600   GFRAA 141 11/15/2019 1600    BNP No results found for: "BNP"  ProBNP No results found for: "PROBNP"  Imaging: No results  found.   Assessment & Plan:   No problem-specific Assessment & Plan notes found for this encounter.  Symptoms are consistent with OSA and REM sleep disorder. Melatonin has been shown to help decrease symptoms, if not effective we can try another medication called Klonopin    Recommendations: - Start melatonin 5mg  at bedtime x 3 nights; if tolerating increase to 10mg  at bedtime  - Continue to wear CPAP nightly 4-6 hours or longer - Maintain a regular and consistent bedtime/wake time  - Please bring CPAP by Christoper Allegra so that  they can pull a download from your machine for me to look at settings and AHI   Follow-up: - 2 week virtual visit with Waynetta Sandy (ok to double book with a shared decision slot)  Glenford Bayley, NP 02/22/2023

## 2023-02-22 NOTE — Patient Instructions (Addendum)
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 Melatonin has been shown to help decrease symptoms, if not effective we can try another medication called Klonopin   Recommendations: - Start melatonin 5mg  at bedtime x 3 nights; if tolerating increase to 10mg  at bedtime  - Continue to wear CPAP nightly 4-6 hours or longer - Maintain a regular and consistent bedtime/wake time  - Please bring CPAP by Christoper Allegra so that they can pull a download from your machine for me to look at settings and AHI   Follow-up: - 2 week virtual visit with Waynetta Sandy (ok to double book with a shared decision slot)  Sleep Apnea Sleep apnea affects breathing during sleep. It causes breathing to stop for 10 seconds or more, or to become shallow. People with sleep apnea usually snore loudly. It can also increase the risk of: Heart attack. Stroke. Being very overweight (obese). Diabetes. Heart failure. Irregular heartbeat. High blood pressure. The goal of treatment is to help you breathe normally again. What are the causes?  The most common cause of this condition is a collapsed or blocked airway. There are three kinds of sleep apnea: Obstructive sleep apnea. This is caused by a blocked or collapsed airway. Central sleep apnea. This happens when the brain does not send the right signals to the muscles that control breathing. Mixed sleep apnea. This is a combination of obstructive and central sleep apnea. What increases the risk? Being overweight. Smoking. Having a small airway. Being older. Being male. Drinking alcohol. Taking medicines to calm yourself (sedatives or tranquilizers). Having family members with the condition. Having a tongue or tonsils that are larger than normal. What are the signs or symptoms? Trouble staying asleep. Loud snoring. Headaches in the morning. Waking up gasping. Dry mouth or sore throat in the morning. Being sleepy or tired during the day. If you are sleepy or tired during the  day, you may also: Not be able to focus your mind (concentrate). Forget things. Get angry a lot and have mood swings. Feel sad (depressed). Have changes in your personality. Have less interest in sex, if you are male. Be unable to have an erection, if you are male. How is this treated?  Sleeping on your side. Using a medicine to get rid of mucus in your nose (decongestant). Avoiding the use of alcohol, medicines to help you relax, or certain pain medicines (narcotics). Losing weight, if needed. Changing your diet. Quitting smoking. Using a machine to open your airway while you sleep, such as: An oral appliance. This is a mouthpiece that shifts your lower jaw forward. A CPAP device. This device blows air through a mask when you breathe out (exhale). An EPAP device. This has valves that you put in each nostril. A BIPAP device. This device blows air through a mask when you breathe in (inhale) and breathe out. Having surgery if other treatments do not work. Follow these instructions at home: Lifestyle Make changes that your doctor recommends. Eat a healthy diet. Lose weight if needed. Avoid alcohol, medicines to help you relax, and some pain medicines. Do not smoke or use any products that contain nicotine or tobacco. If you need help quitting, ask your doctor. General instructions Take over-the-counter and prescription medicines only as told by your doctor. If you were given a machine to use while you sleep, use it only as told by your doctor. If you are having surgery, make sure to tell your doctor you have sleep apnea. You may need to bring your device  with you. Keep all follow-up visits. Contact a doctor if: The machine that you were given to use during sleep bothers you or does not seem to be working. You do not get better. You get worse. Get help right away if: Your chest hurts. You have trouble breathing in enough air. You have an uncomfortable feeling in your back,  arms, or stomach. You have trouble talking. One side of your body feels weak. A part of your face is hanging down. These symptoms may be an emergency. Get help right away. Call your local emergency services (911 in the [place] Do not wait to see if the symptoms will go away. Do not drive yourself to the hospital. Summary This condition affects breathing during sleep. The most common cause is a collapsed or blocked airway. The goal of treatment is to help you breathe normally while you sleep. This information is not intended to replace advice given to you by your health care provider. Make sure you discuss any questions you have with your health care provider. Document Revised: 04/14/2021 Document Reviewed: 08/14/2020 Elsevier Patient Education  2024 [employer].

## 2023-02-27 ENCOUNTER — Ambulatory Visit (HOSPITAL_COMMUNITY): Payer: Medicaid Other | Admitting: Clinical

## 2023-02-28 ENCOUNTER — Other Ambulatory Visit: Payer: Self-pay | Admitting: Nurse Practitioner

## 2023-02-28 DIAGNOSIS — B001 Herpesviral vesicular dermatitis: Secondary | ICD-10-CM

## 2023-02-28 DIAGNOSIS — L209 Atopic dermatitis, unspecified: Secondary | ICD-10-CM

## 2023-02-28 DIAGNOSIS — T7840XD Allergy, unspecified, subsequent encounter: Secondary | ICD-10-CM

## 2023-02-28 MED ORDER — VALACYCLOVIR HCL 1 G PO TABS
1000.0000 mg | ORAL_TABLET | Freq: Two times a day (BID) | ORAL | 0 refills | Status: DC
Start: 2023-02-28 — End: 2023-08-03

## 2023-02-28 MED ORDER — FLUTICASONE PROPIONATE 50 MCG/ACT NA SUSP
2.0000 | Freq: Every day | NASAL | 5 refills | Status: DC
Start: 2023-02-28 — End: 2024-03-18

## 2023-02-28 MED ORDER — FLUTICASONE PROPIONATE 50 MCG/ACT NA SUSP
2.0000 | Freq: Every day | NASAL | 1 refills | Status: DC
Start: 2023-02-28 — End: 2023-02-28

## 2023-02-28 MED ORDER — TRIAMCINOLONE ACETONIDE 0.1 % EX CREA
1.0000 | TOPICAL_CREAM | Freq: Two times a day (BID) | CUTANEOUS | 2 refills | Status: DC | PRN
Start: 2023-02-28 — End: 2024-01-10

## 2023-03-06 ENCOUNTER — Telehealth (INDEPENDENT_AMBULATORY_CARE_PROVIDER_SITE_OTHER): Payer: Medicaid Other | Admitting: Primary Care

## 2023-03-06 DIAGNOSIS — G4733 Obstructive sleep apnea (adult) (pediatric): Secondary | ICD-10-CM

## 2023-03-06 NOTE — Progress Notes (Signed)
Reviewed and agree with assessment/plan.   Coralyn Helling, MD [facility] 03/06/2023, 1:03 PM Pager:  [phone]

## 2023-03-06 NOTE — Progress Notes (Signed)
Virtual Visit via Video Note  I connected with Stuart Harper on 03/06/23 at 11:30 AM EDT by a video enabled telemedicine application and verified that I am speaking with the correct person using two identifiers.  Location: Patient: Home Provider: Office    I discussed the limitations of evaluation and management by telemedicine and the availability of in person appointments. The patient expressed understanding and agreed to proceed.  History of Present Illness: 27 year old male, former smoker. PMH significant for anxiety disorder, polysubstance abuse, OSA, asthma, allergic rhinitis.   Previous [facility] pulmonary encounter: 02/22/2023 Patient had home sleep study in 2021 that showed evidence of moderate OSA, AHI 22/hour. He has been on CPAP for the last 3 years. SD card did not have data for review today. He is consistently wearing CPAP, average 8 hours a night. He noticed some benefit from the first 6-12 months. He has not been sleeping well for last year or two. Reports restless sleep as well as nightmares. He wakes up with cold a sweat and vivid nightmares 2-3 times a week. He will dream about what he has to do that week but states that it goes terribly wrong and there will be monsters. He has been told that he acts our his dreams and has been noted to talk in his sleep since he was a child. No concern for narcolepsy, cataplexy. No sleep walking.   He has tried melatonin in the past but not currently taking. Started on bupropion yesterday for anxiety/ADHD symptoms, he will see psychiatry back in 2 weeks.  He works evenings.  Typical bedtime is between 12 AM and 4 AM.  Takes him an hour to fall asleep.  He wakes up 1-3 times a night.  He starts his day between noon and 2 PM.  Weight is up 10 pounds.  CPAP pressure setting 5 to 20 cm H2O.   03/06/2023- Interim hx  Patient contacted today for 2 week visit OSA/REM sleep disorder. During our last visit he was started on Melatonin 10mg  for REM  sleep disorder. We received sleep study from 01/15/20 that showed patient has moderate OSA, AHI 22.9/hour with SpO2 low 82%. He contacted [employer] for CPAP download. 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 He is 77% compliance with CPAP use last 30 days. Average daily usage 5 hours 42 mins. Average pressure 95% percentile 8.0cm h20. Average AHI 2.0/hour.   He is unsure if he has noticed any significant change in sleep since starting Melatonin but sleep is no worse. He is still taking Dextromethorphan-Bupropion for anxiety/ADHD symptoms and will be following up with psychiatry tomorrow.   Observations/Objective:  Appear well without overt respiratory symptoms  Assessment and Plan:  OSA: - Stable; Patient has moderate OSA, AHI 22/hour - Sleep apnea is well controlled on auto settings; residual AHI 2.0/hour  - No changes recommended, advised patient continue to wear CPAP nightly 4-6 hours  REM sleep disorder: - Continue Melatonin 10mg  at bedtime. Advised patient keep sleep diary and maintain routine sleep scheduled. If nightmares/sleep disruptions do not improve in another 2-4 weeks we can switch to Clonazepam   Follow Up Instructions:  3 months or sooner if needed    I discussed the assessment and treatment plan with the patient. The patient was provided an opportunity to ask questions and all were answered. The patient agreed with the plan and demonstrated an understanding of the instructions.   The patient was advised to call back or seek an  in-person evaluation if the symptoms worsen or if the condition fails to improve as anticipated.  I provided 22 minutes of non-face-to-face time during this encounter.   [name], NP

## 2023-03-07 DIAGNOSIS — F411 Generalized anxiety disorder: Secondary | ICD-10-CM | POA: Diagnosis not present

## 2023-03-07 DIAGNOSIS — F32A Depression, unspecified: Secondary | ICD-10-CM | POA: Diagnosis not present

## 2023-03-07 NOTE — Telephone Encounter (Signed)
Download was available in office for visit on 03/06/23. Nothing further needed at this time.

## 2023-03-21 DIAGNOSIS — F411 Generalized anxiety disorder: Secondary | ICD-10-CM | POA: Diagnosis not present

## 2023-03-21 DIAGNOSIS — F6089 Other specific personality disorders: Secondary | ICD-10-CM | POA: Diagnosis not present

## 2023-03-21 DIAGNOSIS — F32A Depression, unspecified: Secondary | ICD-10-CM | POA: Diagnosis not present

## 2023-04-03 DIAGNOSIS — F411 Generalized anxiety disorder: Secondary | ICD-10-CM | POA: Diagnosis not present

## 2023-04-03 DIAGNOSIS — F32A Depression, unspecified: Secondary | ICD-10-CM | POA: Diagnosis not present

## 2023-05-15 DIAGNOSIS — F900 Attention-deficit hyperactivity disorder, predominantly inattentive type: Secondary | ICD-10-CM | POA: Diagnosis not present

## 2023-05-15 DIAGNOSIS — F411 Generalized anxiety disorder: Secondary | ICD-10-CM | POA: Diagnosis not present

## 2023-05-15 DIAGNOSIS — F32A Depression, unspecified: Secondary | ICD-10-CM | POA: Diagnosis not present

## 2023-06-15 DIAGNOSIS — F32A Depression, unspecified: Secondary | ICD-10-CM | POA: Diagnosis not present

## 2023-06-15 DIAGNOSIS — F411 Generalized anxiety disorder: Secondary | ICD-10-CM | POA: Diagnosis not present

## 2023-06-20 ENCOUNTER — Ambulatory Visit: Payer: Medicaid Other | Admitting: Family Medicine

## 2023-06-20 ENCOUNTER — Ambulatory Visit (INDEPENDENT_AMBULATORY_CARE_PROVIDER_SITE_OTHER): Payer: Medicaid Other

## 2023-06-20 ENCOUNTER — Encounter: Payer: Self-pay | Admitting: Family Medicine

## 2023-06-20 VITALS — BP 122/76 | HR 90 | Temp 97.7°F | Resp 16 | Ht 72.0 in | Wt 191.4 lb

## 2023-06-20 DIAGNOSIS — S6991XA Unspecified injury of right wrist, hand and finger(s), initial encounter: Secondary | ICD-10-CM

## 2023-06-20 DIAGNOSIS — S62346A Nondisplaced fracture of base of fifth metacarpal bone, right hand, initial encounter for closed fracture: Secondary | ICD-10-CM | POA: Diagnosis not present

## 2023-06-20 DIAGNOSIS — S62306A Unspecified fracture of fifth metacarpal bone, right hand, initial encounter for closed fracture: Secondary | ICD-10-CM | POA: Diagnosis not present

## 2023-06-20 NOTE — Progress Notes (Signed)
SUBJECTIVE:   Chief Complaint  Patient presents with   Hand Injury    Right hand punched the wall and been hurting 5 days   HPI Patient presents for acute visit  Discussed the use of AI scribe software for clinical note transcription with the patient, who gave verbal consent to proceed.  History of Present Illness The patient presented with a chief complaint of persistent pain and swelling in the right hand, specifically the pinky and side of the hand, following an incident where they punched a refrigerator out of anger five days prior. The pain has been constant since the incident and has not improved with time. The patient described the pain as severe, rating it a 7-8 out of 10.  The patient noted significant bruising and discoloration of the hand, and reported difficulty holding objects due to the pain. This is particularly concerning for the patient as their occupation involves heavy lifting on a farm.  The patient has been managing the pain with ibuprofen, which has not significantly reduced the swelling. They denied experiencing any numbness or tingling in the hand. The patient also reported that the pain does not radiate and is localized to the pinky and side of the hand.  The patient has not sought any other medical treatment for this issue prior to this consultation. The patient's friend helped them apply a makeshift bandage to the hand. The patient expressed a need for an x-ray to determine if there is a fracture.   PERTINENT PMH / PSH: As above  OBJECTIVE:  BP 122/76   Pulse 90   Temp 97.7 F (36.5 C)   Resp 16   Ht 6' (1.829 m)   Wt 191 lb 6 oz (86.8 kg)   SpO2 99%   BMI 25.96 kg/m    Physical Exam Vitals reviewed.  Constitutional:      General: He is not in acute distress.    Appearance: Normal appearance. He is normal weight. He is not ill-appearing, toxic-appearing or diaphoretic.  Cardiovascular:     Rate and Rhythm: Normal rate.  Pulmonary:     Effort:  Pulmonary effort is normal.  Musculoskeletal:        General: Swelling, tenderness and signs of injury present.     Left wrist: Tenderness present.     Left hand: Swelling and tenderness present. Decreased range of motion. Normal pulse.  Skin:    General: Skin is warm and dry.     Findings: Bruising present.  Neurological:     Mental Status: He is alert and oriented to person, place, and time. Mental status is at baseline.  Psychiatric:        Mood and Affect: Mood normal.        Behavior: Behavior normal.        Thought Content: Thought content normal.        Judgment: Judgment normal.        06/20/2023    9:53 AM 01/26/2023    2:16 PM 12/20/2022    1:20 PM 02/21/2022    3:09 PM 11/24/2020   11:15 AM  Depression screen PHQ 2/9  Decreased Interest 1 3  1 2   Down, Depressed, Hopeless 1 3  0 3  PHQ - 2 Score 2 6  1 5   Altered sleeping 2 3  1 3   Tired, decreased energy 2 3  1 2   Change in appetite 1 1  0 2  Feeling bad or failure about yourself  1 2  0 1  Trouble concentrating 1 2  0 1  Moving slowly or fidgety/restless 0 1  0 1  Suicidal thoughts 0 0  0 0  PHQ-9 Score 9 18  3 15   Difficult doing work/chores Somewhat difficult Very difficult  Not difficult at all Very difficult     Information is confidential and restricted. Go to Review Flowsheets to unlock data.      06/20/2023    9:53 AM 01/26/2023    2:17 PM 12/20/2022    1:20 PM 11/08/2019    4:16 PM  GAD 7 : Generalized Anxiety Score  Nervous, Anxious, on Edge 1 3  3   Control/stop worrying 1 3  3   Worry too much - different things 1 3  3   Trouble relaxing 1 3  3   Restless 0 1  3  Easily annoyed or irritable 1 1  3   Afraid - awful might happen 1 1  3   Total GAD 7 Score 6 15  21   Anxiety Difficulty Somewhat difficult Somewhat difficult  Very difficult     Information is confidential and restricted. Go to Review Flowsheets to unlock data.    ASSESSMENT/PLAN:  Injury of right hand, initial encounter Assessment &  Plan: Sustained from punching a fridge five days ago. Persistent pain, swelling, and bruising, particularly in the pinky and side of the hand. No numbness or tingling. Pain rated as 7-8/10. Suspect fracture given nature of injury. -Order right hand and wrist X-ray. -Refer to [facility] for further evaluation and management, including possible bracing.   Orders: -     DG Hand Complete Right; Future -     DG Wrist Complete Right; Future    PDMP reviewed  Return if symptoms worsen or fail to improve, for PCP.  Dana Allan, MD

## 2023-06-20 NOTE — Patient Instructions (Addendum)
It was a pleasure meeting you today. Thank you for allowing me to take part in your health care.  Our goals for today as we discussed include:  You have fractured one of your bones in your right hand and need to be seen by Orthopedics Go to [facility] for evaluation today [address], [place], [place] [address]  Follow up with PCP as needed  If you have any questions or concerns, please do not hesitate to call the office at [phone].  I look forward to our next visit and until then take care and stay safe.  Regards,   Dana Allan, MD   [facility]

## 2023-06-21 ENCOUNTER — Encounter: Payer: Self-pay | Admitting: Family Medicine

## 2023-06-27 DIAGNOSIS — S62346A Nondisplaced fracture of base of fifth metacarpal bone, right hand, initial encounter for closed fracture: Secondary | ICD-10-CM | POA: Diagnosis not present

## 2023-06-28 ENCOUNTER — Encounter: Payer: Self-pay | Admitting: Family Medicine

## 2023-06-28 DIAGNOSIS — S6991XA Unspecified injury of right wrist, hand and finger(s), initial encounter: Secondary | ICD-10-CM | POA: Insufficient documentation

## 2023-06-28 NOTE — Assessment & Plan Note (Addendum)
Sustained from punching a fridge five days ago. Persistent pain, swelling, and bruising, particularly in the pinky and side of the hand. No numbness or tingling. Pain rated as 7-8/10. Suspect fracture given nature of injury. -Order right hand and wrist X-ray. -Refer to [facility] for further evaluation and management, including possible bracing.

## 2023-07-13 DIAGNOSIS — F411 Generalized anxiety disorder: Secondary | ICD-10-CM | POA: Diagnosis not present

## 2023-07-13 DIAGNOSIS — F4312 Post-traumatic stress disorder, chronic: Secondary | ICD-10-CM | POA: Diagnosis not present

## 2023-07-13 DIAGNOSIS — F32A Depression, unspecified: Secondary | ICD-10-CM | POA: Diagnosis not present

## 2023-07-19 DIAGNOSIS — S62346A Nondisplaced fracture of base of fifth metacarpal bone, right hand, initial encounter for closed fracture: Secondary | ICD-10-CM | POA: Diagnosis not present

## 2023-08-03 ENCOUNTER — Other Ambulatory Visit: Payer: Self-pay | Admitting: Nurse Practitioner

## 2023-08-03 DIAGNOSIS — B001 Herpesviral vesicular dermatitis: Secondary | ICD-10-CM

## 2023-08-03 MED ORDER — VALACYCLOVIR HCL 1 G PO TABS: 1000.0000 mg | ORAL_TABLET | Freq: Two times a day (BID) | ORAL | 2 refills | Status: DC

## 2023-08-14 DIAGNOSIS — S62346A Nondisplaced fracture of base of fifth metacarpal bone, right hand, initial encounter for closed fracture: Secondary | ICD-10-CM | POA: Diagnosis not present

## 2023-08-29 ENCOUNTER — Encounter: Payer: Self-pay | Admitting: Nurse Practitioner

## 2023-08-29 ENCOUNTER — Encounter: Payer: Self-pay | Admitting: Family Medicine

## 2023-08-29 DIAGNOSIS — F4312 Post-traumatic stress disorder, chronic: Secondary | ICD-10-CM | POA: Diagnosis not present

## 2023-08-29 DIAGNOSIS — F32A Depression, unspecified: Secondary | ICD-10-CM | POA: Diagnosis not present

## 2023-08-29 DIAGNOSIS — F411 Generalized anxiety disorder: Secondary | ICD-10-CM | POA: Diagnosis not present

## 2023-09-06 ENCOUNTER — Ambulatory Visit: Payer: Medicaid Other | Admitting: Dermatology

## 2023-10-27 ENCOUNTER — Encounter: Payer: Self-pay | Admitting: Nurse Practitioner

## 2023-11-06 DIAGNOSIS — F411 Generalized anxiety disorder: Secondary | ICD-10-CM | POA: Diagnosis not present

## 2023-11-06 DIAGNOSIS — F32A Depression, unspecified: Secondary | ICD-10-CM | POA: Diagnosis not present

## 2023-11-20 NOTE — Telephone Encounter (Signed)
 Left message for patient to give our office a call back to offer an appointment today at 2:40 PM with Narendra.

## 2023-11-22 ENCOUNTER — Encounter: Payer: Self-pay | Admitting: Family

## 2023-11-22 ENCOUNTER — Ambulatory Visit: Admitting: Family

## 2023-11-22 VITALS — BP 130/70 | HR 90 | Temp 98.3°F | Ht 72.0 in | Wt 203.6 lb

## 2023-11-22 DIAGNOSIS — L03032 Cellulitis of left toe: Secondary | ICD-10-CM | POA: Diagnosis not present

## 2023-11-22 MED ORDER — CEPHALEXIN 500 MG PO CAPS
500.0000 mg | ORAL_CAPSULE | Freq: Four times a day (QID) | ORAL | 0 refills | Status: AC
Start: 2023-11-22 — End: 2023-11-27

## 2023-11-22 MED ORDER — MUPIROCIN 2 % EX OINT
1.0000 | TOPICAL_OINTMENT | Freq: Two times a day (BID) | CUTANEOUS | 2 refills | Status: AC
Start: 2023-11-22 — End: ?

## 2023-11-22 NOTE — Progress Notes (Signed)
 Assessment & Plan:  Paronychia of great toe, left Assessment & Plan: Presentation c/w paronychia.  Start warm soaks followed by Bactroban. If not resolved, prescribed Keflex to cover for MSSA. If persists or recurs, will arrange podiatry consult. He politely declined today.   Orders: -     Mupirocin; Apply 1 Application topically 2 (two) times daily.  Dispense: 22 g; Refill: 2 -     Cephalexin; Take 1 capsule (500 mg total) by mouth 4 (four) times daily for 5 days.  Dispense: 20 capsule; Refill: 0     Return precautions given.   Risks, benefits, and alternatives of the medications and treatment plan prescribed today were discussed, and patient expressed understanding.   Education regarding symptom management and diagnosis given to patient on AVS either electronically or printed.  No follow-ups on file.  Rennie Plowman, FNP  Subjective:    Patient ID: Stuart Harper, male    DOB: 10/02/1995, 28 y.o.   MRN: [id number]  CC: Stuart Harper Stuart Harper is a 28 y.o. male who presents today for an acute visit.    HPI: Complains of pain and redness of  left great toe medial side of toenail over the past couple of weeks.  Purulent discharge expressed last week, resolved Washing foot and topical OTC antibiotic .  Denies F, chills.   Remote toe injury of left toe one year after slipping with flip flops.    No recent abx    chlorhexidine  Allergies: Shellfish allergy Current Outpatient Medications on File Prior to Visit  Medication Sig Dispense Refill   clotrimazole-betamethasone (LOTRISONE) cream Apply 1 Application topically 2 (two) times daily. X 1 week. 30 g 0   Dextromethorphan-buPROPion ER (AUVELITY) 45-105 MG TBCR Take 1 tablet by mouth in the morning and at bedtime.     fluticasone (FLONASE) 50 MCG/ACT nasal spray Place 2 sprays into both nostrils daily. 16 g 5   Melatonin 10 MG TABS Take 1/2 tablet (5mg ) at bedtime for 3 nights; then increase to 1 tablet  (10mg ) at bedtime 30 tablet 0   prazosin (MINIPRESS) 1 MG capsule Take 1 mg by mouth at bedtime.     prazosin (MINIPRESS) 2 MG capsule Take 4 mg by mouth at bedtime.     QUEtiapine (SEROQUEL) 25 MG tablet Take 25-50 mg by mouth at bedtime.     triamcinolone cream (KENALOG) 0.1 % Apply 1 Application topically 2 (two) times daily as needed. 30 g 2   valACYclovir (VALTREX) 1000 MG tablet Take 1 tablet (1,000 mg total) by mouth 2 (two) times daily. X 1 day. Use as needed for breakouts, start ASAP after symptom onset. 30 tablet 2   VYVANSE 50 MG capsule Take 50 mg by mouth every morning.     No current facility-administered medications on file prior to visit.    Review of Systems  Constitutional:  Negative for chills and fever.  Respiratory:  Negative for cough.   Cardiovascular:  Negative for chest pain, palpitations and leg swelling.  Gastrointestinal:  Negative for nausea and vomiting.  Skin:  Positive for wound.      Objective:    BP 130/70   Pulse 90   Temp 98.3 F (36.8 C) (Oral)   Ht 6' (1.829 m)   Wt 203 lb 9.6 oz (92.4 kg)   SpO2 96%   BMI 27.61 kg/m   BP Readings from Last 3 Encounters:  11/22/23 130/70  06/20/23 122/76  02/22/23 132/84   Wt Readings  from Last 3 Encounters:  11/22/23 203 lb 9.6 oz (92.4 kg)  06/20/23 191 lb 6 oz (86.8 kg)  02/22/23 191 lb 9.6 oz (86.9 kg)    Physical Exam Vitals reviewed.  Constitutional:      Appearance: He is well-developed.  Cardiovascular:     Rate and Rhythm: Regular rhythm.     Heart sounds: Normal heart sounds.     Comments: Palpable pedal pulses bilaterally Pulmonary:     Effort: Pulmonary effort is normal. No respiratory distress.     Breath sounds: Normal breath sounds. No wheezing, rhonchi or rales.  Musculoskeletal:       Feet:  Feet:     Comments: Erythema without tenderness left medial great toe bed. No purulent discharge, red streaks.  Skin:    General: Skin is warm and dry.  Neurological:     Mental  Status: He is alert.  Psychiatric:        Speech: Speech normal.        Behavior: Behavior normal.

## 2023-11-22 NOTE — Assessment & Plan Note (Signed)
 Presentation c/w paronychia.  Start warm soaks followed by Bactroban. If not resolved, prescribed Keflex to cover for MSSA. If persists or recurs, will arrange podiatry consult. He politely declined today.

## 2023-11-22 NOTE — Patient Instructions (Addendum)
 For pain, you may soak your foot in a warm soak and then apply Bactroban ointment.   If doesn't improve , you may then start oral antibiotic, keflex.   Ensure to take probiotics while on antibiotics and also for 2 weeks after completion. This can either be by eating yogurt daily or taking a probiotic supplement over the counter such as Culturelle.It is important to re-colonize the gut with good bacteria and also to prevent any diarrheal infections associated with antibiotic use.    Let me know if you would like to see a podiatrist.   Paronychia Paronychia is an infection of the skin. It happens near a fingernail or toenail. It may cause pain and swelling around the nail. In some cases, a fluid-filled bump (abscess) can form near or under the nail. Often, this condition is not serious, and it clears up with treatment. What are the causes? This condition may be caused by a germ. The germ may be bacteria or a fungus. These germs can enter the body through an opening in the skin, such as a cut or a hangnail. Other causes include: Repeated injuries to your fingernails or toenails. Irritation of the base and sides of the nail (cuticle). What increases the risk? This condition is more likely to develop in people who: Get their hands wet often, such as a dishwasher. Bite their fingernails or the base and sides of their nails. Have other skin problems. Have hangnails or hurt fingertips. Come into contact with chemicals like detergents. Have diabetes. What are the signs or symptoms? Redness and swelling of the skin near the nail. A tender feeling around the nail. Pus-filled bumps under the skin at the base and sides of the nail. Fluid or pus under the nail. Pain in the area. How is this treated? Treatment depends on the cause of your condition and how bad it is. If your condition is mild, it may clear up on its own in a few days or after soaking in warm water. If needed, treatment may  include: Antibiotic medicine. Antifungal medicine. A procedure to drain pus from a fluid-filled bump. Medicine to treat irritation and swelling (corticosteroids). Taking off part of an ingrown toenail. A bandage (dressing) may be placed over the nail area. Follow these instructions at home: Wound care Keep the affected area clean. Soak the fingers or toes in warm water as told by your doctor. You may be told to do this for 20 minutes, 2-3 times a day. Keep the area dry when you are not soaking it. Do not try to drain a fluid-filled bump on your own. Follow instructions from your doctor about how to take care of the affected area. Make sure you: Wash your hands with soap and water for at least 20 seconds before and after you change your bandage. If you cannot use soap and water, use hand sanitizer. Change your bandage as told by your doctor. If you had a fluid-filled bump and your doctor drained it, check the area every day for signs of infection. Check for: Redness, swelling, or pain. Fluid or blood. Warmth. Pus or a bad smell. Medicines  Take over-the-counter and prescription medicines only as told by your doctor. If you were prescribed an antibiotic medicine, take it as told by your doctor. Do not stop taking it even if you start to feel better. General instructions Avoid contact with anything that irritates your skin or that you are allergic to. Do not pick at the affected area. Keep all  follow-up visits. Prevention To prevent this condition from happening again: Wear rubber gloves when putting your hands in water for washing dishes or other tasks. Wear gloves if your hands might touch cleaners or chemicals. Avoid injuring your nails or fingertips. Do not bite your nails or tear hangnails. Do not cut your nails very short. Do not cut the skin at the base and sides of the nail. Use clean nail clippers or scissors when trimming nails. Contact a doctor if: You feel worse. You  do not get better. You keep having or you have more fluid, blood, or pus coming from the affected area. Your affected finger, toe, or joint gets swollen or hard to move. You have a fever or chills. There is redness spreading from the affected area. Summary Paronychia is an infection of the skin. It happens near a fingernail or toenail. This condition may cause pain and swelling around the nail. Soak the fingers or toes in warm water as told by your doctor. Often, this condition is not serious, and it clears up with treatment. This information is not intended to replace advice given to you by your health care provider. Make sure you discuss any questions you have with your health care provider. Document Revised: 12/07/2020 Document Reviewed: 12/07/2020 Elsevier Patient Education  2024 [employer].

## 2023-12-28 DIAGNOSIS — F333 Major depressive disorder, recurrent, severe with psychotic symptoms: Secondary | ICD-10-CM | POA: Diagnosis not present

## 2023-12-28 DIAGNOSIS — F411 Generalized anxiety disorder: Secondary | ICD-10-CM | POA: Diagnosis not present

## 2024-01-08 ENCOUNTER — Telehealth: Payer: Self-pay

## 2024-01-08 ENCOUNTER — Ambulatory Visit: Payer: Self-pay

## 2024-01-08 NOTE — Telephone Encounter (Signed)
 Spoke with pt and scheduled him for tomorrow morning with Dr. Casimir Cleaver.

## 2024-01-08 NOTE — Telephone Encounter (Signed)
 Spoke with pt and got him scheduled tomorrow with Dr. Casimir Cleaver at 9 am.

## 2024-01-08 NOTE — Telephone Encounter (Signed)
 Tired to call pt to check on state and get more info did not leave a 2nd vm

## 2024-01-08 NOTE — Telephone Encounter (Signed)
 Detailed VM left informing pt he needs to be seen sooner and that Dr. Casimir Cleaver has openings   E2C2 PLEASE SCHEDULE PT WITH DR. BAIR IN THE AM AND OR SEND HIM TO TRIAGE PER MESSAGE BELOW

## 2024-01-08 NOTE — Telephone Encounter (Signed)
 Chief Complaint: weakness Symptoms: weakness, fatigue, sexual dysfunction, brain fog Frequency: since Sunday 4/13 Pertinent Negatives: Patient denies CP, SOB, N/V, palpitations, falls Disposition: [] ED /[] Urgent Care (no appt availability in office) / [x] Appointment(In office/virtual)/ []  [facility]/ [] Home Care/ [] Refused Recommended Disposition /[] [facility]/ []  Follow-up with PCP Additional Notes: Pt reports weakness, fatigue, brain fog, and sexual dysfunction since 4/13. Pt states his psychiatrist increased his dosage of Fanapt and his symptoms began around that time. Pt took Fanapt last on Wednesday or Thursday last week d/t side effects and alerted his psychiatrist. Still, pt is not feeling normal. Pt reports items feel twice as heavy to him, he works on a farm. Pt states "standing and walking feels different." Pt is still able to walk normally. Pt endorses intermittent dizziness with carrying heavy objects at work. Pt endorses brain fog, states having  conversations is more difficult because talking to others does not feel as easy. Pt reports fatigue and states he is sleeping more. Pt reports sexual dysfunction; he states he has not been able to ejaculate during sex. RN advised pt he should be seen as soon as possible. RN scheduled pt for Wednesday. Pt had initially made an appt for 4/29. RN advised pt that if he develops one-sided weakness, CP, SOB, N/V, an unsteady gait, rectal bleeding, or falls he needs to go to the ED. Pt verbalized understanding.    Copied from CRM [id number]. Topic: Clinical - Red Word Triage >> Jan 08, 2024  4:51 PM Chuck Crater wrote: [facility] that prompted transfer to Nurse Triage: Patient is having extreme weakness, lethargy, I'm cloudy minded, and having sexual dysfunction Reason for Disposition  [1] MILD weakness (i.e., does not interfere with ability to work, go to school, normal activities) AND [2] persists > 1 week  Answer Assessment -  Initial Assessment Questions 1. 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 SEVERITY: "How bad is it?"  "Can you stand and walk?"   - MILD (0-3): Feels weak or tired, but does not interfere with work, school or normal activities.   - MODERATE (4-7): Able to stand and walk; weakness interferes with work, school, or normal activities.   - SEVERE (8-10): Unable to stand or walk; unable to do usual activities.     Pt states "standing and walking feels different", "feels heavier", "harder to walk around." Pt denies falls. Pt states his weakness is interfering with normal activities. 3. ONSET: "When did these symptoms begin?" (e.g., hours, days, weeks, months)     Symptoms started last Sunday 4/13 - hit him suddenly 4. CAUSE: "What do you think is causing the weakness or fatigue?" (e.g., not drinking enough fluids, medical problem, trouble sleeping)     Not sure  5. NEW MEDICINES:  "Have you started on any new medicines recently?" (e.g., opioid pain medicines, benzodiazepines, muscle relaxants, antidepressants, antihistamines, neuroleptics, beta blockers)     Increased one of his psych meds (Fanapt), states symptoms started around the same time 6. OTHER SYMPTOMS: "Do you have any other symptoms?" (e.g., chest pain, fever, cough, SOB, vomiting, diarrhea, bleeding, other areas of pain)     Able to eat and drink normally. Pt states his psychiatrist increased one of his medications (fanapt) and he feels his symptoms started around the same time. Pt was then taken off that medicine (he alerted his psychiatrist) but  pt still does not feel right. Pt last took that medication Wednesday or Thursday. Pt endorses lethargy and is sleeping more often. Pt states he has nightmares, is going to see a sleep specialist. States he is not sleeping well. Pt states he is cloudy-minded and does not feel as  sharp. Pt states when he is talking to others it does not feel "fluid" or "easy." Pt states he can "have normal sex still", but nothing comes out. Denies CP or SOB. Endorses "a little" dizziness that occurs randomly, worse with working and moving heavy items. Denies vomiting and diarrhea. Denies rectal bleeding or bloody stools. States he has been very congested since symptoms started, has a hx of allergies. Flonase  not helpful. States he has nasal congestion. Endorses "a little" cough. Drainage is clear or yellow/brown. Is eating or drinking normally. Denies heart palpitations.  Protocols used: Weakness (Generalized) and Fatigue-A-AH

## 2024-01-08 NOTE — Telephone Encounter (Signed)
 LMTCB

## 2024-01-08 NOTE — Telephone Encounter (Signed)
 Patient scheduled an appointment on 01/16/2024 with [name], NP, with the following comment:  "Patient comments: I'm having extreme weakness and lethargy. I'm cloudy minded. Having sexual dysfunction"  I left a voicemail for patient asking him to please call us  and let us  know if he would like to come in today to be seen, as we have appointments available at this time, or if he would like to speak with a triage nurse.  E2C2 - when patient calls back, please let him speak with triage nurse or schedule an appointment for him to be seen.

## 2024-01-08 NOTE — Telephone Encounter (Signed)
 Patient is needing a call a bck regarding the call he gotten from the office

## 2024-01-09 ENCOUNTER — Ambulatory Visit

## 2024-01-10 ENCOUNTER — Ambulatory Visit

## 2024-01-10 ENCOUNTER — Ambulatory Visit: Admitting: Family Medicine

## 2024-01-10 VITALS — BP 120/78 | HR 97 | Temp 98.6°F | Ht 72.0 in | Wt 198.8 lb

## 2024-01-10 DIAGNOSIS — B001 Herpesviral vesicular dermatitis: Secondary | ICD-10-CM | POA: Diagnosis not present

## 2024-01-10 DIAGNOSIS — L209 Atopic dermatitis, unspecified: Secondary | ICD-10-CM | POA: Diagnosis not present

## 2024-01-10 DIAGNOSIS — R531 Weakness: Secondary | ICD-10-CM | POA: Diagnosis not present

## 2024-01-10 DIAGNOSIS — R5383 Other fatigue: Secondary | ICD-10-CM

## 2024-01-10 LAB — CBC WITH DIFFERENTIAL/PLATELET
Basophils Absolute: 0 10*3/uL (ref 0.0–0.1)
Basophils Relative: 0.5 % (ref 0.0–3.0)
Eosinophils Absolute: 0.1 10*3/uL (ref 0.0–0.7)
Eosinophils Relative: 2.8 % (ref 0.0–5.0)
HCT: 44.7 % (ref 39.0–52.0)
Hemoglobin: 14.9 g/dL (ref 13.0–17.0)
Lymphocytes Relative: 35.3 % (ref 12.0–46.0)
Lymphs Abs: 1.9 10*3/uL (ref 0.7–4.0)
MCHC: 33.3 g/dL (ref 30.0–36.0)
MCV: 90 fl (ref 78.0–100.0)
Monocytes Absolute: 0.6 10*3/uL (ref 0.1–1.0)
Monocytes Relative: 10.6 % (ref 3.0–12.0)
Neutro Abs: 2.7 10*3/uL (ref 1.4–7.7)
Neutrophils Relative %: 50.8 % (ref 43.0–77.0)
Platelets: 165 10*3/uL (ref 150.0–400.0)
RBC: 4.96 Mil/uL (ref 4.22–5.81)
RDW: 12.5 % (ref 11.5–15.5)
WBC: 5.3 10*3/uL (ref 4.0–10.5)

## 2024-01-10 LAB — COMPREHENSIVE METABOLIC PANEL WITH GFR
ALT: 39 U/L (ref 0–53)
AST: 23 U/L (ref 0–37)
Albumin: 4.6 g/dL (ref 3.5–5.2)
Alkaline Phosphatase: 68 U/L (ref 39–117)
BUN: 10 mg/dL (ref 6–23)
CO2: 31 meq/L (ref 19–32)
Calcium: 9.5 mg/dL (ref 8.4–10.5)
Chloride: 100 meq/L (ref 96–112)
Creatinine, Ser: 0.83 mg/dL (ref 0.40–1.50)
GFR: 118.41 mL/min (ref >60.00)
Glucose, Bld: 72 mg/dL (ref 70–99)
Potassium: 4.1 meq/L (ref 3.5–5.1)
Sodium: 139 meq/L (ref 135–145)
Total Bilirubin: 0.5 mg/dL (ref 0.2–1.2)
Total Protein: 7 g/dL (ref 6.0–8.3)

## 2024-01-10 LAB — VITAMIN D 25 HYDROXY (VIT D DEFICIENCY, FRACTURES): VITD: 24.66 ng/mL — ABNORMAL LOW (ref 30.00–100.00)

## 2024-01-10 LAB — TSH: TSH: 4.68 u[IU]/mL (ref 0.35–5.50)

## 2024-01-10 LAB — HEMOGLOBIN A1C: Hgb A1c MFr Bld: 5.3 % (ref 4.6–6.5)

## 2024-01-10 MED ORDER — TRIAMCINOLONE ACETONIDE 0.1 % EX CREA
1.0000 | TOPICAL_CREAM | Freq: Two times a day (BID) | CUTANEOUS | 2 refills | Status: AC | PRN
Start: 2024-01-10 — End: ?

## 2024-01-10 MED ORDER — VALACYCLOVIR HCL 1 G PO TABS: 1000.0000 mg | ORAL_TABLET | Freq: Two times a day (BID) | ORAL | 2 refills | Status: DC

## 2024-01-10 NOTE — Assessment & Plan Note (Signed)
-   Stable on prn Valtrex  , following refill sent: Valtrex : 1000 MG tablet; Take 1 tablet (1,000 mg total) by mouth 2 (two) times daily. X 1 day. Use as needed for breakouts, start ASAP after symptom onset.  Dispense: 30 tablet; Refill: 2

## 2024-01-10 NOTE — Patient Instructions (Addendum)
-   Increase daily water intake to 60 OZ per day.  - Stay off of alcohol. - Use nasal Flonase , twice a day for 7 days then once a day. Please start taking either Allegra or Claritin, these are antihistamine medication that cal help with allergy. You can get these over the counter.  - Keep f/u with Kacy that is scheduled for 01/16/24. Please reach out to your psychiatrist and sleep specialist to f/u for sleep disorder.

## 2024-01-10 NOTE — Progress Notes (Addendum)
 Acute Office Visit  Subjective:    Patient ID: Stuart Harper, male    DOB: Feb 19, 1996, 28 y.o.   MRN: [id number]  Chief Complaint  Patient presents with   Fatigue   Insomnia   HPI Established patient of [name], NP presenting for following acute concerns:  1) One week h/o low energy, mental fog/cloudy that comes and goes. Patient reports his mood has been low and irritable. Appetite has been unchanged. Sleep has been getting worse, getting more vivid dreams and night sweats.  Patient denies unintentional fever, chills. No nausea, vomiting, dizziness. No thought of SI/HI. Diet: Mixed of fast food, and home made food. Carbs and meat. Works at [employer].   Patient has h/o REM sleep behavior disorder, OSA and has seen [name] with [facility] pulmonary care. He also uses CPAP and is getting about 6 hours of sleep at night.    Patient has h/o MDD, polysubstance dependence, alchohol use disorder, panic disorder for which he sees [place] E. Simon, PA-C. He is currently on Auvelity, Prazosin. Not taking Melatonin consistently. On 6 mg at night, Prazosin. On Seroquel , on 25 mg, has not taken in about a month. His psychiatrist told him to stay off this medication. Last appointment with his psychiatrist: about a week ago. He had video visit with his psych on last Thursday. He was started on Fanapt by his psychiatrist and reports he started to notice symptoms when Fanapt was increased in dose. He has been off from this medication.  No Restlessness, strong desire to move.  No tremors, no neck spasm.    2) Has seasonal allergy, and has been using Flonase  daily.   3) H/O eczema: Needs refill on eczema: Kenalog , uses as needed. Has noted some new spots on neck and elbows. Only uses Kenolog prn.   4) Cold sores: On Valtrex  1,000 mg. Last cold sore was about one week ago needs refill. Needs refill   ROS As per HPI    Objective:    BP 120/78   Pulse 97   Temp 98.6 F (37 C) (Oral)    Ht 6' (1.829 m)   Wt 198 lb 12.8 oz (90.2 kg)   SpO2 95%   BMI 26.96 kg/m    Physical Exam HENT:     Head: Normocephalic and atraumatic.     Right Ear: Tympanic membrane is not perforated, erythematous or bulging.     Left Ear: Tympanic membrane is bulging. Tympanic membrane is not perforated or erythematous.     Mouth/Throat:     Mouth: Mucous membranes are moist.     Pharynx: No oropharyngeal exudate.  Eyes:     General: No scleral icterus. Cardiovascular:     Rate and Rhythm: Normal rate.  Pulmonary:     Effort: Pulmonary effort is normal.     Breath sounds: No wheezing.  Abdominal:     General: Bowel sounds are normal. There is no distension.     Palpations: Abdomen is soft.     Tenderness: There is no guarding.  Musculoskeletal:     Cervical back: Normal range of motion and neck supple. No rigidity.     Right lower leg: No edema.     Left lower leg: No edema.  Skin:    General: Skin is warm.  Neurological:     Mental Status: He is alert and oriented to person, place, and time.  Psychiatric:        Mood and Affect: Mood normal.  Results for orders placed or performed in visit on 01/10/24  CBC w/Diff  Result Value Ref Range   WBC 5.3 4.0 - 10.5 K/uL   RBC 4.96 4.22 - 5.81 Mil/uL   Hemoglobin 14.9 13.0 - 17.0 g/dL   HCT 16.1 09.6 - 04.5 %   MCV 90.0 78.0 - 100.0 fl   MCHC 33.3 30.0 - 36.0 g/dL   RDW 40.9 81.1 - 91.4 %   Platelets 165.0 150.0 - 400.0 K/uL   Neutrophils Relative % 50.8 43.0 - 77.0 %   Lymphocytes Relative 35.3 12.0 - 46.0 %   Monocytes Relative 10.6 3.0 - 12.0 %   Eosinophils Relative 2.8 0.0 - 5.0 %   Basophils Relative 0.5 0.0 - 3.0 %   Neutro Abs 2.7 1.4 - 7.7 K/uL   Lymphs Abs 1.9 0.7 - 4.0 K/uL   Monocytes Absolute 0.6 0.1 - 1.0 K/uL   Eosinophils Absolute 0.1 0.0 - 0.7 K/uL   Basophils Absolute 0.0 0.0 - 0.1 K/uL  Comp Met (CMET)  Result Value Ref Range   Sodium 139 135 - 145 mEq/L   Potassium 4.1 3.5 - 5.1 mEq/L   Chloride 100  96 - 112 mEq/L   CO2 31 19 - 32 mEq/L   Glucose, Bld 72 70 - 99 mg/dL   BUN 10 6 - 23 mg/dL   Creatinine, Ser 7.82 0.40 - 1.50 mg/dL   Total Bilirubin 0.5 0.2 - 1.2 mg/dL   Alkaline Phosphatase 68 39 - 117 U/L   AST 23 0 - 37 U/L   ALT 39 0 - 53 U/L   Total Protein 7.0 6.0 - 8.3 g/dL   Albumin 4.6 3.5 - 5.2 g/dL   GFR 956.21 >30.86 mL/min   Calcium 9.5 8.4 - 10.5 mg/dL  TSH  Result Value Ref Range   TSH 4.68 0.35 - 5.50 uIU/mL  Vitamin D  (25 hydroxy)  Result Value Ref Range   VITD 24.66 (L) 30.00 - 100.00 ng/mL  Iron, TIBC and Ferritin Panel  Result Value Ref Range   Iron 96 50 - 195 mcg/dL   TIBC 578 469 - 629 mcg/dL (calc)   %SAT 28 20 - 48 % (calc)   Ferritin 66 38 - 380 ng/mL  HgB A1c  Result Value Ref Range   Hgb A1c MFr Bld 5.3 4.6 - 6.5 %  Testosterone ,Free and Total  Result Value Ref Range   Testosterone  47 (L) 264 - 916 ng/dL   Testosterone , Free 1.3 (L) 9.3 - 26.5 pg/mL       Assessment & Plan:  Other fatigue Assessment & Plan: D/D includes anemia, vitamin deficiency, thyroid  disorder, hormone disorder. We will obtain the following, management pending results. I also recommend patient schedule a f/u with sleep specialist to adjust medication as needed to improve sleep. I also counsel patient to schedule an appointment with his psychiatrist to help with ongoing mood disorder. Increase daily water intake to 60 OZ per day, sleep hygiene discussed, counseled on not drinking alcohol as well.  - CBC w/Diff - Comp Met (CMET) - TSH - Vitamin D  (25 hydroxy) - Iron, TIBC and Ferritin Panel - HgB A1c - Testosterone ,Free and Total     Orders: -     CBC with Differential/Platelet -     Comprehensive metabolic panel with GFR -     TSH -     VITAMIN D  25 Hydroxy (Vit-D Deficiency, Fractures) -     Iron, TIBC and Ferritin Panel -  Hemoglobin A1c -     Testosterone ,Free and Total  Cold sore Assessment & Plan: - Stable on prn Valtrex  , following refill  sent: Valtrex : 1000 MG tablet; Take 1 tablet (1,000 mg total) by mouth 2 (two) times daily. X 1 day. Use as needed for breakouts, start ASAP after symptom onset.  Dispense: 30 tablet; Refill: 2    Orders: -     valACYclovir  HCl; Take 1 tablet (1,000 mg total) by mouth 2 (two) times daily. X 1 day. Use as needed for breakouts, start ASAP after symptom onset.  Dispense: 30 tablet; Refill: 2  Atopic dermatitis, unspecified type Assessment & Plan: - Symptoms controlled on prn triamcinolone  cream 0.1 %. Apply 1 Application topically 2 (two) times daily as needed.  Dispense: 30 g; Refill: 2   Orders: -     Triamcinolone  Acetonide; Apply 1 Application topically 2 (two) times daily as needed.  Dispense: 30 g; Refill: 2  Weakness Assessment & Plan: D/D includes anemia, vitamin deficiency, thyroid  disorder, hormone disorder. We will obtain the following, management pending results. I also recommend patient schedule a f/u with sleep specialist to adjust medication as needed to improve sleep. I also counsel patient to schedule an appointment with his psychiatrist to help with ongoing mood disorder. Increase daily water intake to 60 OZ per day, sleep hygiene discussed, counseled on not drinking alcohol as well.  - CBC w/Diff - Comp Met (CMET) - TSH - Vitamin D  (25 hydroxy) - Iron, TIBC and Ferritin Panel - HgB A1c - Testosterone ,Free and Total      Low energy Assessment & Plan: Defer to plan per weakness    Return in about 2 weeks (around 01/24/2024) for WITH PCP FOR CHRONIC ISSUES.  Jacklin Mascot, MD

## 2024-01-10 NOTE — Assessment & Plan Note (Signed)
 D/D includes anemia, vitamin deficiency, thyroid disorder, hormone disorder. We will obtain the following, management pending results. I also recommend patient schedule a f/u with sleep specialist to adjust medication as needed to improve sleep. I also counsel patient to schedule an appointment with his psychiatrist to help with ongoing mood disorder. Increase daily water intake to 60 OZ per day, sleep hygiene discussed, counseled on not drinking alcohol as well.  - CBC w/Diff - Comp Met (CMET) - TSH - Vitamin D  (25 hydroxy) - Iron, TIBC and Ferritin Panel - HgB A1c - Testosterone ,Free and Total

## 2024-01-10 NOTE — Assessment & Plan Note (Signed)
-   Symptoms controlled on prn triamcinolone  cream 0.1 %. Apply 1 Application topically 2 (two) times daily as needed.  Dispense: 30 g; Refill: 2

## 2024-01-11 ENCOUNTER — Telehealth: Payer: Self-pay

## 2024-01-11 LAB — IRON,TIBC AND FERRITIN PANEL
%SAT: 28 % (ref 20–48)
Ferritin: 66 ng/mL (ref 38–380)
Iron: 96 ug/dL (ref 50–195)
TIBC: 341 ug/dL (ref 250–425)

## 2024-01-11 MED ORDER — CLONAZEPAM 0.5 MG PO TABS
0.5000 mg | ORAL_TABLET | Freq: Two times a day (BID) | ORAL | 0 refills | Status: DC | PRN
Start: 2024-01-11 — End: 2024-01-26

## 2024-01-11 NOTE — Telephone Encounter (Signed)
 Copied from CRM [id number]. Topic: Clinical - Medication Question >> Jan 10, 2024 11:09 AM Crist Dominion wrote: Reason for CRM: Patient would like to speak with Shearon Denis nurse regarding his reoccurring night terrors and sleep disturbances, as patient states he tried the melatonin as recommended by Ms.Sueanne Emerald but it has not helped at all. Patient is requesting advice on a medication alternative prior to trying the Clonazepam  as that is his last resort,but states if there is no other alternative he will try the Clonazepam . Patient also states he saw his psych and primary care doctor this week and has pending blood work.   Please advise meds.

## 2024-01-11 NOTE — Progress Notes (Signed)
 Noted, pending result for free testosterone . Will reach out to the patient once that result is back.   Jacklin Mascot, MD

## 2024-01-11 NOTE — Telephone Encounter (Signed)
 If melatonin did not work, recommend medication is Clonazepam . I will send in RX for clonazepam  0.5mg  at bedtime  Needs FU, overdue in 2-4 weeks preferably, can be virtual

## 2024-01-12 ENCOUNTER — Other Ambulatory Visit: Payer: Self-pay

## 2024-01-12 ENCOUNTER — Telehealth: Payer: Self-pay | Admitting: Nurse Practitioner

## 2024-01-12 DIAGNOSIS — R7989 Other specified abnormal findings of blood chemistry: Secondary | ICD-10-CM | POA: Insufficient documentation

## 2024-01-12 LAB — TESTOSTERONE,FREE AND TOTAL
Testosterone, Free: 1.3 pg/mL — ABNORMAL LOW (ref 9.3–26.5)
Testosterone: 47 ng/dL — ABNORMAL LOW (ref 264–916)

## 2024-01-12 NOTE — Progress Notes (Signed)
 Called patient to discuss recent lab results and recommendations per Dr Casimir Cleaver. Left message to give our office a call back.  OK for E2C2 to give result note if pt calls back. If relayed, please notify the office.

## 2024-01-12 NOTE — Telephone Encounter (Signed)
 Called patient to discuss recent results and recommendations. Left message to give our office a call back to discuss.  OK for E2C2 to give result note if pt calls back. If relayed, please notify the office.

## 2024-01-12 NOTE — Telephone Encounter (Signed)
 Copied from CRM [id number]. Topic: Clinical - Prescription Issue >> Jan 12, 2024  3:57 PM Stuart Harper wrote: Reason for CRM: PT IS CALLING ABOUT HIS Testosterone  TEST RESULTS AND REQUESTING MEDS FOR IT BEING SO LOW. STATED DR Casimir Cleaver HAS NOT READ HIS RESULTS YET.

## 2024-01-12 NOTE — Progress Notes (Signed)
 1. 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  Component     Latest Ref Rng 01/10/2024  Testosterone      264 - 916 ng/dL 47 (L)   Testosterone  Free     9.3 - 26.5 pg/mL 1.3 (L)      Jacklin Mascot, MD

## 2024-01-12 NOTE — Progress Notes (Signed)
 Please let the patient know he has vitamin D  insufficiency, I recommend he start taking over the counter vitamin d  supplement 1,000 U daily. He also has low testosterone ,  I recommend referral to endocrinology for further evaluation and treatment for low testosterone . Otherwise his CBC, liver, kidney, thyroid function, blood glucose, and iron level all came back normal. I recommend he continue follow up with his PCP as scheduled for chronic issues management.   Thank you,  Jacklin Mascot, MD

## 2024-01-15 DIAGNOSIS — R7989 Other specified abnormal findings of blood chemistry: Secondary | ICD-10-CM

## 2024-01-15 NOTE — Telephone Encounter (Signed)
 MyChart message sent to patient notifying of recent lab results and recommendations.

## 2024-01-16 ENCOUNTER — Ambulatory Visit: Admitting: Nurse Practitioner

## 2024-01-18 DIAGNOSIS — F411 Generalized anxiety disorder: Secondary | ICD-10-CM | POA: Diagnosis not present

## 2024-01-18 DIAGNOSIS — R5383 Other fatigue: Secondary | ICD-10-CM | POA: Insufficient documentation

## 2024-01-18 DIAGNOSIS — F332 Major depressive disorder, recurrent severe without psychotic features: Secondary | ICD-10-CM | POA: Diagnosis not present

## 2024-01-18 NOTE — Assessment & Plan Note (Signed)
 Defer to plan per weakness

## 2024-01-25 NOTE — Telephone Encounter (Signed)
 ATC X1. LMTCB. I will route to the front desk for scheduling.

## 2024-01-26 ENCOUNTER — Telehealth: Payer: Self-pay

## 2024-01-26 ENCOUNTER — Telehealth: Payer: Self-pay | Admitting: Primary Care

## 2024-01-26 ENCOUNTER — Telehealth: Admitting: Primary Care

## 2024-01-26 DIAGNOSIS — G4733 Obstructive sleep apnea (adult) (pediatric): Secondary | ICD-10-CM

## 2024-01-26 DIAGNOSIS — G4752 REM sleep behavior disorder: Secondary | ICD-10-CM

## 2024-01-26 MED ORDER — CLONAZEPAM 1 MG PO TABS: 0.5000 mg | ORAL_TABLET | Freq: Every day | ORAL | 2 refills | Status: DC

## 2024-01-26 NOTE — Progress Notes (Signed)
 Pt notified via MyChart, unable to reach patient through telephone call.

## 2024-01-26 NOTE — Telephone Encounter (Signed)
 Front staff- Patient needs follow-up CPAP compliance and Insomnia with Beth NP in 6-8 weeks  Ashlyn - please get cpap download from [facility]

## 2024-01-26 NOTE — Telephone Encounter (Signed)
 I was unable to find a dl for pt on [facility] for a compliance report. I called [facility] and spoke to Jada. Jada was unable to find pt in [facility] and was trying to find a RT for me to speak too. Jada stated there were no available RT's in office but she would place a note for someone to tag our office. NFN

## 2024-01-26 NOTE — Progress Notes (Signed)
 Virtual Visit via Video Note  I connected with Stuart Harper on 01/26/24 at  2:00 PM EDT by a video enabled telemedicine application and verified that I am speaking with the correct person using two identifiers.  Location: Patient: Home Provider: Office    I discussed the limitations of evaluation and management by telemedicine and the availability of in person appointments. The patient expressed understanding and agreed to proceed.  History of Present Illness:  28 year old male, former smoker. PMH significant for anxiety disorder, polysubstance abuse, OSA, asthma, allergic rhinitis.   Previous [facility] pulmonary encounter: 02/22/2023 Patient had home sleep study in 2021 that showed evidence of moderate OSA, AHI 22/hour. He has been on CPAP for the last 3 years. SD card did not have data for review today. He is consistently wearing CPAP, average 8 hours a night. He noticed some benefit from the first 6-12 months. He has not been sleeping well for last year or two. Reports restless sleep as well as nightmares. He wakes up with cold a sweat and vivid nightmares 2-3 times a week. He will dream about what he has to do that week but states that it goes terribly wrong and there will be monsters. He has been told that he acts our his dreams and has been noted to talk in his sleep since he was a child. No concern for narcolepsy, cataplexy. No sleep walking.   He has tried melatonin in the past but not currently taking. Started on bupropion yesterday for anxiety/ADHD symptoms, he will see psychiatry back in 2 weeks.  He works evenings.  Typical bedtime is between 12 AM and 4 AM.  Takes him an hour to fall asleep.  He wakes up 1-3 times a night.  He starts his day between noon and 2 PM.  Weight is up 10 pounds.  CPAP pressure setting 5 to 20 cm H2O.  03/06/2023 Patient contacted today for 2 week visit OSA/REM sleep disorder. During our last visit he was started on Melatonin 10mg  for REM sleep  disorder. We received sleep study from 01/15/20 that showed patient has moderate OSA, AHI 22.9/hour with SpO2 low 82%. He contacted [employer] for CPAP download. He has a Furniture conservator/restorer and uses Icode Connect. He is 77% compliance with CPAP use last 30 days. Average daily usage 5 hours 42 mins. Average pressure 95% percentile 8.0cm h20. Average AHI 2.0/hour.   He is unsure if he has noticed any significant change in sleep since starting Melatonin but sleep is no worse. He is still taking Dextromethorphan-Bupropion for anxiety/ADHD symptoms and will be following up with psychiatry tomorrow.  OSA: - Stable; Patient has moderate OSA, AHI 22/hour - Sleep apnea is well controlled on auto settings; residual AHI 2.0/hour  - No changes recommended, advised patient continue to wear CPAP nightly 4-6 hours  REM sleep disorder: - Continue Melatonin 10mg  at bedtime. Advised patient keep sleep diary and maintain routine sleep scheduled. If nightmares/sleep disruptions do not improve in another 2-4 weeks we can switch to Clonazepam    01/26/2024 - Interim hx  Discussed the use of AI scribe software for clinical note transcription with the patient, who gave verbal consent to proceed.  History of Present Illness   Stuart Harper is a 28 year old male with moderate obstructive sleep apnea who presents for follow-up regarding REM sleep disorder.  He has a history of moderate obstructive sleep apnea diagnosed in 2021, for which he has been using a CPAP machine  consistently every night for the past four years. He reports 100% compliance with CPAP use nightly. No nights without CPAP use are reported.  He experiences episodes of acting out dreams, including waking up screaming and fighting in his sleep, occurring a couple of times a week with less severe dreams in between. He tried melatonin 10 mg without improvement. Recently, he started taking clonazepam , initially at 0.5 mg at bedtime, which he has taken for the  last 7-10 days. Since starting clonazepam , he has not experienced violent dreams but continues to talk in his sleep and sleep continues to be restless. No daytime grogginess is associated with clonazepam  use.   No download available in [facility] will be faxing report    Observations/Objective:  Appear well without overt respiratory symptoms  Assessment and Plan:  1. OSA (obstructive sleep apnea) (Primary)  2. REM sleep behavior disorder  Assessment and Plan    REM Sleep Behavior Disorder Characterized by acting out dreams, including shouting and fighting during sleep. Previously tried melatonin 10 mg without improvement. Recently started on clonazepam  0.5 mg nightly with partial response; reduced violent dreams but still experiencing talking and restlessness. Discussed increasing clonazepam  to 1 mg nightly due to partial response and tolerability of current dose. Discussed risks of dependency and impairment in cognitive function reviewed. Typical dose for REM sleep disorder is 0.5-2 mg. - Increase clonazepam  to 1 mg nightly. - Send prescription for clonazepam  1 mg. - Check in 6-8 weeks to assess response to increased clonazepam  dose.  Obstructive Sleep Apnea Diagnosed in 2021 with a sleep study showing moderate severity. Consistent use of CPAP therapy nightly. No current issues reported with CPAP usage. - Contact [facility] to obtain a download from the CPAP machine.  Follow-up Ongoing management of REM sleep behavior disorder and monitoring of obstructive sleep apnea.  Follow Up Instructions:  - Schedule follow-up in 6-8 weeks to evaluate response to clonazepam  1 mg.   I discussed the assessment and treatment plan with the patient. The patient was provided an opportunity to ask questions and all were answered. The patient agreed with the plan and demonstrated an understanding of the instructions.   The patient was advised to call back or seek an in-person evaluation if the  symptoms worsen or if the condition fails to improve as anticipated.  I provided 22 minutes of non-face-to-face time during this encounter.   [name], NP

## 2024-01-26 NOTE — Patient Instructions (Addendum)
-  REM SLEEP BEHAVIOR DISORDER: REM sleep behavior disorder is a condition where you act out your dreams, which can include shouting and physical movements. You have been taking clonazepam  0.5 mg at bedtime with some improvement. We decided to increase your dose to 1 mg nightly to help reduce your symptoms further. A prescription for clonazepam  1 mg will be sent to you. We will check in 6-8 weeks to see how you are responding to the increased dose.  -OBSTRUCTIVE SLEEP APNEA: Obstructive sleep apnea is a condition where your breathing stops and starts during sleep. You have been using your CPAP machine every night without issues. We will contact [facility] to get a download from your CPAP machine to monitor your usage and effectiveness.  INSTRUCTIONS: Please schedule a follow-up appointment in 6-8 weeks to evaluate your response to the increased clonazepam  dose.

## 2024-01-26 NOTE — Telephone Encounter (Signed)
 I spoke to Jada from [employer]. She could not find a DL on [employer] and stated a RT was not available at this time. She will place a note for a RT to tag our office. I will try again Monday if I do not see anything in [employer].

## 2024-01-29 NOTE — Telephone Encounter (Signed)
 I called [employer] and spoke with Ivette Marks. Ivette Marks states the pt has a Bartley Lightning which requires a code. Ivette Marks stated she would put a ticket in for this so someone can call the pt to get the code and then our office can be tagged in [employer]. Will await for our office to be tagged or if [employer] calls back.

## 2024-01-30 NOTE — Telephone Encounter (Signed)
 I checked [employer] again. Our office is still not tagged in order to see pt's compliance. I will have to call the DME during business hours.

## 2024-02-02 NOTE — Telephone Encounter (Signed)
 I called [employer] and spoke to [place]. Thor Fling stated that this faxed over on 01-31-24. I informed Thor Fling that we have not received it. Thor Fling stated she would fax this over again and I gave her B pod's fax number. Thor Fling stated she sent this and to let her know if we still did not receive it, then she would try emailing it. NFN. I just received the fax. Sending to Irby Mannan, NP for review.

## 2024-02-05 NOTE — Telephone Encounter (Signed)
 Icode report 01/31/24 Icode period 90 Best 30 day compliance 30/30 days (100%) Days of therapy 85/90 days (94%) Average daily usage 08:42 Average 95 10.5 Average AHI 9 Average SNI 19.8 High leak time (%) 0

## 2024-02-07 ENCOUNTER — Telehealth: Payer: Self-pay

## 2024-02-07 ENCOUNTER — Telehealth (INDEPENDENT_AMBULATORY_CARE_PROVIDER_SITE_OTHER): Admitting: Primary Care

## 2024-02-07 DIAGNOSIS — G4752 REM sleep behavior disorder: Secondary | ICD-10-CM

## 2024-02-07 DIAGNOSIS — G4733 Obstructive sleep apnea (adult) (pediatric): Secondary | ICD-10-CM | POA: Diagnosis not present

## 2024-02-07 NOTE — Progress Notes (Addendum)
 Virtual Visit via Video Note  I connected with Stuart Harper on 02/07/24 at  2:30 PM EDT by a video enabled telemedicine application and verified that I am speaking with the correct person using two identifiers.  Location: Patient: Home Provider: Office   I discussed the limitations of evaluation and management by telemedicine and the availability of in person appointments. The patient expressed understanding and agreed to proceed.  History of Present Illness: 28 year old male, former smoker. PMH significant for anxiety disorder, polysubstance abuse, OSA, asthma, allergic rhinitis.    Previous [facility] encounter: 02/22/2023 Patient had home sleep study in 2021 that showed evidence of moderate OSA, AHI 22/hour. He has been on CPAP for the last 3 years. SD card did not have data for review today. He is consistently wearing CPAP, average 8 hours a night. He noticed some benefit from the first 6-12 months. He has not been sleeping well for last year or two. Reports restless sleep as well as nightmares. He wakes up with cold a sweat and vivid nightmares 2-3 times a week. He will dream about what he has to do that week but states that it goes terribly wrong and there will be monsters. He has been told that he acts our his dreams and has been noted to talk in his sleep since he was a child. No concern for narcolepsy, cataplexy. No sleep walking.    He has tried melatonin in the past but not currently taking. Started on bupropion yesterday for anxiety/ADHD symptoms, he will see psychiatry back in 2 weeks.   He works evenings.  Typical bedtime is between 12 AM and 4 AM.  Takes him an hour to fall asleep.  He wakes up 1-3 times a night.  He starts his day between noon and 2 PM.  Weight is up 10 pounds.  CPAP pressure setting 5 to 20 cm H2O.   03/06/2023 Patient contacted today for 2 week visit OSA/REM sleep disorder. During our last visit he was started on Melatonin 10mg  for REM sleep  disorder. We received sleep study from 01/15/20 that showed patient has moderate OSA, AHI 22.9/hour with SpO2 low 82%. He contacted [employer] for CPAP download. He has a Furniture conservator/restorer and uses Icode Connect. He is 77% compliance with CPAP use last 30 days. Average daily usage 5 hours 42 mins. Average pressure 95% percentile 8.0cm h20. Average AHI 2.0/hour.    He is unsure if he has noticed any significant change in sleep since starting Melatonin but sleep is no worse. He is still taking Dextromethorphan-Bupropion for anxiety/ADHD symptoms and will be following up with psychiatry tomorrow.   OSA: - Stable; Patient has moderate OSA, AHI 22/hour - Sleep apnea is well controlled on auto settings; residual AHI 2.0/hour  - No changes recommended, advised patient continue to wear CPAP nightly 4-6 hours   REM sleep disorder: - Continue Melatonin 10mg  at bedtime. Advised patient keep sleep diary and maintain routine sleep scheduled. If nightmares/sleep disruptions do not improve in another 2-4 weeks we can switch to Clonazepam     01/26/2024 Discussed the use of AI scribe software for clinical note transcription with the patient, who gave verbal consent to proceed.   History of Present Illness   Stuart Harper is a 28 year old male with moderate obstructive sleep apnea who presents for follow-up regarding REM sleep disorder.   He has a history of moderate obstructive sleep apnea diagnosed in 2021, for which he has been  using a CPAP machine consistently every night for the past four years. He reports 100% compliance with CPAP use nightly. No nights without CPAP use are reported.   He experiences episodes of acting out dreams, including waking up screaming and fighting in his sleep, occurring a couple of times a week with less severe dreams in between. He tried melatonin 10 mg without improvement. Recently, he started taking clonazepam , initially at 0.5 mg at bedtime, which he has taken for the last 7-10  days. Since starting clonazepam , he has not experienced violent dreams but continues to talk in his sleep and sleep continues to be restless. No daytime grogginess is associated with clonazepam  use.    No download available in [facility] will be faxing report      Assessment and Plan:   1. OSA (obstructive sleep apnea) (Primary)   2. REM sleep behavior disorder   Assessment and Plan    REM Sleep Behavior Disorder Characterized by acting out dreams, including shouting and fighting during sleep. Previously tried melatonin 10 mg without improvement. Recently started on clonazepam  0.5 mg nightly with partial response; reduced violent dreams but still experiencing talking and restlessness. Discussed increasing clonazepam  to 1 mg nightly due to partial response and tolerability of current dose. Discussed risks of dependency and impairment in cognitive function reviewed. Typical dose for REM sleep disorder is 0.5-2 mg. - Increase clonazepam  to 1 mg nightly. - Send prescription for clonazepam  1 mg. - Check in 6-8 weeks to assess response to increased clonazepam  dose.   Obstructive Sleep Apnea Diagnosed in 2021 with a sleep study showing moderate severity. Consistent use of CPAP therapy nightly. No current issues reported with CPAP usage. - Contact [employer] to obtain a download from the CPAP machine.  Icode report 01/31/24 Icode period 90 Best 30 day compliance 30/30 days (100%) Days of therapy 85/90 days (94%) Average daily usage 08:42 Average 95 10.5 Average AHI 9 Average SNI 19.8 High leak time (%) 0   02/07/2024  Discussed the use of AI scribe software for clinical note transcription with the patient, who gave verbal consent to proceed.  History of Present Illness   Stuart Harper is a 28 year old male with sleep apnea who presents for follow-up regarding CPAP machine usage and sleep disturbances.  He is experiencing issues with his CPAP machine, which he uses  consistently. CPAP download showed pressure 10.5 (95%) with residual AHI 9. There are no significant changes in snoring or waking up gasping, but his sleep partner notes he still talks in his sleep.  He has been using clonazepam  to address sleep disturbances, including talking in his sleep. He reports a partial response to the medication, with some days being better than others. He is currently unsure if the medication is making a significant difference and feels more time is needed to assess its effectiveness. No mental fog, mood changes, grogginess, or cognitive impairment from the medication.  He has not ordered new parts for his CPAP machine in two to three years and has had to make temporary repairs to the equipment. He is concerned about the cost and billing practices of his current medical supply company and is interested in exploring other options for obtaining CPAP supplies. He is unsure if his Medicaid can be used to reduce costs for new parts.  He reports a slight weight gain of about five pounds over the last six months. He typically sleeps on his back but sometimes rolls onto his side during  sleep. He uses two pillows, with the one closest to his head being thicker and shaped to support his head. He has not tried a wedge pillow but is open to adjusting his sleep position to improve his symptoms.      Observations/Objective:  Appears well without respiratory symptoms   Assessment and Plan:  1. OSA (obstructive sleep apnea) (Primary) - AMB REFERRAL FOR DME - Ambulatory Referral for DME  2. REM sleep behavior disorder  Assessment and Plan    Obstructive Sleep Apnea Persistent obstructive sleep apnea with residual apneic events despite 100% CPAP usage. Current settings are on auto with pressures ranging from 5 to 20 cm H2O. Average pressure at the 95th percentile is 10.5 cm H2O with approximately 9 apneic events per hour. Potential contributing factors include a slight weight gain of  5 pounds and possible equipment issues with CPAP parts. No significant air leaks detected in the CPAP download. Adjusting pressure settings from auto to a set pressure is recommended to reduce apneic events. - Adjust CPAP settings from auto to a set pressure 12cm h20. - Coordinate with a new medical supply store for CPAP supplies. - Replace CPAP mask monthly, tubing every three months, headgear every six months, water chamber every six months, and air filter monthly. - Consider using a wedge pillow to elevate head during sleep. - Ensure CPAP machine is turned on for pressure adjustment. - Contact him if unable to adjust CPAP settings remotely.  REM Sleep Behavior Disorder REM sleep behavior disorder with partial response to clonazepam . Symptoms include sleep talking and acting out dreams. Clonazepam  dose was increased to 1mg  following a virtual visit on May 9th. Typical Clonazepam  dose for REM sleep disorder is between 0.5-2 mg. He reports variable symptom control with some days better than others. Potential exacerbation due to residual apneic events from obstructive sleep apnea. Monitoring for side effects such as mental fog, mood changes, or cognitive impairment is necessary. - Maintain current clonazepam  dose and monitor symptoms. - Consider increasing clonazepam  dose if symptoms persist and he tolerates current dose without side effects. - Keep a sleep log to track frequency and severity of symptoms.  Follow Up Instructions:  8 weeks with Jerlene Moody NP   I discussed the assessment and treatment plan with the patient. The patient was provided an opportunity to ask questions and all were answered. The patient agreed with the plan and demonstrated an understanding of the instructions.   The patient was advised to call back or seek an in-person evaluation if the symptoms worsen or if the condition fails to improve as anticipated.  I provided 22 minutes of non-face-to-face time during this  encounter.   [name], NP

## 2024-02-07 NOTE — Patient Instructions (Addendum)
  Call [facility] see if we can change pressure settings or if patient needs to bring in machine (Luna machine) Change CPAP pressure 12cm h20 Patient would like to change DME companies after pressure change for CPAP supplies renewal  Needs follow-up in 6-8 weeks for OSA/REM sleep disorder

## 2024-02-07 NOTE — Telephone Encounter (Signed)
 Please call and schedule pt for a 6-8 week f/u with Beth for cpap.

## 2024-02-09 ENCOUNTER — Telehealth: Payer: Self-pay | Admitting: Primary Care

## 2024-02-09 NOTE — Telephone Encounter (Signed)
 Can you confirm CPAP pressure settings were able to be changed to 12cm h20. Patient has Furniture conservator/restorer.   Can be done next week

## 2024-02-09 NOTE — Telephone Encounter (Signed)
 I called [facility] and spoke to [facility]. I asked Mitch if he was able to confirm the pt's pressure settings on a Luna machine without needing the number. Irby Mannan, NP wanted to confirm if the pressure settings were 12 cm H20. Mitch stated the account shows 5-20 cm h20 but that is from an old account. Harriet Limber stated this is the only account they can see on the pt. Harriet Limber said this is from the ENT on 01-30-2020. I informed Harriet Limber that an order for the settings was placed on 02-07-24. Harriet Limber stated that since [place] did receive it, Rodman Clam may have sent the order off to the PAP intake processing team which can take up to 7-14 days as long as it does not take an excessive processing time due to insurance. Harriet Limber stated that because this was sent 2 days ago, it may take more time to see if the setting adjustment was processed.

## 2024-02-13 ENCOUNTER — Telehealth: Payer: Self-pay | Admitting: Primary Care

## 2024-02-13 MED ORDER — CLONAZEPAM 1 MG PO TABS: 1.0000 mg | ORAL_TABLET | Freq: Every day | ORAL | 2 refills | Status: DC

## 2024-02-13 NOTE — Telephone Encounter (Signed)
 RX sent

## 2024-02-13 NOTE — Telephone Encounter (Signed)
 Thanks

## 2024-02-14 ENCOUNTER — Encounter: Payer: Medicaid Other | Admitting: Nurse Practitioner

## 2024-02-20 ENCOUNTER — Telehealth (HOSPITAL_BASED_OUTPATIENT_CLINIC_OR_DEPARTMENT_OTHER): Payer: Self-pay

## 2024-02-20 ENCOUNTER — Ambulatory Visit (INDEPENDENT_AMBULATORY_CARE_PROVIDER_SITE_OTHER): Payer: MEDICAID | Admitting: Urology

## 2024-02-20 VITALS — BP 129/88 | HR 102 | Ht 72.0 in | Wt 198.0 lb

## 2024-02-20 DIAGNOSIS — N529 Male erectile dysfunction, unspecified: Secondary | ICD-10-CM

## 2024-02-20 DIAGNOSIS — E291 Testicular hypofunction: Secondary | ICD-10-CM

## 2024-02-20 MED ORDER — TADALAFIL 5 MG PO TABS: 5.0000 mg | ORAL_TABLET | Freq: Every day | ORAL | 11 refills | Status: DC | PRN

## 2024-02-20 NOTE — Progress Notes (Signed)
 02/20/24 3:36 PM   Stuart Harper [employer] [place] 1996-08-27 [id number]  CC: Low testosterone , ED  HPI: 28 year old male with bipolar 1 disorder, OCD, on multiple medications including clonazepam , Flexeril, dextro morphine-bupropion, Fanapt, melatonin, prazosin, seroquel  referred for low testosterone .  He reports about 1 year of fatigue and weakness.  Most recent testosterone  from April 2025 was 47, decreased from 457 in 2019.  He does not have any children, unsure if he wants children in the future.  He also has some problems with erections, interested in medications at this point.   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/16/2017   Wart of face 09/05/2022    Surgical History: Past Surgical History:  Procedure Laterality Date   COLONOSCOPY WITH PROPOFOL  N/A 05/26/2017   Procedure: COLONOSCOPY WITH PROPOFOL ;  Surgeon: Luke Salaam, MD;  Location: [facility] ENDOSCOPY;  Service: Gastroenterology;  Laterality: N/A;   ESOPHAGOGASTRODUODENOSCOPY (EGD) WITH PROPOFOL  N/A 05/26/2017   Procedure: ESOPHAGOGASTRODUODENOSCOPY (EGD) WITH PROPOFOL ;  Surgeon: Luke Salaam, MD;  Location: [facility] ENDOSCOPY;  Service: Gastroenterology;  Laterality: N/A;   None       Family History: Family History  Problem Relation Age of Onset   Allergies Mother    Asthma Mother    Migraines Mother    Hypertension Father    Allergies Father    Osteoarthritis Father    Depression Father    Lung cancer Maternal Grandfather    Bipolar disorder  Paternal Grandmother    Alzheimer's disease Paternal Grandfather    Rheum arthritis Paternal Uncle    Asthma Paternal Uncle    CAD Other     Social History:  reports that he has quit smoking. He has never used smokeless tobacco. He reports current alcohol use. He reports that he does not currently use drugs after having used the following drugs: Marijuana, Cocaine, Benzodiazepines, Oxycodone, Psilocybin, LSD, and Methylphenidate.  Physical Exam: BP 129/88   Pulse (!) 102   Ht 6' (1.829 m)   Wt 198 lb (89.8 kg)   BMI 26.85 kg/m    Constitutional:  Alert and oriented, No acute distress. Cardiovascular: No clubbing, cyanosis, or edema. Respiratory: Normal respiratory effort, no increased work of breathing. GI: Abdomen is soft, nontender, nondistended, no abdominal masses GU: Testicles 20 cc and descended bilaterally without masses   Assessment & Plan:   28 year old male with psychiatric history and multiple medications, recent low testosterone  of 47, decreased from 457 in 2019.  He has symptoms of tiredness, fatigue, weakness, and ED.  We reviewed the AUA guidelines regarding evaluation and management, and I recommended repeat lab work.  If testosterone  remains low with no other abnormalities, he was interested in a trial of Clomid to preserve fertility.  Risk and benefits were discussed at length.  He was also interested in a trial of Cialis for ED.  Trial of Cialis for ED Testosterone , LH, estradiol, prolactin, call with results.  If testosterone  low and other labs normal will start Clomid 25 mg daily   Jay Meth, MD 02/20/2024  [facility] [address]  [address] [place], [place] [address] [phone]

## 2024-02-20 NOTE — Telephone Encounter (Signed)
 Copied from CRM [id number]. Topic: Clinical - Prescription Issue >> Feb 16, 2024  2:52 PM Stuart Harper wrote: Reason for CRM: Pt has called a couple times in regards to his medication being incorrectly prescribed and sent over to his preferred pharmacy. Pt needs clonazePAM  (KLONOPIN ) 1 MG tablet, however, it is being written as 30mg  tablets for a 2 month supply. Pt stated his pharmacy will not fill it without a corrected script. Please bring this to his provider's attention NP [name]. Pt stated he is almost out of his medication. Pt's phone number is [phone].

## 2024-02-20 NOTE — Patient Instructions (Signed)
 Hypogonadism, Male  Male hypogonadism is a condition of having a level of testosterone  that is lower than normal. Testosterone  is a chemical, or hormone, that is made mainly in the testicles. In boys, testosterone  is responsible for the development of male characteristics during puberty. These include: Making the penis bigger. Growing and building the muscles. Growing facial hair. Deepening the voice. In adult men, testosterone  is responsible for maintaining: An interest in sex and the ability to have sex. Muscle mass. Sperm production. Red blood cell production. Bone strength. Testosterone  also gives men energy and a sense of well-being. Testosterone  normally decreases as men age and the testicles make less testosterone . Testosterone  levels can vary from man to man. Not all men will have signs and symptoms of low testosterone . Weight, alcohol use, medicines, and certain medical conditions can affect a man's testosterone  level. What are the causes? This condition is caused by: A natural decrease in testosterone  that occurs as a man grows older. This is the main cause of this condition. Use of medicines, such as antidepressants, steroids, and opioids. Diseases and conditions that affect the testicles or the making of testosterone . These include: Injury or damage to the testicles from trauma, cancer, cancer treatment, or infection. Diabetes. Sleep apnea. Genetic conditions that men are born with. Disease of the pituitary gland. This gland is in the brain. It produces hormones. Obesity. Metabolic syndrome. This is a group of diseases that affect blood pressure, blood sugar, cholesterol, and belly fat. HIV or AIDS. Alcohol abuse. Kidney failure. Other long-term or chronic diseases. What are the signs or symptoms? Common symptoms of this condition include: Loss of interest in sex (low sex drive). Inability to have or maintain an erection (erectile dysfunction). Feeling tired  (fatigue). Mood changes, like irritability or depression. Loss of muscle and body hair. Infertility. Large breasts. Weight gain (obesity). How is this diagnosed? Your health care provider can diagnose hypogonadism based on: Your signs and symptoms. A physical exam to check your testosterone  levels. This includes blood tests. Testosterone  levels can change throughout the day. Levels are highest in the morning. You may need to have repeat blood tests before getting a diagnosis of hypogonadism. Depending on your medical history and test results, your health care provider may also do other tests to find the cause of low testosterone . How is this treated? This condition is treated with testosterone  replacement therapy. Testosterone  can be given by: Injection or through pellets inserted under the skin. Gels or patches placed on the skin or in the mouth. Testosterone  therapy is not for everyone. It has risks and side effects. Your health care provider will consider your medical history, your risk for prostate cancer, your age, and your symptoms before putting you on testosterone  replacement therapy. Follow these instructions at home: Take over-the-counter and prescription medicines only as told by your health care provider. Eat foods that are high in fiber, such as beans, whole grains, and fresh fruits and vegetables. Limit foods that are high in fat and processed sugars, such as fried or sweet foods. If you drink alcohol: Limit how much you have to 0-2 drinks a day. Know how much alcohol is in your drink. In the [place] one drink equals one 12 oz bottle of beer (355 mL), one 5 oz glass of wine (148 mL), or one 1 oz glass of hard liquor (44 mL). Return to your normal activities as told by your health care provider. Ask your health care provider what activities are safe for you. Keep all  follow-up visits. This is important. Contact a health care provider if: You have any of the signs or symptoms of  low testosterone . You have any side effects from testosterone  therapy. Summary Male hypogonadism is a condition of having a level of testosterone  that is lower than normal. The natural drop in testosterone  production that occurs with age is the most common cause of this condition. Low testosterone  can also be caused by many diseases and conditions that affect the testicles and the making of testosterone . This condition is treated with testosterone  replacement therapy. There are risks and side effects of testosterone  therapy. Your health care provider will consider your age, medical history, symptoms, and risks for prostate cancer before putting you on testosterone  therapy. This information is not intended to replace advice given to you by your health care provider. Make sure you discuss any questions you have with your health care provider. Document Revised: 08/14/2023 Document Reviewed: 08/14/2023 Elsevier Patient Education  2025 [employer].

## 2024-02-20 NOTE — Telephone Encounter (Signed)
 Thr RX was written correctly, he should be taking 1mg  at bedtime # 30 tablets

## 2024-02-21 ENCOUNTER — Other Ambulatory Visit (INDEPENDENT_AMBULATORY_CARE_PROVIDER_SITE_OTHER): Payer: MEDICAID

## 2024-02-21 ENCOUNTER — Telehealth (HOSPITAL_BASED_OUTPATIENT_CLINIC_OR_DEPARTMENT_OTHER): Payer: Self-pay

## 2024-02-21 DIAGNOSIS — E291 Testicular hypofunction: Secondary | ICD-10-CM | POA: Diagnosis not present

## 2024-02-21 DIAGNOSIS — Z79899 Other long term (current) drug therapy: Secondary | ICD-10-CM | POA: Diagnosis not present

## 2024-02-21 DIAGNOSIS — F411 Generalized anxiety disorder: Secondary | ICD-10-CM | POA: Diagnosis not present

## 2024-02-21 DIAGNOSIS — F6089 Other specific personality disorders: Secondary | ICD-10-CM | POA: Diagnosis not present

## 2024-02-21 DIAGNOSIS — F32A Depression, unspecified: Secondary | ICD-10-CM | POA: Diagnosis not present

## 2024-02-21 DIAGNOSIS — F332 Major depressive disorder, recurrent severe without psychotic features: Secondary | ICD-10-CM | POA: Diagnosis not present

## 2024-02-21 NOTE — Telephone Encounter (Signed)
 Pharmacy notified.

## 2024-02-21 NOTE — Telephone Encounter (Signed)
 Copied from CRM [id number]. Topic: Appointments - Scheduling Inquiry for Clinic >> Feb 20, 2024  2:22 PM Hilton Lucky wrote: Reason for CRM: Patient seeking to have a CPAP compliance appointment with NP Sueanne Emerald. Patient is returning call that mentioned getting him scheduled. Please reach out to patient if intent was to schedule for this, as E2C2 showing no availability at this location for this provider.   Updated patient on note regarding Klonopin .

## 2024-02-22 ENCOUNTER — Ambulatory Visit: Payer: Self-pay | Admitting: Urology

## 2024-02-22 ENCOUNTER — Telehealth: Payer: Self-pay | Admitting: Primary Care

## 2024-02-22 DIAGNOSIS — E291 Testicular hypofunction: Secondary | ICD-10-CM

## 2024-02-22 LAB — ESTRADIOL: Estradiol: 19.2 pg/mL (ref 7.6–42.6)

## 2024-02-22 LAB — TESTOSTERONE: Testosterone: 114 ng/dL — ABNORMAL LOW (ref 264–916)

## 2024-02-22 LAB — LUTEINIZING HORMONE: LH: 6.1 m[IU]/mL (ref 1.7–8.6)

## 2024-02-22 LAB — PROLACTIN: Prolactin: 20.6 ng/mL (ref 3.6–31.5)

## 2024-02-22 MED ORDER — CLOMIPHENE CITRATE 50 MG PO TABS: ORAL_TABLET | ORAL | 6 refills | Status: DC

## 2024-02-22 NOTE — Telephone Encounter (Signed)
 Stuart Mannan, NP can do a compliance/ CPAP follow up virtually. Those do not have to be in person. He does however have to be seen annually in order for us  to send him supplies.   Per the LOV, pt was advised to have an in person visit with [facility] if symptoms worsen and or conditions fail to improve. If the pt is not having issues, then it should be okay for a virtual visit.

## 2024-02-22 NOTE — Telephone Encounter (Signed)
 Continuation of last encounter:   Attempted to call patient to get scheduled for a MyChart video visit for his cpap compliance.

## 2024-02-22 NOTE — Telephone Encounter (Signed)
 Compliance is not sent to the DME. We get a compliance report from the DME.

## 2024-02-22 NOTE — Telephone Encounter (Signed)
 RX sent in, Testosterone  ordered.

## 2024-02-26 ENCOUNTER — Telehealth: Payer: Self-pay | Admitting: Primary Care

## 2024-02-26 DIAGNOSIS — F411 Generalized anxiety disorder: Secondary | ICD-10-CM | POA: Diagnosis not present

## 2024-02-26 DIAGNOSIS — F332 Major depressive disorder, recurrent severe without psychotic features: Secondary | ICD-10-CM | POA: Diagnosis not present

## 2024-02-26 NOTE — Telephone Encounter (Signed)
 PT could not make appt in time for 90 day CPAP Insurance deadline. Please call pt to advise what we do in situations like this.  Also, he states when we called in the corrected RX (See 6/3 tel encounter) [facility] states there is something wrong with "DEA"  or her lic. for Ms. Sueanne Emerald. They need her to call them or have a Dr.call Please have Ms. Sueanne Emerald Call [facility] to figure out what is going on. They are on [facility]

## 2024-02-28 ENCOUNTER — Telehealth: Payer: Self-pay | Admitting: Primary Care

## 2024-02-28 DIAGNOSIS — Z79899 Other long term (current) drug therapy: Secondary | ICD-10-CM | POA: Diagnosis not present

## 2024-02-28 DIAGNOSIS — F6089 Other specific personality disorders: Secondary | ICD-10-CM | POA: Diagnosis not present

## 2024-02-28 DIAGNOSIS — F411 Generalized anxiety disorder: Secondary | ICD-10-CM | POA: Diagnosis not present

## 2024-02-28 DIAGNOSIS — F32A Depression, unspecified: Secondary | ICD-10-CM | POA: Diagnosis not present

## 2024-02-28 DIAGNOSIS — F332 Major depressive disorder, recurrent severe without psychotic features: Secondary | ICD-10-CM | POA: Diagnosis not present

## 2024-02-28 NOTE — Telephone Encounter (Signed)
 What do you mean 90 day deadline? Is he not able to come in for an appointment?  Beth, I'm assuming they are having an issue with your DEA number?

## 2024-02-28 NOTE — Telephone Encounter (Signed)
 CMN for [place] [facility] Approval

## 2024-02-28 NOTE — Telephone Encounter (Signed)
 Stuart Harper was supposed to fax me a compliance report. If he is wearing it I will addend my note from may to reflect this. My DEA was renewed last week and other prescriptions have gone through, can you call the pharmacy. The number should still be the same

## 2024-02-29 NOTE — Telephone Encounter (Signed)
 CMN signed, fax conformation attached, and sent to scan.

## 2024-02-29 NOTE — Telephone Encounter (Signed)
 Updated PT by leaving a VM we are working on his concerns on meds and that Ms. Sueanne Emerald will be checking his compliance report.

## 2024-03-01 MED ORDER — CLONAZEPAM 1 MG PO TABS: 1.0000 mg | ORAL_TABLET | Freq: Every day | ORAL | 2 refills | Status: DC

## 2024-03-01 NOTE — Telephone Encounter (Signed)
 I called [facility]. It was stated by a pharmacist that on 02-21-24, the refill was inactivated. It was stated the RX they received on 01-26-24 was for 0.5 tablet at HS instead of 1 tab at HS. On 02-21-24, it was stated the refill was cancelled out and the pharmacy needs a new RX stating the directions are Take 1 tablet by mouth at bedtime.

## 2024-03-01 NOTE — Telephone Encounter (Signed)
 I have resent prescription for clonazepam  1mg  at bedtime # 30 tablets

## 2024-03-06 DIAGNOSIS — F411 Generalized anxiety disorder: Secondary | ICD-10-CM | POA: Diagnosis not present

## 2024-03-06 DIAGNOSIS — F6089 Other specific personality disorders: Secondary | ICD-10-CM | POA: Diagnosis not present

## 2024-03-06 DIAGNOSIS — F32A Depression, unspecified: Secondary | ICD-10-CM | POA: Diagnosis not present

## 2024-03-06 DIAGNOSIS — F332 Major depressive disorder, recurrent severe without psychotic features: Secondary | ICD-10-CM | POA: Diagnosis not present

## 2024-03-06 DIAGNOSIS — Z79899 Other long term (current) drug therapy: Secondary | ICD-10-CM | POA: Diagnosis not present

## 2024-03-08 ENCOUNTER — Telehealth: Payer: Self-pay

## 2024-03-08 MED ORDER — CLOMIPHENE CITRATE 50 MG PO TABS
ORAL_TABLET | ORAL | 6 refills | Status: AC
Start: 2024-03-08 — End: ?

## 2024-03-08 NOTE — Addendum Note (Signed)
 Addended by: Karyl Paget on: 03/08/2024 04:13 PM   Modules accepted: Orders

## 2024-03-08 NOTE — Telephone Encounter (Signed)
 Copied from CRM [id number]. Topic: Clinical - Order For Equipment >> Mar 08, 2024 11:47 AM Stuart Harper wrote: Reason for CRM:   Pt is contacting clinic regarding his CPAP supplies he is attempting to receive through [facility]. He was advised he would need to be seen by provider and compliant with machine so that his insurance will cover the machine. He was given a 30-day period to see provider; however, there were no avail appts. Was advised provider would check compliance report and addend her note reflecting his compliance.   Reviewed chart and advised a PA for supplies was sent to [facility] on 06/17. He would like a call back to verify if there is anything else needed from him. He also wanted to verify that [name] has his new insurance, [facility], since he recently obtained the insurance.  CB# [phone]  Order was placed for CPAP supplies in May 2025. I will route to the [facility] to see if there is anything that needs to be done.

## 2024-03-08 NOTE — Telephone Encounter (Signed)
 RX sent to [facility].

## 2024-03-08 NOTE — Telephone Encounter (Signed)
 I called and spoke to pt. Pt informed of Beth's note. Pt verbalized understanding. NFN

## 2024-03-11 NOTE — Telephone Encounter (Signed)
 [place], [place]  [place], [place]; [place], [place]; [place], [place] We are waiting on his prior authorization to be returned. Once this is returned we will schedule him.

## 2024-03-11 NOTE — Telephone Encounter (Signed)
 Pcc,   Please let us  know if you find out anything so we can inform the pt.

## 2024-03-16 ENCOUNTER — Other Ambulatory Visit: Payer: Self-pay | Admitting: Nurse Practitioner

## 2024-03-16 DIAGNOSIS — T7840XD Allergy, unspecified, subsequent encounter: Secondary | ICD-10-CM

## 2024-03-20 DIAGNOSIS — G4733 Obstructive sleep apnea (adult) (pediatric): Secondary | ICD-10-CM | POA: Diagnosis not present

## 2024-03-25 ENCOUNTER — Encounter: Payer: Self-pay | Admitting: Nurse Practitioner

## 2024-04-01 ENCOUNTER — Other Ambulatory Visit: Payer: Self-pay

## 2024-04-01 ENCOUNTER — Ambulatory Visit: Payer: Self-pay

## 2024-04-01 ENCOUNTER — Emergency Department: Payer: MEDICAID

## 2024-04-01 ENCOUNTER — Emergency Department
Admission: EM | Admit: 2024-04-01 | Discharge: 2024-04-01 | Disposition: A | Discharge status: Home / Self Care | Payer: MEDICAID | Attending: Emergency Medicine | Admitting: Emergency Medicine

## 2024-04-01 DIAGNOSIS — S7011XA Contusion of right thigh, initial encounter: Secondary | ICD-10-CM | POA: Diagnosis not present

## 2024-04-01 DIAGNOSIS — M79604 Pain in right leg: Secondary | ICD-10-CM | POA: Diagnosis not present

## 2024-04-01 DIAGNOSIS — L729 Follicular cyst of the skin and subcutaneous tissue, unspecified: Secondary | ICD-10-CM | POA: Insufficient documentation

## 2024-04-01 DIAGNOSIS — M7121 Synovial cyst of popliteal space [Baker], right knee: Secondary | ICD-10-CM | POA: Diagnosis not present

## 2024-04-01 NOTE — Discharge Instructions (Addendum)
 You have been diagnosed with subcutaneous cyst in your right thigh.  Please call dermatology to set up an appointment for a draining of the cyst.  Please come back to ED or go to your PCP if you have new symptoms or symptoms worsen.

## 2024-04-01 NOTE — ED Triage Notes (Signed)
 Patient ambulatory to triage with complaints of right leg shooting pains and recurrent bruising to right thigh over the past year. Patient states pain and bruising have gotten worse over the past week and that's why he decided to come in. Denies loss of bowel/bladder control, numbness/tingling in foot.

## 2024-04-01 NOTE — Telephone Encounter (Signed)
 FYI Only or Action Required?: FYI only for provider.  Patient was last seen in primary care on 01/10/2024 by Abbey Bruckner, MD.  Called Nurse Triage reporting Numbness, Bleeding/Bruising, Leg Pain, and Anxiety.  Symptoms began several months ago.  Interventions attempted: Nothing.  Symptoms are: gradually worsening.  Triage Disposition: See HCP Within 4 Hours (Or PCP Triage)  Patient/caregiver understands and will follow disposition?: Unsure      Copied from CRM [id number]. Topic: Clinical - Red Word Triage >> Apr 01, 2024  3:39 PM [place] wrote: Red Word that prompted transfer to Nurse Triage: Issue with right leg- was seen last year for it. Numbness- bruising that has lasted over a year. thinks it might be a blood clot  Reason for Disposition  [1] Thigh or calf pain AND [2] only 1 side AND [3] present > 1 hour  (Exception: Chronic unchanged pain.)  Answer Assessment - Initial Assessment Questions 1. ONSET: When did the pain start?      Over a year ago 2. LOCATION: Where is the pain located?      Right leg 3. PAIN: How bad is the pain?    (Scale 1-10; or mild, moderate, severe)     Worse when sitting or laying down, don't notice as much when active, constant fluctuates throughout the day, moments of shooting sharp pain on outside part of leg, whole right quad from thigh to knee on right side right leg, whole area partially numb, to touch feels numb also pain with it Varies throughout the day sits at 4-5/10, random shooting pains mostly at night when laying down pretty painful 8-9/10, readjust leg and it'll stop 4. WORK OR EXERCISE: Has there been any recent work or exercise that involved this part of the body?      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 OTHER SYMPTOMS: Do you have any other symptoms? (e.g., chest pain, back pain, breathing difficulty, swelling, rash, fever, numbness, weakness)     Bruising 2-inch diameter, bruise small and center between knee and thigh on front, changed over time, just noticed recently that hasn't gone away Pain came and went over past year, was period where wasn't bothering me, lately getting worse for some reason, all these symptoms, numbness and shooting pain No redness or swelling No SOB, chest pain, back pain Thinking about it gets me kind of nervous and breathing gets a bit panicked think just anxiety  Pt reporting his schedule's tight for next few days, debating if should go to UC or wait for PCP, asking nurse how much risk he would be taking if he waited for appt with PCP next week, advised pt be examined sooner rather than later for symptoms despite symptoms being present for while.  Advised pt be examined in next 4 hours, no PCP availability, advised pt go to UC or ED. Pt will try to get to [facility] today. Sending message to office for follow up.  Protocols used: Leg Pain-A-AH

## 2024-04-01 NOTE — ED Provider Notes (Signed)
 [facility] Provider Note    Event Date/Time   First MD Initiated Contact with Patient 04/01/24 2056     (approximate)   History   Leg Pain    HPI  Stuart Harper is a 28 y.o. male    with a past medical history of leg pain, OSA, major depressive disorder low testosterone ,  who presents to the ED complaining of right leg shooting pain. According to the patient, symptoms started one year ago, getting worse last week. Patient states ecchymosis in the inferior medial  third of the right thigh within the last year.  Change in sensation in the lateral area of the right thigh, described as a numbness, tingling and shooting pain.  Patient denies lower back pain, history of trauma.      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/15/2022   Vaping nicotine dependence, tobacco product 12/15/2022   Hallucinogenic mushrooms use disorder, severe, 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/15/2022   Lysergic acid diethylamide (LSD) use disorder, moderate, 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/15/2022   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 12/15/2022   Generalized anxiety disorder with panic attacks 12/15/2022   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 09/05/2022   OSA (obstructive sleep apnea) 01/10/2020   Panic disorder 10/16/2017   Cannabis  abuse, daily use 06/03/2016   Asthma 05/26/2016   Atopic eczema 05/26/2016   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   Allergic rhinitis, seasonal 07/14/2014     ROS: Patient currently denies any vision changes, tinnitus, difficulty speaking, facial droop, sore throat, chest pain, shortness of breath, abdominal pain, nausea/vomiting/diarrhea, dysuria, or weakness/numbness/paresthesias in any extremity   Physical Exam   Triage Vital Signs: ED Triage Vitals  Encounter Vitals Group     BP 04/01/24 2011 (!) 148/105     Girls Systolic BP Percentile --      Girls Diastolic BP Percentile --      Boys Systolic BP Percentile --      Boys Diastolic BP Percentile --      Pulse Rate 04/01/24 2011 (!) 114     Resp 04/01/24 2011 18     Temp 04/01/24 2011 98.4 F (36.9 C)     Temp src --      SpO2 04/01/24 2011 98 %     Weight 04/01/24 2012 200 lb (90.7 kg)     Height 04/01/24 2012 6' (1.829 m)     Head Circumference --      Peak Flow --      Pain Score 04/01/24 2012 6     Pain Loc --      Pain Education --      Exclude from Growth Chart --     Most recent vital signs: Vitals:   04/01/24 2011  BP: (!) 148/105  Pulse: (!) 114  Resp: 18  Temp: 98.4 F (36.9 C)  SpO2:  98%     Physical Exam Vitals and nursing note reviewed.  During triage patient was tachycardic, hypertensive  Constitutional:      General: Awake and alert. No acute distress.    Appearance: Normal appearance. The patient is normal weight.      Able to speak in complete sentences without cough or dyspnea  HENT:     Head: Normocephalic and atraumatic.     Mouth: Mucous membranes are moist.  Eyes:     General: PERRL. Normal EOMs          Conjunctiva/sclera: Conjunctivae normal.  Nose No congestion/rhinorrhea  CV:                  Good peripheral perfusion.  Regular rate and rhythm  Resp:               Normal effort.  Equal breath sounds bilaterally.  Abd:                 No distention.   Soft, nontender.  No rebound or guarding.  Musculoskeletal:        General: No swelling. Normal range of motion.  Right leg: Right thigh: Presence of ecchymosis and resolution at the level of the anterior medial right thigh.  Induration to palpation.  Sensation decreased at the level of the lateral side of the thigh.  Lumbar spine: Skin is intact, no ecchymosis no hematomas no tenderness to palpation of the spinous process of paraspinal muscles.  Skin:    General: Skin is warm and dry.     Capillary Refill: Capillary refill takes less than 2 seconds.     Findings: No rash.  Neurological:     Mental Status: The patient is awake and alert. MAE spontaneously. No gross focal neurologic deficits are appreciated.  Psychiatric Mood and affect are normal. Speech and behavior are normal.  ED Results / Procedures / Treatments   Labs (all labs ordered are listed, but only abnormal results are displayed) Labs Reviewed - No data to display   EKG     RADIOLOGY I independently reviewed and interpreted imaging and agree with radiologists findings.      PROCEDURES:  Critical Care performed:   Procedures   MEDICATIONS ORDERED IN ED: Medications - No data to display Clinical Course as of 04/01/24 2227  Mon Apr 01, 2024  2213 US  RT LOWER EXTREM LTD SOFT TISSUE NON VASCULAR 3 mm cystic area in the subcutaneous tissues of the mid right anterior thigh in the region of patient bruising. No focal mass or drainable fluid collection.   [AE]    Clinical Course User Index [AE] Stuart Kast, PA-C    IMPRESSION / MDM / ASSESSMENT AND PLAN / ED COURSE  I reviewed the triage vital signs and the nursing notes.  Differential diagnosis includes, but is not limited to, abscess, cyst, foreign body, neuropathy  Patient's presentation is most consistent with acute complicated illness / injury requiring diagnostic workup.   Stuart Harper Stuart Harper is a 28 y.o., male Patient's diagnosis  is consistent with subcutaneous cyst in the right thigh.  I independently reviewed and interpreted imaging and agree with radiologists findings.  Did not order any labs, physical exam is reassuring I did review the patient's allergies and medications.The patient is in stable and satisfactory condition for discharge home  Patient will be discharged home without prescriptions. Patient is to follow up with rheumatology as needed or otherwise directed. Patient is given ED precautions to return  to the ED for any worsening or new symptoms. Discussed plan of care with patient, answered all of patient's questions, Patient agreeable to plan of care. Advised patient to take medications according to the instructions on the label. Discussed possible side effects of new medications. Patient verbalized understanding.   FINAL CLINICAL IMPRESSION(S) / ED DIAGNOSES   Final diagnoses:  Subcutaneous cyst     Rx / DC Orders   ED Discharge Orders     None        Note:  This document was prepared using Dragon voice recognition software and may include unintentional dictation errors.   Stuart Kast, PA-C 04/01/24 2227    Malvina Alm DASEN, MD 04/01/24 [phone]

## 2024-04-03 ENCOUNTER — Encounter: Payer: Self-pay | Admitting: Dermatology

## 2024-04-03 ENCOUNTER — Ambulatory Visit: Payer: MEDICAID | Admitting: Dermatology

## 2024-04-03 DIAGNOSIS — R200 Anesthesia of skin: Secondary | ICD-10-CM

## 2024-04-03 DIAGNOSIS — L729 Follicular cyst of the skin and subcutaneous tissue, unspecified: Secondary | ICD-10-CM | POA: Diagnosis not present

## 2024-04-03 DIAGNOSIS — L720 Epidermal cyst: Secondary | ICD-10-CM

## 2024-04-03 NOTE — Patient Instructions (Signed)

## 2024-04-03 NOTE — Progress Notes (Unsigned)
   Follow-Up Visit   Subjective  Stuart Harper is a 28 y.o. male who presents for the following: Patient c/o a cyst on the right upper leg for ~ 1 year now, painful cyst getting worse, pain will sometimes shoot up his leg in this area, patient report right side of his leg is numb.    The following portions of the chart were reviewed this encounter and updated as appropriate: medications, allergies, medical history  Review of Systems:  No other skin or systemic complaints except as noted in HPI or Assessment and Plan.  Objective  Well appearing patient in no apparent distress; mood and affect are within normal limits.  A focused examination was performed of the following areas: Right thigh   Relevant exam findings are noted in the Assessment and Plan.         Assessment & Plan   CYST on right thigh ultrasound Exam: ecchymosis on right anterior thigh. no cystic nodule was palpated or visible during exam.   Plan: Discussed observation vs repeat ultrasound. Patient opts for repeat ultrasound US  RT LOWER EXTREM LTD SOFT TISSUE NON VASCULAR   NUMBNESS Patient reports broad patch of numbness on R lateral thigh   Plan: Discussed that a cyst in the subcutis is unlikely to lead to numbness because nerves are much deeper Referral sent to Neurology.    NUMBNESS IN RIGHT LEG   Related Procedures Ambulatory referral to Neurology FOLLICULAR CYST OF SKIN AND SUBCUTANEOUS TISSUE   Related Procedures US  RT LOWER EXTREM LTD SOFT TISSUE NON VASCULAR  Return if symptoms worsen or fail to improve.  IFay Kirks, CMA, am acting as scribe for Boneta Sharps, MD .   Documentation: I have reviewed the above documentation for accuracy and completeness, and I agree with the above.  Boneta Sharps, MD

## 2024-04-11 ENCOUNTER — Telehealth: Payer: Self-pay

## 2024-04-11 NOTE — Telephone Encounter (Signed)
 Pt is scheduled to see Landry Ferrari, NP virtually for a f/u. I called and spoke to Jada with [employer]. Kari states that she will send a message to a RT since the pt has a Luna and they will try and get back to us  for a copy of a compliance.

## 2024-04-11 NOTE — Telephone Encounter (Signed)
 Stuart Harper with Stuart Harper called me back. Stuart Harper will fax a copy of his compliance to B pod.

## 2024-04-12 ENCOUNTER — Telehealth: Payer: MEDICAID | Admitting: Primary Care

## 2024-04-12 DIAGNOSIS — G4733 Obstructive sleep apnea (adult) (pediatric): Secondary | ICD-10-CM | POA: Diagnosis not present

## 2024-04-12 DIAGNOSIS — G4752 REM sleep behavior disorder: Secondary | ICD-10-CM | POA: Diagnosis not present

## 2024-04-12 MED ORDER — CLONAZEPAM 2 MG PO TABS: 2.0000 mg | ORAL_TABLET | Freq: Every day | ORAL | 0 refills | Status: DC

## 2024-04-12 NOTE — Telephone Encounter (Signed)
 Patient changed DME from [facility] to [facility], can we get download - he has luna machine

## 2024-04-12 NOTE — Telephone Encounter (Signed)
 Patient change DME form [facility] to [facility], he received a new Luna machine this month. He needs an additional follow-up in 4-6 weeks for compliance check

## 2024-04-12 NOTE — Progress Notes (Signed)
 Virtual Visit via Video Note  I connected with Stuart Harper on 04/12/24 at  2:30 PM EDT by a video enabled telemedicine application and verified that I am speaking with the correct person using two identifiers.  Location: Patient: Home Provider: Office    I discussed the limitations of evaluation and management by telemedicine and the availability of in person appointments. The patient expressed understanding and agreed to proceed.  History of Present Illness: 28 year old male, former smoker. PMH significant for anxiety disorder, polysubstance abuse, OSA, asthma, allergic rhinitis.    Previous [facility] pulmonary encounter: 02/07/2024 Discussed the use of AI scribe software for clinical note transcription with the patient, who gave verbal consent to proceed.  History of Present Illness   Stuart Harper is a 28 year old male with sleep apnea who presents for follow-up regarding CPAP machine usage and sleep disturbances.  He is experiencing issues with his CPAP machine, which he uses consistently. CPAP download showed pressure 10.5 (95%) with residual AHI 9. There are no significant changes in snoring or waking up gasping, but his sleep partner notes he still talks in his sleep.  He has been using clonazepam  to address sleep disturbances, including talking in his sleep. He reports a partial response to the medication, with some days being better than others. He is currently unsure if the medication is making a significant difference and feels more time is needed to assess its effectiveness. No mental fog, mood changes, grogginess, or cognitive impairment from the medication.  He has not ordered new parts for his CPAP machine in two to three years and has had to make temporary repairs to the equipment. He is concerned about the cost and billing practices of his current medical supply company and is interested in exploring other options for obtaining CPAP supplies. He is unsure  if his Medicaid can be used to reduce costs for new parts.  He reports a slight weight gain of about five pounds over the last six months. He typically sleeps on his back but sometimes rolls onto his side during sleep. He uses two pillows, with the one closest to his head being thicker and shaped to support his head. He has not tried a wedge pillow but is open to adjusting his sleep position to improve his symptoms.     1. OSA (obstructive sleep apnea) (Primary) - AMB REFERRAL FOR DME - Ambulatory Referral for DME  2. REM sleep behavior disorder  Assessment and Plan    Obstructive Sleep Apnea Persistent obstructive sleep apnea with residual apneic events despite 100% CPAP usage. Current settings are on auto with pressures ranging from 5 to 20 cm H2O. Average pressure at the 95th percentile is 10.5 cm H2O with approximately 9 apneic events per hour. Potential contributing factors include a slight weight gain of 5 pounds and possible equipment issues with CPAP parts. No significant air leaks detected in the CPAP download. Adjusting pressure settings from auto to a set pressure is recommended to reduce apneic events. - Adjust CPAP settings from auto to a set pressure 12cm h20. - Coordinate with a new medical supply store for CPAP supplies. - Replace CPAP mask monthly, tubing every three months, headgear every six months, water chamber every six months, and air filter monthly. - Consider using a wedge pillow to elevate head during sleep. - Ensure CPAP machine is turned on for pressure adjustment. - Contact him if unable to adjust CPAP settings remotely.  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 Symptoms include sleep talking and acting out dreams. Clonazepam  dose was increased to 1mg  following a virtual visit on May 9th. Typical Clonazepam  dose for REM sleep disorder is between 0.5-2 mg. He reports variable symptom control with some days better  than others. Potential exacerbation due to residual apneic events from obstructive sleep apnea. Monitoring for side effects such as mental fog, mood changes, or cognitive impairment is necessary. - Maintain current clonazepam  dose and monitor symptoms. - Consider increasing clonazepam  dose if symptoms persist and he tolerates current dose without side effects. - Keep a sleep log to track frequency and severity of symptoms.   04/12/2024- Interim hx  Discussed the use of AI scribe software for clinical note transcription with the patient, who gave verbal consent to proceed.  History of Present Illness   Stuart Harper is a 28 year old male with obstructive sleep apnea and REM sleep behavior disorder who presents for a follow-up regarding CPAP usage and symptom management.  He has moderate obstructive sleep apnea, confirmed by a sleep study on January 15, 2020, with 22.9 apneic events per hour and a low SpO2 of 82%. He consistently uses a CPAP machine and recently switched to a new Luna G3 model this month. Denies waking up gasping, or choking during sleep.  He manages REM sleep behavior disorder, characterized by sleep talking and acting out dreams. Initially, melatonin was ineffective, and he is now on clonazepam , increased to 1 mg at bedtime. He notes partial improvement with a decrease in nightmares and night terrors. Over the past month, he has not woken up screaming or thrashed in bed, though nightmares and sleep talking persist. He journals symptoms daily to track changes.  He has experienced issues with a new CPAP mask that covers both his nose and mouth, leading to error codes due to air leaks, likely caused by facial hair. He reverted to using a nasal mask, which previously showed minimal air leaks and controlled events.     Observations/Objective:  Appears well without overt respiratory symptoms  Assessment and Plan:  Assessment and Plan    Obstructive Sleep Apnea Moderate  obstructive sleep apnea diagnosed via sleep study on January 15, 2020, with 22.9 apneic events per hour and SpO2 low of 82%. Currently using a Luna G3 CPAP machine. No residual apneic events, gasping, or choking reported. Recent change in CPAP machine and mask may affect compliance data. - Obtain CPAP compliance data download from [facility]. - Schedule follow-up visit in mid to late August to ensure 30 days of compliance data. - Consider using a chin strap to improve mask seal if needed.  REM Sleep Behavior Disorder REM sleep behavior disorder with symptoms of sleep talking and acting out dreams. Partial improvement noted with clonazepam  1 mg, but still experiencing night terrors and nightmares. Considering increasing clonazepam  to 2 mg for better symptom control. Discussed potential side effects of increased dose, including drowsiness and grogginess, and plan to adjust dose if side effects occur. He is open to trying the increased dose and will monitor for side effects. - Increase clonazepam  to 2 mg at bedtime. - Provide a 73-month supply of clonazepam  with instructions to adjust dose if side effects occur.  Follow Up Instructions:   1 month for CPAP compliance, received new luna CPAP machine in July  I discussed the assessment and treatment plan with the patient. The patient was provided an opportunity to ask questions and all  were answered. The patient agreed with the plan and demonstrated an understanding of the instructions.   The patient was advised to call back or seek an in-person evaluation if the symptoms worsen or if the condition fails to improve as anticipated.  I provided 22 minutes of non-face-to-face time during this encounter.   Almarie LELON Ferrari, NP

## 2024-04-19 NOTE — Telephone Encounter (Signed)
 Called PT no answer left VM.

## 2024-04-19 NOTE — Telephone Encounter (Signed)
 This looks like this was already fixed back in May 2025. Routing to the front desk for scheduling.

## 2024-04-22 ENCOUNTER — Telehealth: Payer: Self-pay | Admitting: Primary Care

## 2024-04-22 ENCOUNTER — Other Ambulatory Visit: Payer: Self-pay | Admitting: Urology

## 2024-04-22 NOTE — Telephone Encounter (Signed)
 Attempted to call patient to schedule cpap compliance follow up. Left voicemail to call office back.

## 2024-04-24 ENCOUNTER — Telehealth: Payer: Self-pay

## 2024-04-24 NOTE — Telephone Encounter (Signed)
 CMN received from [facility] regarding CPAP supplies. The CMN was already pre filled out and just requires a signature from the provider. I will also print the lov notes to fax back with this.

## 2024-04-25 NOTE — Telephone Encounter (Signed)
 CMN has been faxed and was was received. NFN

## 2024-04-26 ENCOUNTER — Ambulatory Visit: Admitting: Primary Care

## 2024-04-30 ENCOUNTER — Ambulatory Visit: Payer: MEDICAID | Admitting: Urology

## 2024-05-07 DIAGNOSIS — F4312 Post-traumatic stress disorder, chronic: Secondary | ICD-10-CM | POA: Diagnosis not present

## 2024-05-07 DIAGNOSIS — F331 Major depressive disorder, recurrent, moderate: Secondary | ICD-10-CM | POA: Diagnosis not present

## 2024-05-07 DIAGNOSIS — F411 Generalized anxiety disorder: Secondary | ICD-10-CM | POA: Diagnosis not present

## 2024-05-14 ENCOUNTER — Ambulatory Visit: Admitting: Endocrinology

## 2024-05-16 DIAGNOSIS — Z79899 Other long term (current) drug therapy: Secondary | ICD-10-CM | POA: Diagnosis not present

## 2024-05-16 DIAGNOSIS — F411 Generalized anxiety disorder: Secondary | ICD-10-CM | POA: Diagnosis not present

## 2024-05-16 DIAGNOSIS — F32A Depression, unspecified: Secondary | ICD-10-CM | POA: Diagnosis not present

## 2024-05-16 DIAGNOSIS — F6089 Other specific personality disorders: Secondary | ICD-10-CM | POA: Diagnosis not present

## 2024-05-30 DIAGNOSIS — F1124 Opioid dependence with opioid-induced mood disorder: Secondary | ICD-10-CM | POA: Diagnosis not present

## 2024-05-30 DIAGNOSIS — F331 Major depressive disorder, recurrent, moderate: Secondary | ICD-10-CM | POA: Diagnosis not present

## 2024-05-30 DIAGNOSIS — F411 Generalized anxiety disorder: Secondary | ICD-10-CM | POA: Diagnosis not present

## 2024-06-04 ENCOUNTER — Other Ambulatory Visit
Admission: RE | Admit: 2024-06-04 | Discharge: 2024-06-04 | Disposition: A | Discharge status: Home / Self Care | Attending: Urology | Admitting: Urology

## 2024-06-04 DIAGNOSIS — E291 Testicular hypofunction: Secondary | ICD-10-CM | POA: Insufficient documentation

## 2024-06-05 LAB — TESTOSTERONE: Testosterone: 510 ng/dL (ref 264–916)

## 2024-06-06 ENCOUNTER — Ambulatory Visit (INDEPENDENT_AMBULATORY_CARE_PROVIDER_SITE_OTHER): Admitting: Urology

## 2024-06-06 VITALS — BP 123/82 | HR 71 | Wt 200.0 lb

## 2024-06-06 DIAGNOSIS — E291 Testicular hypofunction: Secondary | ICD-10-CM

## 2024-06-06 DIAGNOSIS — N529 Male erectile dysfunction, unspecified: Secondary | ICD-10-CM | POA: Diagnosis not present

## 2024-06-06 MED ORDER — TADALAFIL 5 MG PO TABS: 5.0000 mg | ORAL_TABLET | Freq: Every day | ORAL | 11 refills | Status: AC | PRN

## 2024-06-06 NOTE — Progress Notes (Signed)
   06/06/2024 9:51 AM   Stuart Harper [birth year] [id number]  Reason for visit: Follow up hypogonadism ED  History: Seen for low testosterone  levels and fatigue/low energy/ED originally June 2025 Testosterone  47 April 2025, 1 02 March 2024.  Prior level normal 457 from 2019 Bipolar 1 disorder, OCD, on multiple psych medications ED Opted for trial of Clomid  and Cialis  at visit from June 2025  Physical Exam: BP 123/82 (BP Location: Left Arm, Patient Position: Sitting, Cuff Size: Normal)   Pulse 71   Wt 200 lb (90.7 kg)   SpO2 97%   BMI 27.12 kg/m   Imaging/labs: Most recent testosterone  from September 2025 increased to 510 on Clomid   Today: He denies any significant change in his overall mood or symptoms, we discussed his other multiple medications that likely contribute to his symptomology.  Reassurance provided regarding normal testosterone  value He did not fill the Cialis , has not tried that medication yet  Plan:   Hypogonadism: Recommended continuing Clomid  for at least another 2 to 3 months to see if his symptoms improve, as his testosterone  levels have improved significantly.  If no improvement in symptoms after 2 to 3 months would consider discontinuing Clomid  and looking for other etiologies of his symptoms ED: Cialis  resent to [email or web address], risks and benefits discussed RTC 3 months testosterone  prior   Stuart JAYSON Burnet, MD  [facility] [address] [place], [place] [address] [phone]

## 2024-06-06 NOTE — Patient Instructions (Addendum)
 Your Cialis  prescription was sent to [email or web address], you can go to the website and set up an account and they will mail you your medications  Please stop by the lab here in [place] located in [address] to have your testosterone  checked

## 2024-06-13 DIAGNOSIS — F411 Generalized anxiety disorder: Secondary | ICD-10-CM | POA: Diagnosis not present

## 2024-06-13 DIAGNOSIS — Z79899 Other long term (current) drug therapy: Secondary | ICD-10-CM | POA: Diagnosis not present

## 2024-06-13 DIAGNOSIS — F1124 Opioid dependence with opioid-induced mood disorder: Secondary | ICD-10-CM | POA: Diagnosis not present

## 2024-06-13 DIAGNOSIS — F32A Depression, unspecified: Secondary | ICD-10-CM | POA: Diagnosis not present

## 2024-06-13 DIAGNOSIS — F6089 Other specific personality disorders: Secondary | ICD-10-CM | POA: Diagnosis not present

## 2024-06-14 ENCOUNTER — Ambulatory Visit: Admitting: Primary Care

## 2024-06-14 DIAGNOSIS — G4733 Obstructive sleep apnea (adult) (pediatric): Secondary | ICD-10-CM

## 2024-06-14 DIAGNOSIS — J45909 Unspecified asthma, uncomplicated: Secondary | ICD-10-CM

## 2024-07-16 DIAGNOSIS — F32A Depression, unspecified: Secondary | ICD-10-CM | POA: Diagnosis not present

## 2024-07-16 DIAGNOSIS — F411 Generalized anxiety disorder: Secondary | ICD-10-CM | POA: Diagnosis not present

## 2024-07-16 DIAGNOSIS — F6089 Other specific personality disorders: Secondary | ICD-10-CM | POA: Diagnosis not present

## 2024-07-16 DIAGNOSIS — Z79899 Other long term (current) drug therapy: Secondary | ICD-10-CM | POA: Diagnosis not present

## 2024-07-18 ENCOUNTER — Telehealth: Payer: Self-pay

## 2024-07-18 NOTE — Telephone Encounter (Signed)
 CMN received from [facility] equipment regarding pts CPAP supplies and necessity of it. This only requesting a providers signature. I will fax once this has been signed.

## 2024-07-22 ENCOUNTER — Other Ambulatory Visit: Payer: Self-pay | Admitting: Primary Care

## 2024-07-23 NOTE — Telephone Encounter (Signed)
 This has been signed and faxed. NFN

## 2024-08-05 ENCOUNTER — Encounter: Payer: Self-pay | Admitting: Urology

## 2024-08-23 ENCOUNTER — Other Ambulatory Visit: Payer: Self-pay

## 2024-08-23 DIAGNOSIS — E291 Testicular hypofunction: Secondary | ICD-10-CM

## 2024-08-29 ENCOUNTER — Other Ambulatory Visit

## 2024-09-02 ENCOUNTER — Other Ambulatory Visit: Payer: Self-pay | Admitting: Primary Care

## 2024-09-05 ENCOUNTER — Ambulatory Visit: Admitting: Urology

## 2024-09-23 ENCOUNTER — Other Ambulatory Visit: Payer: Self-pay | Admitting: Nurse Practitioner

## 2024-09-23 DIAGNOSIS — B001 Herpesviral vesicular dermatitis: Secondary | ICD-10-CM

## 2024-09-23 NOTE — Telephone Encounter (Signed)
 Copied from CRM [id number]. Topic: Clinical - Medication Refill >> Sep 23, 2024  3:51 PM Chasity T wrote: Medication: valACYclovir  (VALTREX ) 1000 MG tablet   Has the patient contacted their pharmacy? Yes  This is the patient's preferred pharmacy:    [facility] [address], [place] - [address] [place] [address] Phone: [phone] Fax: [phone]  Is this the correct pharmacy for this prescription? Yes If no, delete pharmacy and type the correct one.   Has the prescription been filled recently? No  Is the patient out of the medication? Yes  Has the patient been seen for an appointment in the last year OR does the patient have an upcoming appointment? Yes  Can we respond through MyChart? Yes  Agent: Please be advised that Rx refills may take up to 3 business days. We ask that you follow-up with your pharmacy.

## 2024-09-30 ENCOUNTER — Ambulatory Visit: Admitting: Internal Medicine

## 2024-09-30 ENCOUNTER — Encounter: Payer: Self-pay | Admitting: Internal Medicine

## 2024-09-30 VITALS — BP 126/82 | HR 93 | Temp 98.5°F | Ht 72.0 in | Wt 212.8 lb

## 2024-09-30 DIAGNOSIS — B001 Herpesviral vesicular dermatitis: Secondary | ICD-10-CM | POA: Diagnosis not present

## 2024-09-30 DIAGNOSIS — G5711 Meralgia paresthetica, right lower limb: Secondary | ICD-10-CM | POA: Diagnosis not present

## 2024-09-30 MED ORDER — VALACYCLOVIR HCL 1 G PO TABS: 1000.0000 mg | ORAL_TABLET | Freq: Two times a day (BID) | ORAL | 2 refills | Status: AC

## 2024-09-30 MED ORDER — GABAPENTIN 300 MG PO CAPS: 300.0000 mg | ORAL_CAPSULE | Freq: Every day | ORAL | 0 refills | Status: AC

## 2024-09-30 NOTE — Assessment & Plan Note (Signed)
-   This problem is chronic and stable -Will continue with as needed Valtrex  -Refill sent in today to use for breakouts -No further workup at this time

## 2024-09-30 NOTE — Assessment & Plan Note (Signed)
-   Patient complains of tingling numbness and shooting pain over the lateral aspect of his right thigh extending up to his knee -This is been occurring for the last year and a half -This problem is stable but constant -Patient has put on approximately 10 pounds over the last year -Patient states his symptoms are worse at night when laying down -On exam, patient has diminished sensation over his right lateral thigh but sensation is otherwise intact.  Strength is intact in bilateral lower extremities - I suspect patient likely has meralgia paresthetica -Patient advised on wearing looser clothing and weight loss through diet and exercise -Gabapentin  300 mg nightly prescribed to help with his nighttime symptoms -If symptoms do persist despite these measures at his follow-up appointment with his PCP he will ask for referral to neurology -No further workup at this time

## 2024-09-30 NOTE — Progress Notes (Signed)
 "  Acute Office Visit  Subjective:     Patient ID: Stuart Harper, male    DOB: 18-Oct-1995, 29 y.o.   MRN: [id number]  Chief Complaint  Patient presents with   Acute Visit    Intermittent right leg pain, tingling and numbness x 1 year Pain 7/10  Pain from right hip down into right knee - getting worse    Discussed the use of AI scribe software for clinical note transcription with the patient, who gave verbal consent to proceed.  History of Present Illness Stuart Harper Stuart Harper is a 29 year old male who presents with tingling and numbness in the side of his leg.  Lateral leg paresthesia and pain - Tingling and numbness localized to the outer side of the leg from the knee up, present for approximately 1.5 years - Symptoms initially appeared at night while lying in bed, particularly in certain positions - Dull pain with tactile stimulation, most pronounced at night - Occasional sharp pain in the same area - Worst pain occurs when lying down at night; some relief with positional adjustment in bed - No associated back or buttock pain - No sciatica-like symptoms  Local trauma and skin changes - History of a large bruise in the affected area that persisted for about a year - Area previously associated with a cyst, which has since resolved - No recent trauma to the area - Symptoms have persisted despite cyst resolution  Mechanical and external factors - Weight gain of approximately 10-15 pounds over the past year - Adjustment of belt loop due to weight gain - Typically wears jeans once or twice a week; often wears sweatpants - Elastic bands from clothing may contribute to discomfort - Carrying keys in pocket may exacerbate symptoms  Prior treatments - History of gabapentin  use about 10 years ago for tendon fasciitis, with minimal relief - Not currently taking any medication for this issue    Review of Systems  Constitutional: Negative.   HENT: Negative.     Respiratory: Negative.    Cardiovascular: Negative.   Gastrointestinal: Negative.   Musculoskeletal: Negative.   Neurological:  Positive for tingling and sensory change. Negative for focal weakness and weakness.  Psychiatric/Behavioral: Negative.          Objective:    BP 126/82   Pulse 93   Temp 98.5 F (36.9 C)   Ht 6' (1.829 m)   Wt 212 lb 12.8 oz (96.5 kg)   SpO2 96%   BMI 28.86 kg/m    Physical Exam Constitutional:      Appearance: Normal appearance.  HENT:     Head: Normocephalic and atraumatic.  Cardiovascular:     Rate and Rhythm: Normal rate and regular rhythm.     Heart sounds: Normal heart sounds.  Pulmonary:     Effort: Pulmonary effort is normal.     Breath sounds: Normal breath sounds. No wheezing, rhonchi or rales.  Abdominal:     General: Bowel sounds are normal. There is no distension.     Palpations: Abdomen is soft.     Tenderness: There is no abdominal tenderness. There is no guarding or rebound.  Musculoskeletal:        General: No swelling or tenderness.     Right lower leg: No edema.     Left lower leg: No edema.  Skin:    Findings: No bruising, erythema or rash.  Neurological:     Mental Status: He is alert.  Comments: Diminished sensation over his right lateral thigh.  Sensation is otherwise intact.  Power 5 out of 5 bilateral lower extremities.  Psychiatric:        Mood and Affect: Mood normal.        Behavior: Behavior normal.     No results found for any visits on 09/30/24.      Assessment & Plan:   Problem List Items Addressed This Visit       Digestive   Cold sore   - This problem is chronic and stable -Will continue with as needed Valtrex  -Refill sent in today to use for breakouts -No further workup at this time      Relevant Medications   valACYclovir  (VALTREX ) 1000 MG tablet     Nervous and Auditory   Meralgia paresthetica of right side - Primary   - Patient complains of tingling numbness and shooting  pain over the lateral aspect of his right thigh extending up to his knee -This is been occurring for the last year and a half -This problem is stable but constant -Patient has put on approximately 10 pounds over the last year -Patient states his symptoms are worse at night when laying down -On exam, patient has diminished sensation over his right lateral thigh but sensation is otherwise intact.  Strength is intact in bilateral lower extremities - I suspect patient likely has meralgia paresthetica -Patient advised on wearing looser clothing and weight loss through diet and exercise -Gabapentin  300 mg nightly prescribed to help with his nighttime symptoms -If symptoms do persist despite these measures at his follow-up appointment with his PCP he will ask for referral to neurology -No further workup at this time      Relevant Medications   gabapentin  (NEURONTIN ) 300 MG capsule    Meds ordered this encounter  Medications   valACYclovir  (VALTREX ) 1000 MG tablet    Sig: Take 1 tablet (1,000 mg total) by mouth 2 (two) times daily. X 1 day. Use as needed for breakouts, start ASAP after symptom onset.    Dispense:  30 tablet    Refill:  2   gabapentin  (NEURONTIN ) 300 MG capsule    Sig: Take 1 capsule (300 mg total) by mouth at bedtime.    Dispense:  90 capsule    Refill:  0    No follow-ups on file.  Stuart Victory, MD   "

## 2024-09-30 NOTE — Patient Instructions (Addendum)
" °  VISIT SUMMARY: During your visit, we discussed the tingling and numbness you have been experiencing in the side of your leg. This has been ongoing for about 1.5 years and is most pronounced at night. We reviewed your history, including past treatments and potential contributing factors such as weight gain and clothing choices.  YOUR PLAN: -MERALGIA PARESTHETICA: Meralgia paresthetica is a condition caused by compression of the lateral femoral cutaneous nerve, leading to tingling, numbness, and pain in the outer thigh. To manage your symptoms, I have prescribed gabapentin  300 mg to be taken at night. Additionally, I recommend wearing looser clothing and working on weight loss through diet and exercise. If your symptoms do not improve, we may consider physical therapy. If the issue persists, I will refer you to a neurologist for nerve conduction studies.  INSTRUCTIONS: Please take gabapentin  300 mg at night as prescribed. Wear looser clothing to avoid compressing the nerve. Focus on weight loss through a healthy diet and regular exercise. If your symptoms do not improve, we will consider physical therapy. Should the symptoms persist at your follow-up appointment with your PCP consider neurology referral for further evaluation                      Contains text generated by [employer]bridge.                                 Contains text generated by [employer]bridge.   "

## 2024-10-02 ENCOUNTER — Other Ambulatory Visit: Payer: Self-pay | Admitting: Primary Care

## 2024-11-15 ENCOUNTER — Ambulatory Visit: Admitting: Primary Care

## 2024-11-22 ENCOUNTER — Encounter: Admitting: Nurse Practitioner
